# Patient Record
Sex: Male | Born: 1965 | Race: Black or African American | Hispanic: No | Marital: Single | Smoking: Current every day smoker
Health system: Southern US, Community
[De-identification: ages and names within clinical notes are randomized; demographics above are authoritative.]

## PROBLEM LIST (undated history)

## (undated) DIAGNOSIS — K5792 Diverticulitis of intestine, part unspecified, without perforation or abscess without bleeding: Secondary | ICD-10-CM

## (undated) DIAGNOSIS — F329 Major depressive disorder, single episode, unspecified: Secondary | ICD-10-CM

## (undated) DIAGNOSIS — E785 Hyperlipidemia, unspecified: Secondary | ICD-10-CM

## (undated) DIAGNOSIS — M719 Bursopathy, unspecified: Secondary | ICD-10-CM

## (undated) DIAGNOSIS — I10 Essential (primary) hypertension: Secondary | ICD-10-CM

## (undated) DIAGNOSIS — F319 Bipolar disorder, unspecified: Secondary | ICD-10-CM

## (undated) DIAGNOSIS — E119 Type 2 diabetes mellitus without complications: Secondary | ICD-10-CM

## (undated) DIAGNOSIS — M1990 Unspecified osteoarthritis, unspecified site: Secondary | ICD-10-CM

## (undated) DIAGNOSIS — F29 Unspecified psychosis not due to a substance or known physiological condition: Secondary | ICD-10-CM

## (undated) DIAGNOSIS — F32A Depression, unspecified: Secondary | ICD-10-CM

## (undated) HISTORY — DX: Unspecified osteoarthritis, unspecified site: M19.90

## (undated) HISTORY — DX: Bursopathy, unspecified: M71.9

## (undated) HISTORY — DX: Diverticulitis of intestine, part unspecified, without perforation or abscess without bleeding: K57.92

## (undated) HISTORY — DX: Bipolar disorder, unspecified: F31.9

## (undated) HISTORY — DX: Unspecified psychosis not due to a substance or known physiological condition: F29

## (undated) HISTORY — DX: Major depressive disorder, single episode, unspecified: F32.9

## (undated) HISTORY — DX: Depression, unspecified: F32.A

---

## 2015-07-26 DIAGNOSIS — K5730 Diverticulosis of large intestine without perforation or abscess without bleeding: Secondary | ICD-10-CM | POA: Insufficient documentation

## 2015-12-18 DIAGNOSIS — Z2821 Immunization not carried out because of patient refusal: Secondary | ICD-10-CM | POA: Insufficient documentation

## 2017-04-23 DIAGNOSIS — E1110 Type 2 diabetes mellitus with ketoacidosis without coma: Secondary | ICD-10-CM

## 2017-04-23 DIAGNOSIS — N179 Acute kidney failure, unspecified: Secondary | ICD-10-CM

## 2017-04-23 DIAGNOSIS — I10 Essential (primary) hypertension: Secondary | ICD-10-CM

## 2017-05-02 ENCOUNTER — Other Ambulatory Visit: Payer: Self-pay

## 2017-05-02 ENCOUNTER — Encounter: Payer: Self-pay | Admitting: Emergency Medicine

## 2017-05-02 ENCOUNTER — Emergency Department
Admission: EM | Admit: 2017-05-02 | Discharge: 2017-05-02 | Disposition: A | Discharge status: Home / Self Care | Payer: No Typology Code available for payment source | Attending: Emergency Medicine | Admitting: Emergency Medicine

## 2017-05-02 DIAGNOSIS — E119 Type 2 diabetes mellitus without complications: Secondary | ICD-10-CM | POA: Diagnosis not present

## 2017-05-02 DIAGNOSIS — Y9389 Activity, other specified: Secondary | ICD-10-CM | POA: Insufficient documentation

## 2017-05-02 DIAGNOSIS — F17200 Nicotine dependence, unspecified, uncomplicated: Secondary | ICD-10-CM | POA: Diagnosis not present

## 2017-05-02 DIAGNOSIS — M7918 Myalgia, other site: Secondary | ICD-10-CM | POA: Insufficient documentation

## 2017-05-02 DIAGNOSIS — Y92410 Unspecified street and highway as the place of occurrence of the external cause: Secondary | ICD-10-CM | POA: Insufficient documentation

## 2017-05-02 DIAGNOSIS — I10 Essential (primary) hypertension: Secondary | ICD-10-CM | POA: Insufficient documentation

## 2017-05-02 DIAGNOSIS — Y999 Unspecified external cause status: Secondary | ICD-10-CM | POA: Diagnosis not present

## 2017-05-02 DIAGNOSIS — S161XXA Strain of muscle, fascia and tendon at neck level, initial encounter: Secondary | ICD-10-CM | POA: Diagnosis not present

## 2017-05-02 DIAGNOSIS — S39012A Strain of muscle, fascia and tendon of lower back, initial encounter: Secondary | ICD-10-CM | POA: Diagnosis not present

## 2017-05-02 DIAGNOSIS — S1980XA Other specified injuries of unspecified part of neck, initial encounter: Secondary | ICD-10-CM | POA: Diagnosis present

## 2017-05-02 HISTORY — DX: Hyperlipidemia, unspecified: E78.5

## 2017-05-02 HISTORY — DX: Type 2 diabetes mellitus without complications: E11.9

## 2017-05-02 HISTORY — DX: Essential (primary) hypertension: I10

## 2017-05-02 MED ORDER — TRAMADOL HCL 50 MG PO TABS: 50.0000 mg | ORAL_TABLET | Freq: Four times a day (QID) | ORAL | 0 refills | Status: DC | PRN

## 2017-05-02 MED ORDER — CYCLOBENZAPRINE HCL 10 MG PO TABS: 10.0000 mg | ORAL_TABLET | Freq: Three times a day (TID) | ORAL | 0 refills | Status: DC | PRN

## 2017-05-02 MED ORDER — IBUPROFEN 600 MG PO TABS: 600.0000 mg | ORAL_TABLET | Freq: Three times a day (TID) | ORAL | 0 refills | Status: DC | PRN
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
## 2017-07-16 ENCOUNTER — Ambulatory Visit: Payer: Self-pay | Admitting: Family Medicine

## 2017-07-16 VITALS — BP 138/86 | HR 97 | Temp 97.4°F | Wt 273.8 lb

## 2017-07-16 DIAGNOSIS — E782 Mixed hyperlipidemia: Secondary | ICD-10-CM

## 2017-07-16 DIAGNOSIS — E1142 Type 2 diabetes mellitus with diabetic polyneuropathy: Secondary | ICD-10-CM

## 2017-07-16 DIAGNOSIS — M1991 Primary osteoarthritis, unspecified site: Secondary | ICD-10-CM

## 2017-07-16 DIAGNOSIS — I10 Essential (primary) hypertension: Secondary | ICD-10-CM

## 2017-07-16 LAB — GLUCOSE, POCT (MANUAL RESULT ENTRY): POC Glucose: 232 mg/dl — AB (ref 70–99)

## 2017-07-16 MED ORDER — METFORMIN HCL 1000 MG PO TABS: 500.0000 mg | ORAL_TABLET | Freq: Two times a day (BID) | ORAL | 5 refills | Status: DC

## 2017-07-16 MED ORDER — LOSARTAN POTASSIUM 100 MG PO TABS: 100.0000 mg | ORAL_TABLET | Freq: Every day | ORAL | 5 refills | Status: DC

## 2017-07-16 MED ORDER — GLUCOSE BLOOD VI STRP: ORAL_STRIP | 12 refills | Status: DC

## 2017-07-16 MED ORDER — INSULIN ASPART PROT & ASPART (70-30 MIX) 100 UNIT/ML ~~LOC~~ SUSP: 8.0000 [IU] | Freq: Every day | SUBCUTANEOUS | Status: DC

## 2017-07-16 MED ORDER — HYDROCHLOROTHIAZIDE 25 MG PO TABS
25.0000 mg | ORAL_TABLET | Freq: Two times a day (BID) | ORAL | 5 refills | Status: DC
Start: 2017-07-16 — End: 2017-11-20

## 2017-07-16 MED ORDER — ATORVASTATIN CALCIUM 10 MG PO TABS: 10.0000 mg | ORAL_TABLET | Freq: Every day | ORAL | 5 refills | Status: DC

## 2017-07-16 MED ORDER — METOPROLOL TARTRATE 50 MG PO TABS
50.0000 mg | ORAL_TABLET | Freq: Two times a day (BID) | ORAL | 5 refills | Status: DC
Start: 2017-07-16 — End: 2017-11-20

## 2017-07-16 MED ORDER — INSULIN GLARGINE 100 UNIT/ML ~~LOC~~ SOLN: 65.0000 [IU] | Freq: Every day | SUBCUTANEOUS | Status: DC

## 2017-07-16 MED ORDER — AMLODIPINE BESYLATE 10 MG PO TABS: 10.0000 mg | ORAL_TABLET | Freq: Every day | ORAL | 5 refills | Status: DC

## 2017-07-16 NOTE — Addendum Note (Signed)
Addended by: Orvis BrillGREEN, Kayven Aldaco M on: 07/16/2017 07:47 PM   Modules accepted: Orders

## 2017-07-16 NOTE — Progress Notes (Signed)
   Subjective:    Patient ID: Lance Vaughan, male    DOB: 01/16/1966, 52 y.o.   MRN: [id number]  HPI 52 yo AA male just got out of prison--needs refill on chronic meds for TIIDM,HTN HLD and OA of back,hips,knees,shoulder. Wlks with cane.Also needs test strips.   Review of Systems Pain from OA.   On Insulin for 3 months. Recent neuropathy of feet this month.  Objective:   Physical Exam  T--97  Wt 232lbs  Bp -138/86  HR--72  HEENT--WNL COR--RRR Lungs--clear Ext--no edema  .    Assessment & Plan:  TIIDM HTN HLD OA Depression  Get labs,meds refilled for 6 months,RTC 1 months to review labs,BS log. Hope to cut down or get off of insulin if possible.  I have done the exam and reviewed the chart and it is accurate to the best of my knowledge. DentistDragon  technology has been used and  any errors in dictation or transcription are unintentional. Julieanne Mansonichard  M.D. [facility]

## 2017-07-21 ENCOUNTER — Ambulatory Visit: Payer: Self-pay | Admitting: Pharmacy Technician

## 2017-07-21 DIAGNOSIS — Z79899 Other long term (current) drug therapy: Secondary | ICD-10-CM

## 2017-07-21 NOTE — Progress Notes (Signed)
Completed [facility] application and contract.  Patient agreed to all terms of the [facility] contract.   Patient approved to receive medication assistance at [facility] through 2019, as long as eligibility criteria continues to be met.   Provided patient with Civil engineer, contracting based on his particular needs.    Lantus Prescription Application completed with patient.  Sent to [facility] for signature.  Upon receipt of signed Lantus PAP Application from provider, the Lantus PAP Application will be submitted to [employer].  [place] [facility]

## 2017-07-22 ENCOUNTER — Other Ambulatory Visit: Payer: Medicaid Other

## 2017-07-22 ENCOUNTER — Telehealth: Payer: Self-pay

## 2017-07-22 DIAGNOSIS — E119 Type 2 diabetes mellitus without complications: Secondary | ICD-10-CM

## 2017-07-22 NOTE — Telephone Encounter (Signed)
Entered in error

## 2017-07-23 LAB — CBC WITH DIFFERENTIAL
BASOS: 1 %
Basophils Absolute: 0 10*3/uL (ref 0.0–0.2)
EOS (ABSOLUTE): 0.3 10*3/uL (ref 0.0–0.4)
EOS: 6 %
HEMATOCRIT: 44.7 % (ref 37.5–51.0)
Hemoglobin: 15.6 g/dL (ref 13.0–17.7)
IMMATURE GRANS (ABS): 0 10*3/uL (ref 0.0–0.1)
Immature Granulocytes: 0 %
LYMPHS: 40 %
Lymphocytes Absolute: 1.8 10*3/uL (ref 0.7–3.1)
MCH: 32.6 pg (ref 26.6–33.0)
MCHC: 34.9 g/dL (ref 31.5–35.7)
MCV: 93 fL (ref 79–97)
MONOS ABS: 0.3 10*3/uL (ref 0.1–0.9)
Monocytes: 6 %
NEUTROS ABS: 2.1 10*3/uL (ref 1.4–7.0)
Neutrophils: 47 %
RBC: 4.79 x10E6/uL (ref 4.14–5.80)
RDW: 13.6 % (ref 12.3–15.4)
WBC: 4.4 10*3/uL (ref 3.4–10.8)

## 2017-07-23 LAB — COMPREHENSIVE METABOLIC PANEL
ALK PHOS: 70 IU/L (ref 39–117)
ALT: 56 IU/L — ABNORMAL HIGH (ref 0–44)
AST: 30 IU/L (ref 0–40)
Albumin/Globulin Ratio: 1.8 (ref 1.2–2.2)
Albumin: 4.2 g/dL (ref 3.5–5.5)
BILIRUBIN TOTAL: 0.3 mg/dL (ref 0.0–1.2)
BUN / CREAT RATIO: 9 (ref 9–20)
BUN: 10 mg/dL (ref 6–24)
CHLORIDE: 102 mmol/L (ref 96–106)
CO2: 21 mmol/L (ref 20–29)
CREATININE: 1.07 mg/dL (ref 0.76–1.27)
Calcium: 9.1 mg/dL (ref 8.7–10.2)
GFR calc Af Amer: 92 mL/min/{1.73_m2} (ref >59)
GFR calc non Af Amer: 79 mL/min/{1.73_m2} (ref >59)
GLUCOSE: 215 mg/dL — AB (ref 65–99)
Globulin, Total: 2.4 g/dL (ref 1.5–4.5)
Potassium: 4.5 mmol/L (ref 3.5–5.2)
Sodium: 141 mmol/L (ref 134–144)
Total Protein: 6.6 g/dL (ref 6.0–8.5)

## 2017-07-23 LAB — LIPID PANEL
CHOLESTEROL TOTAL: 133 mg/dL (ref 100–199)
Chol/HDL Ratio: 3.7 ratio (ref 0.0–5.0)
HDL: 36 mg/dL — ABNORMAL LOW (ref >39)
LDL CALC: 48 mg/dL (ref 0–99)
TRIGLYCERIDES: 243 mg/dL — AB (ref 0–149)
VLDL CHOLESTEROL CAL: 49 mg/dL — AB (ref 5–40)

## 2017-07-23 LAB — HEMOGLOBIN A1C
Est. average glucose Bld gHb Est-mCnc: 220 mg/dL
Hgb A1c MFr Bld: 9.3 % — ABNORMAL HIGH (ref 4.8–5.6)

## 2017-07-23 LAB — TSH: TSH: 1.19 u[IU]/mL (ref 0.450–4.500)

## 2017-07-24 ENCOUNTER — Telehealth: Payer: Self-pay

## 2017-07-24 NOTE — Telephone Encounter (Signed)
error 

## 2017-08-03 ENCOUNTER — Other Ambulatory Visit: Payer: Self-pay

## 2017-08-03 ENCOUNTER — Telehealth: Payer: Self-pay | Admitting: Pharmacist

## 2017-08-03 ENCOUNTER — Ambulatory Visit: Payer: Medicaid Other | Admitting: Pharmacist

## 2017-08-03 ENCOUNTER — Other Ambulatory Visit: Payer: Self-pay | Admitting: Urology

## 2017-08-03 VITALS — BP 134/84

## 2017-08-03 DIAGNOSIS — Z79899 Other long term (current) drug therapy: Secondary | ICD-10-CM

## 2017-08-03 NOTE — Telephone Encounter (Signed)
Called and talked to patient. Counseled on stopping Celexa and starting Zoloft. Recommended not tapering due to patient inconsistently taking Celexa the past 2 weeks.   Lance CedarStephanie Meghan Vaughan, PharmD Pharmacy Resident

## 2017-08-03 NOTE — Progress Notes (Signed)
[facility] Visit Note  Patient: Lance Vaughan MRN: [id number] Date of Birth: [birth year] PCP: Patient, No Pcp Per   Lance Vaughan 52 y.o. male presents for an initial [facility] visit today.  [facility] chief complaint of signs/symptoms of high blood sugar and shoulder/back pain. Patient brought in medication bottles and insulin pens for review. Admits to missing medication doses ~2x/week.  Patient Information   Past Medical History:  Diagnosis Date  . Depression   . Diabetes mellitus without complication (HCC)   . Diverticulitis   . Hyperlipidemia   . Hypertension   . Psychosis (HCC)      History reviewed. No pertinent surgical history.   Family History  Problem Relation Age of Onset  . Diabetes Mother   . Hypertension Mother   . Cancer Mother   . Depression Mother   . Hypertension Sister   . Hyperlipidemia Sister   . Hyperlipidemia Sister   . Hypertension Sister     Family Support: Good- Patient states has family member at home that helps keep up with his medications  Diet: Breakfast: Left overs from dinner the night before=fried chicken, spaghetti, bologna sandwhich Lunch:Same as breakfast Dinner:Same as breakfast; sometimes cheeseburgers  Drinks:water, occasional soda 1-2x/week     Current Exercise Habits: The patient does not participate in regular exercise at present due to body pain and lower extremity neuropathy.     Social History   Substance and Sexual Activity  Alcohol Use No  . Frequency: Never      Social History   Tobacco Use  Smoking Status Current Every Day Smoker  . Packs/day: 1.00  . Years: 30.00  . Pack years: 30.00  Smokeless Tobacco Never Used      Health Maintenance  Topic Date Due  . PNEUMOCOCCAL POLYSACCHARIDE VACCINE (1) 07/14/1967  . FOOT EXAM  07/14/1975  . OPHTHALMOLOGY EXAM  07/14/1975  . HIV Screening  07/13/1980  . TETANUS/TDAP  07/13/1984  . COLONOSCOPY   07/14/2015  . INFLUENZA VACCINE  01/21/2017  . HEMOGLOBIN A1C  01/19/2018    Assessment and Plan:  1. Medication Compliance: States misses ~2 doses/week of medications.   2. Diabetes Type II: Uncontrolled with most recent A1c 9.3% on 07/22/17. Current regimen=metformin, Lantus 65 units and Novolog 8 units with dinner. Patient checks BG 1-2 x/day with FBG 160-170 mg/dL and PPBG 409-811260-270 mg/dL. Counseled patient on goals of FBG 90-130 mg/dL and PPBG <914<180 mg/dL. Patient recently experiencing signs and symptoms of high BG such as blurry vision, foggy brain, neuropathy in feet, polydipsia and polyuria. Educated on importance of meeting BG goals to prevent permanent damage. Discussed importance of diet healthy lifestyle changes such as cutting back on bread and smoking cessation.--Currently working on receiving Lantus from P[facility]. Contacted [facility] about bridging with Levemir 65 units. Also recommended switching from Novolog to Apidra 8 units nightly.   3. Depression/Psychosis: Patient states was on Zoloft before prison and was transitioned to Celexa while in prison. Since leaving prison, has obtained a Zoloft prescription [facility]. Patient says Celexa did not work for him and has been inconsistently taking the last 2 weeks. Called patient on phone to stop Celexa and start taking Zoloft--did not see need to taper since patient taking inconsistently.   4. Hypertension: Current regimen=HCTZ, amlodipine, losartan, metoprolol. Patient reported BP 134/84 mmHg this AM. Patient reports checking BP at home once in a while. Says BP sometimes runs low and questioned how low  can BP go before it becomes problemtatic. Discussed with patient goal BP <140/90 mmHg and if lower and experiencing signs/symptoms of low BP to let provider know. Patient reports having 2 lows last year that resulted in passing out (~100/70 mmHg).  5. Smoking Cessation: Current 1ppd smoker for past 30 years. Would like to quit and has  previously tried nicotine gum, which did not work. Upon further investigation, patient did not use nicotine gum correctly and discussed the appropriate way to use the gum with the "park and chew" method. Provided patient with handout on smoking cessation program.    5. Hyperlipidemia: Current regimen atorvastatin 10mg  every day at night. Most recent lipid panel 07/22/17: TC 133, TG 243, HDL 36, LDL 49. Will continue on atorvastatin and life style modifications.    Will see patient for follow up [facility] visit in ~1-2 months or sooner if any questions or concerns arise.   Cleopatra Cedar, PharmD Pharmacy Resident

## 2017-08-13 ENCOUNTER — Other Ambulatory Visit: Payer: Self-pay | Admitting: Adult Health Nurse Practitioner

## 2017-08-13 MED ORDER — INSULIN DETEMIR 100 UNIT/ML FLEXPEN: 65.0000 [IU] | Freq: Every day | SUBCUTANEOUS | 3 refills | Status: DC

## 2017-08-13 MED ORDER — INSULIN GLULISINE 100 UNIT/ML IJ SOLN: 8.0000 [IU] | Freq: Every evening | INTRAMUSCULAR | 11 refills | Status: DC

## 2017-08-18 ENCOUNTER — Telehealth: Payer: Self-pay | Admitting: Pharmacist

## 2017-08-18 NOTE — Telephone Encounter (Signed)
08/18/2017 1:28:34 PM - Levemir not ordering already on Lantus  08/18/17 I received a pharmacy printout for Levemir Vials Inject 65 units under the skin at bedtime. I saw that patient is already on Lantus Solostar, I discussed with Christan-keep the order as Lantus Solostar-not ordering Levemir.AJ  08/18/2017 1:26:56 PM - Apidra Solostar  08/18/17 Received a pharmacy printout for Apidra Inject 8 units under the skin every evening before dinner, I verified with Christan to order Apidra Solostar since we are already ordering Lantus Solostar for patient, she gave the OK. Printed [employer] [facility].Forde RadonAJ

## 2017-08-20 ENCOUNTER — Encounter: Payer: Self-pay | Admitting: Adult Health Nurse Practitioner

## 2017-08-20 ENCOUNTER — Ambulatory Visit: Payer: Medicaid Other | Admitting: Adult Health Nurse Practitioner

## 2017-08-20 ENCOUNTER — Other Ambulatory Visit: Payer: Self-pay | Admitting: Adult Health Nurse Practitioner

## 2017-08-20 DIAGNOSIS — E785 Hyperlipidemia, unspecified: Secondary | ICD-10-CM | POA: Insufficient documentation

## 2017-08-20 DIAGNOSIS — E1140 Type 2 diabetes mellitus with diabetic neuropathy, unspecified: Secondary | ICD-10-CM | POA: Insufficient documentation

## 2017-08-20 DIAGNOSIS — E119 Type 2 diabetes mellitus without complications: Secondary | ICD-10-CM

## 2017-08-20 DIAGNOSIS — I10 Essential (primary) hypertension: Secondary | ICD-10-CM | POA: Insufficient documentation

## 2017-08-20 DIAGNOSIS — E782 Mixed hyperlipidemia: Secondary | ICD-10-CM

## 2017-08-20 MED ORDER — INSULIN GLULISINE 100 UNIT/ML IJ SOLN: 8.0000 [IU] | Freq: Two times a day (BID) | INTRAMUSCULAR | 11 refills | Status: DC

## 2017-08-20 MED ORDER — METFORMIN HCL 1000 MG PO TABS: 1000.0000 mg | ORAL_TABLET | Freq: Two times a day (BID) | ORAL | 5 refills | Status: DC

## 2017-08-20 NOTE — Progress Notes (Signed)
Subjective:    Patient ID: Lance Vaughan, male    DOB: 24-Jul-1965, 52 y.o.   MRN: [id number]  HPI  Lance Vaughan is a 52 yo male here for lab review and presents with severe generalized pain.  Diabetes: last A1c was 9.7. Pt is taking 1000mg  Metformin, 65 units of Lantus, 8 units of Apidra daily.Pt self reports sugars at 400-500 once a week. Pt is checking sugars 3 times/day but is running out of test strips before he can get more. Pt reports 2 times in 1 month hyperglycemia.  Pt was started on Lithium yesterday from [facility] for Bipolar disorder but pt hasn't filled Rx yet.  Hypertension: pt reports severe edema in feet which is unusual but had just ate salty foods.   There are no active problems to display for this patient.  Allergies as of 08/20/2017      Reactions   Lisinopril Other (See Comments)   Chest pain      Medication List        Accurate as of 08/20/17  7:04 PM. Always use your most recent med list.          amLODipine 10 MG tablet Commonly known as:  NORVASC Take 1 tablet (10 mg total) by mouth daily.   aspirin EC 81 MG tablet Take 81 mg by mouth daily.   atorvastatin 10 MG tablet Commonly known as:  LIPITOR Take 1 tablet (10 mg total) by mouth daily.   citalopram 10 MG tablet Commonly known as:  CELEXA Take 10 mg by mouth daily.   cyclobenzaprine 10 MG tablet Commonly known as:  FLEXERIL Take 1 tablet (10 mg total) 3 (three) times daily as needed by mouth.   glucose blood test strip Use as instructed   haloperidol 10 MG tablet Commonly known as:  HALDOL Take 10 mg by mouth 1 day or 1 dose.   hydrochlorothiazide 25 MG tablet Commonly known as:  HYDRODIURIL Take 1 tablet (25 mg total) by mouth 2 (two) times daily.   ibuprofen 600 MG tablet Commonly known as:  ADVIL,MOTRIN Take 1 tablet (600 mg total) every 8 (eight) hours as needed by mouth.   insulin detemir 100 unit/ml Soln Commonly known as:  LEVEMIR Inject 0.65 mLs (65 Units total) into the skin  at bedtime.   insulin glulisine 100 UNIT/ML injection Commonly known as:  APIDRA Inject 0.08 mLs (8 Units total) into the skin every evening. Before dinner   lithium carbonate 150 MG capsule Take 10 mg by mouth 1 day or 1 dose.   losartan 100 MG tablet Commonly known as:  COZAAR Take 1 tablet (100 mg total) by mouth daily.   metFORMIN 1000 MG tablet Commonly known as:  GLUCOPHAGE Take 0.5 tablets (500 mg total) by mouth 2 (two) times daily with a meal.   metoprolol tartrate 50 MG tablet Commonly known as:  LOPRESSOR Take 1 tablet (50 mg total) by mouth 2 (two) times daily.   sertraline 25 MG tablet Commonly known as:  ZOLOFT Take 10 mg by mouth daily.        Review of Systems Reviewed labs. LDL well controlled. A1c was 9.7.     Objective:   Physical Exam  Constitutional: He is oriented to person, place, and time. He appears well-developed and well-nourished.  Cardiovascular: Normal rate, regular rhythm and normal heart sounds.  Pulmonary/Chest: Effort normal and breath sounds normal.  Abdominal: Soft. Bowel sounds are normal.  Neurological: He is alert and oriented to person,  place, and time.  Vitals reviewed.   BP 138/90   Pulse 98   Temp 97.8 F (36.6 C)   Wt 275 lb 12.8 oz (125.1 kg)   BMI 37.41 kg/m        Assessment & Plan:   Diabetes: Increase Apidra to 8 units BID. Recommended pt check sugars twice daily. Encourage heart healthy diet and to decrease salt intake. Discouraged walking barefoot, check feet regularly.   Bipolar disorder: contacting [facility] to request change from Lithium to something else.    F/u in 3 mo w/ labs

## 2017-08-21 ENCOUNTER — Telehealth: Payer: Self-pay

## 2017-08-21 NOTE — Telephone Encounter (Signed)
Faxed signed medical release form to [facility]  Lance Vaughan wants to discuss medication therapy with dotor as it is effecting patients HTN.

## 2017-08-31 ENCOUNTER — Telehealth: Payer: Self-pay | Admitting: Pharmacist

## 2017-08-31 NOTE — Telephone Encounter (Signed)
08/31/2017 11:04:33 AM - Apidra Solostar & Lantus Solostar  08/31/17 I have faxed [employer] application for DIRECTVpidra Solostar Inject 8 units twice daily with meals #1, and Lantus Solostar Inject 65 units under the skin each morning #4, for enrollment.Forde RadonAJ

## 2017-10-29 ENCOUNTER — Emergency Department
Admission: EM | Admit: 2017-10-29 | Discharge: 2017-10-29 | Disposition: A | Discharge status: Home / Self Care | Payer: Medicaid Other | Attending: Emergency Medicine | Admitting: Emergency Medicine

## 2017-10-29 ENCOUNTER — Other Ambulatory Visit: Payer: Self-pay

## 2017-10-29 ENCOUNTER — Encounter: Payer: Self-pay | Admitting: Emergency Medicine

## 2017-10-29 DIAGNOSIS — Z7982 Long term (current) use of aspirin: Secondary | ICD-10-CM | POA: Insufficient documentation

## 2017-10-29 DIAGNOSIS — K0889 Other specified disorders of teeth and supporting structures: Secondary | ICD-10-CM | POA: Diagnosis present

## 2017-10-29 DIAGNOSIS — I10 Essential (primary) hypertension: Secondary | ICD-10-CM | POA: Insufficient documentation

## 2017-10-29 DIAGNOSIS — K047 Periapical abscess without sinus: Secondary | ICD-10-CM

## 2017-10-29 DIAGNOSIS — Z79899 Other long term (current) drug therapy: Secondary | ICD-10-CM | POA: Insufficient documentation

## 2017-10-29 DIAGNOSIS — E119 Type 2 diabetes mellitus without complications: Secondary | ICD-10-CM | POA: Diagnosis not present

## 2017-10-29 DIAGNOSIS — Z794 Long term (current) use of insulin: Secondary | ICD-10-CM | POA: Diagnosis not present

## 2017-10-29 DIAGNOSIS — F17210 Nicotine dependence, cigarettes, uncomplicated: Secondary | ICD-10-CM | POA: Diagnosis not present

## 2017-10-29 MED ORDER — AMOXICILLIN 500 MG PO CAPS: 500.0000 mg | ORAL_CAPSULE | Freq: Three times a day (TID) | ORAL | 0 refills | Status: DC

## 2017-10-29 MED ORDER — NAPROXEN 500 MG PO TABS: 500.0000 mg | ORAL_TABLET | Freq: Two times a day (BID) | ORAL | 0 refills | Status: DC

## 2017-10-29 NOTE — ED Triage Notes (Signed)
Patient here with swelling of right side of face, complaining of dental pain right lower jaw since day before yesterday.  Alert and oriented.  Afebrile.

## 2017-10-29 NOTE — ED Provider Notes (Signed)
[facility] Emergency Department Provider Note  ____________________________________________   First MD Initiated Contact with Patient 10/29/17 0815     (approximate)  I have reviewed the triage vital signs and the nursing notes.   HISTORY  Chief Complaint Abscess and Oral Swelling   HPI Lance Vaughan is a 52 y.o. male is here with complaint of right posterior molar pain.  Patient states that it began hurting yesterday.  He denies any fever or chills.  He states that [facility]  He also states that he takes his blood pressure medication every day despite his elevated blood pressure at this time.  He rates his pain as a 10/10.  Past Medical History:  Diagnosis Date  . Depression   . Diabetes mellitus without complication (HCC)   . Diverticulitis   . Hyperlipidemia   . Hypertension   . Psychosis [facility])     Patient Active Problem List   Diagnosis Date Noted  . Hyperlipidemia 08/20/2017  . Diabetes mellitus without complication (HCC) 08/20/2017  . Hypertension 08/20/2017    History reviewed. No pertinent surgical history.  Prior to Admission medications   Medication Sig Start Date End Date Taking? Authorizing Provider  amLODipine (NORVASC) 10 MG tablet Take 1 tablet (10 mg total) by mouth daily. 07/16/17   Maple Hudson., MD  amoxicillin (AMOXIL) 500 MG capsule Take 1 capsule (500 mg total) by mouth 3 (three) times daily. 10/29/17   Tommi Rumps, PA-C  aspirin EC 81 MG tablet Take 81 mg by mouth daily.    [provider]  atorvastatin (LIPITOR) 10 MG tablet Take 1 tablet (10 mg total) by mouth daily. 07/16/17   Maple Hudson., MD  citalopram (CELEXA) 10 MG tablet Take 10 mg by mouth daily.    [provider]  glucose blood test strip Use as instructed 07/16/17   Maple Hudson., MD  haloperidol (HALDOL) 10 MG tablet Take 10 mg by mouth 1 day or 1 dose.    [provider]  hydrochlorothiazide (HYDRODIURIL) 25 MG tablet Take 1 tablet (25 mg total) by mouth 2 (two) times daily. 07/16/17   Maple Hudson., MD  insulin detemir (LEVEMIR) 100 unit/ml SOLN Inject 0.65 mLs (65 Units total) into the skin at bedtime. 08/13/17   Doles-Johnson, Teah, NP  insulin glulisine (APIDRA) 100 UNIT/ML injection Inject 0.08 mLs (8 Units total) into the skin 2 (two) times daily after a meal. Before dinner 08/20/17   Doles-Johnson, Teah, NP  lithium carbonate 150 MG capsule Take 10 mg by mouth 1 day or 1 dose.    [provider]  losartan (COZAAR) 100 MG tablet Take 1 tablet (100 mg total) by mouth daily. 07/16/17   Maple Hudson., MD  metFORMIN (GLUCOPHAGE) 1000 MG tablet Take 1 tablet (1,000 mg total) by mouth 2 (two) times daily with a meal. 08/20/17   Doles-Johnson, Teah, NP  metoprolol tartrate (LOPRESSOR) 50 MG tablet Take 1 tablet (50 mg total) by mouth 2 (two) times daily. 07/16/17   Maple Hudson., MD  naproxen (NAPROSYN) 500 MG tablet Take 1 tablet (500 mg total) by mouth 2 (two) times daily with a meal. 10/29/17   Bridget Hartshorn L, PA-C  sertraline (ZOLOFT) 25 MG tablet Take 10 mg by mouth daily.    [provider]    Allergies Lisinopril  Family History  Problem Relation Age of Onset  .  Diabetes Mother   . Hypertension Mother   . Cancer Mother   . Depression Mother   . Hypertension Sister   . Hyperlipidemia Sister   . Hyperlipidemia Sister   . Hypertension Sister     Social History Social History   Tobacco Use  . Smoking status: Current Every Day Smoker    Packs/day: 1.00    Years: 30.00    Pack years: 30.00  . Smokeless tobacco: Never Used  Substance Use Topics  . Alcohol use: No    Frequency: Never  . Drug use: No    Review of Systems Constitutional: No fever/chills Eyes: No visual changes. ENT: No sore throat.  Positive dental pain. Cardiovascular: Denies chest pain. Respiratory: Denies shortness of  breath. Musculoskeletal: Negative for back pain. Neurological: Negative for headaches ____________________________________________   PHYSICAL EXAM:  VITAL SIGNS: ED Triage Vitals  Enc Vitals Group     BP 10/29/17 0804 (!) 156/106     Pulse Rate 10/29/17 0804 77     Resp 10/29/17 0804 18     Temp 10/29/17 0804 98.3 F (36.8 C)     Temp Source 10/29/17 0804 Oral     SpO2 10/29/17 0804 98 %     Weight 10/29/17 0805 272 lb (123.4 kg)     Height 10/29/17 0805 6' (1.829 m)     Head Circumference --      Peak Flow --      Pain Score 10/29/17 0805 10     Pain Loc --      Pain Edu? --      Excl. in GC? --    Constitutional: Alert and oriented. Well appearing and in no acute distress. Eyes: Conjunctivae are normal. PERRL. EOMI. Head: Atraumatic. Nose: No congestion/rhinnorhea. Mouth/Throat: Mucous membranes are moist.  Oropharynx non-erythematous.  Gums to the posterior molars is edematous and extremely tender to touch.  There is no obvious abscess noted.  Teeth are in poor hygiene. Neck: No stridor.   Cardiovascular: Normal rate, regular rhythm. Grossly normal heart sounds.  Good peripheral circulation. Respiratory: Normal respiratory effort.  No retractions. Lungs CTAB. Musculoskeletal: Moves upper and lower extremities without any difficulty.  Normal gait was noted. Neurologic:  Normal speech and language. No gross focal neurologic deficits are appreciated.  Skin:  Skin is warm, dry and intact.  Psychiatric: Mood and affect are normal. Speech and behavior are normal.  ____________________________________________   LABS (all labs ordered are listed, but only abnormal results are displayed)  Labs Reviewed - No data to display   PROCEDURES  Procedure(s) performed: None  Procedures  Critical Care performed: No  ____________________________________________   INITIAL IMPRESSION / ASSESSMENT AND PLAN / ED COURSE  As part of my medical decision making, I reviewed the  following data within the electronic MEDICAL RECORD NUMBER Notes from prior ED visits and  Controlled Substance Database  Patient is encouraged to call make an appointment with his dentist as soon as possible.  Patient was given a prescription for amoxicillin 500 mg 3 times daily for 10 days and naproxen 500 mg twice daily with food.  Patient was discharged in stable condition and encouraged to continue taking his blood pressure medication daily.  ____________________________________________   FINAL CLINICAL IMPRESSION(S) / ED DIAGNOSES  Final diagnoses:  Dental abscess     ED Discharge Orders        Ordered    amoxicillin (AMOXIL) 500 MG capsule  3 times daily     10/29/17 0857  naproxen (NAPROSYN) 500 MG tablet  2 times daily with meals     10/29/17 0857       Note:  This document was prepared using Dragon voice recognition software and may include unintentional dictation errors.    Tommi Rumps, PA-C 10/29/17 1056    Don Perking, [place], MD 10/29/17 1057

## 2017-10-29 NOTE — Discharge Instructions (Addendum)
Call make an appointment with your dentist.  Begin taking amoxicillin 500 mg 3 times daily for 10 days.  Naproxen 500 mg twice daily as needed for pain.  You may continue taking Tylenol with this medication if needed.  OPTIONS FOR DENTAL FOLLOW UP CARE  [place] Department of Health and Human Services - Local Safety Net Dental Clinics [email or web address]   [facility] [phone])  [facility] [phone])  [place] [phone])  [facility] [phone])  [facility] [phone]) This clinic caters to the indigent population and is on a lottery system. Location: [employer] [facility], [employer], [address] [place] Clinic Hours: Wednesdays from 6pm - 9pm, patients seen by a lottery system. For dates, call or go to [email or web address] Services: Cleanings, fillings and simple extractions. Payment Options: DENTAL WORK IS FREE OF CHARGE. Bring proof of income or support. Best way to get seen: Arrive at 5:15 pm - this is a lottery, NOT first come/first serve, so arriving earlier will not increase your chances of being seen.     [facility] [phone] Select option 1 for emergencies   Location: [facility], [place], [address] [place] Clinic Hours: No walk-ins accepted - call the day before to schedule an appointment. Check in times are 9:30 am and 1:30 pm. Services: Simple extractions, temporary fillings, pulpectomy/pulp debridement, uncomplicated abscess drainage. Payment Options: PAYMENT IS DUE AT THE TIME OF SERVICE.  Fee is usually $100-200, additional surgical procedures (e.g. abscess drainage) may be extra. Cash, checks, Visa/MasterCard accepted.  Can file Medicaid if patient is covered for dental - patient should call case worker to check. No discount for [facility] patients. Best way to get seen: MUST call the day before and get onto the schedule. Can usually be seen the next 1-2 days. No walk-ins accepted.     [facility] [phone]   Location: [facility], [address] [place] Clinic Hours: M, W, Th, F 8am or 1:30pm, Tues 9a or 1:30 - first come/first served. Services: Simple extractions, temporary fillings, uncomplicated abscess drainage.  You do not need to be an [facility] resident. Payment Options: PAYMENT IS DUE AT THE TIME OF SERVICE. Dental insurance, otherwise sliding scale - bring proof of income or support. Depending on income and treatment needed, cost is usually $50-200. Best way to get seen: Arrive early as it is first come/first served.     [facility] [phone]   Location: [address] Clinic Hours: Mon-Thu 8a-5p Services: Most basic dental services including extractions and fillings. Payment Options: PAYMENT IS DUE AT THE TIME OF SERVICE. Sliding scale, up to 50% off - bring proof if income or support. Medicaid with dental option accepted. Best way to get seen: Call to schedule an appointment, can usually be seen within 2 weeks OR they will try to see walk-ins - show up at 8a or 2p (you may have to wait).     [facility] [phone] [place] RESIDENTS ONLY   Location: [facility], [address], [place], [place] [address] Clinic Hours: By appointment only. Monday - Thursday 8am-5pm, Friday 8am-12pm Services: Cleanings, fillings, extractions. Payment Options: PAYMENT IS DUE AT THE TIME OF SERVICE. Cash, Visa or MasterCard. Sliding scale - $30 minimum per service. Best way to get seen: Come in to office, complete packet and make an appointment -  need proof of income or support monies for each household member and proof of [facility] residence. Usually takes  about a month to get in.     [facility] [phone]   Location: [address], [facility] Clinic Hours: Walk-in Urgent Care Dental Services are offered Monday-Friday mornings only. The numbers of emergencies accepted daily is limited to the number of providers available. Maximum 15 - Mondays, Wednesdays & Thursdays Maximum 10 - Tuesdays & Fridays Services: You do not need to be a [facility] resident to be seen for a dental emergency. Emergencies are defined as pain, swelling, abnormal bleeding, or dental trauma. Walkins will receive x-rays if needed. NOTE: Dental cleaning is not an emergency. Payment Options: PAYMENT IS DUE AT THE TIME OF SERVICE. Minimum co-pay is $40.00 for uninsured patients. Minimum co-pay is $3.00 for Medicaid with dental coverage. Dental Insurance is accepted and must be presented at time of visit. Medicare does not cover dental. Forms of payment: Cash, credit card, checks. Best way to get seen: If not previously registered with the clinic, walk-in dental registration begins at 7:15 am and is on a first come/first serve basis. If previously registered with the clinic, call to make an appointment.     [facility] [phone] [place] RESIDENTS ONLY   Location: [address], [place], [place] Clinic Hours: Mon-Thu 10a-2p Services: Extractions only! Payment Options: FREE (donations accepted) - bring proof of income or support Best way to get seen: Call and schedule an appointment OR come at 8am on the 1st Monday of every month (except for holidays) when it is first come/first served.     [facility] [phone]   Location: [address] [place], [place] Clinic Hours: Friday mornings Services, Payment Options, Best way to get seen: Call for info

## 2017-11-12 ENCOUNTER — Other Ambulatory Visit: Payer: Medicaid Other

## 2017-11-12 DIAGNOSIS — E119 Type 2 diabetes mellitus without complications: Secondary | ICD-10-CM

## 2017-11-13 LAB — HEMOGLOBIN A1C
ESTIMATED AVERAGE GLUCOSE: 209 mg/dL
Hgb A1c MFr Bld: 8.9 % — ABNORMAL HIGH (ref 4.8–5.6)

## 2017-11-13 LAB — COMPREHENSIVE METABOLIC PANEL
A/G RATIO: 1.9 (ref 1.2–2.2)
ALK PHOS: 65 IU/L (ref 39–117)
ALT: 35 IU/L (ref 0–44)
AST: 19 IU/L (ref 0–40)
Albumin: 3.8 g/dL (ref 3.5–5.5)
BUN / CREAT RATIO: 12 (ref 9–20)
BUN: 15 mg/dL (ref 6–24)
CO2: 21 mmol/L (ref 20–29)
Calcium: 8.8 mg/dL (ref 8.7–10.2)
Chloride: 105 mmol/L (ref 96–106)
Creatinine, Ser: 1.22 mg/dL (ref 0.76–1.27)
GFR, EST AFRICAN AMERICAN: 78 mL/min/{1.73_m2} (ref >59)
GFR, EST NON AFRICAN AMERICAN: 68 mL/min/{1.73_m2} (ref >59)
GLUCOSE: 284 mg/dL — AB (ref 65–99)
Globulin, Total: 2 g/dL (ref 1.5–4.5)
POTASSIUM: 4.4 mmol/L (ref 3.5–5.2)
SODIUM: 141 mmol/L (ref 134–144)
Total Protein: 5.8 g/dL — ABNORMAL LOW (ref 6.0–8.5)

## 2017-11-19 ENCOUNTER — Ambulatory Visit: Payer: Medicaid Other | Admitting: Family Medicine

## 2017-11-19 VITALS — BP 144/86 | HR 88 | Ht 71.75 in | Wt 270.9 lb

## 2017-11-19 DIAGNOSIS — E782 Mixed hyperlipidemia: Secondary | ICD-10-CM

## 2017-11-19 DIAGNOSIS — Z09 Encounter for follow-up examination after completed treatment for conditions other than malignant neoplasm: Secondary | ICD-10-CM

## 2017-11-19 DIAGNOSIS — M545 Low back pain: Principal | ICD-10-CM

## 2017-11-19 DIAGNOSIS — G8929 Other chronic pain: Secondary | ICD-10-CM

## 2017-11-19 DIAGNOSIS — E1142 Type 2 diabetes mellitus with diabetic polyneuropathy: Secondary | ICD-10-CM

## 2017-11-19 DIAGNOSIS — H539 Unspecified visual disturbance: Secondary | ICD-10-CM

## 2017-11-19 DIAGNOSIS — I10 Essential (primary) hypertension: Secondary | ICD-10-CM

## 2017-11-19 NOTE — Progress Notes (Signed)
Subjective:     Patient ID: Lance Vaughan, male   DOB: Apr 03, 1966, 52 y.o.   MRN: [id number]   PCP: Raliegh Ip, NP  Chief Complaint  Patient presents with  . Medication Refill    Interested in refill for HTN and DM medications, patient is also interested in scheduling eye exam     HPI  Lance Vaughan has a history of Psychosis, Hypertension, Hyperlipidemia, Diabetes, Depression, and Bipolar Disorder.  He is here today for follow up and refills.   Current Status: Since his last office visit, he states that he is doing well, and has good energy. He occasionally has headache and dizziness in the morning. He uses Naproxen to relieve headaches. He has had some recent vision changes. He denies fevers, chills, fatigue, weight loss, and night sweats. He denies cough, chest pain, heart palpitations, and shortness of breath.   He has a good appetite. He denies abdominal pain, nausea, vomiting, diarrhea, and constipation.  His anxiety is stable.    He has chronic back pain. He states that back pain is worsening.   Past Medical History:  Diagnosis Date  . Bipolar disorder (HCC)   . Depression   . Diabetes mellitus without complication (HCC)   . Diverticulitis   . Hyperlipidemia   . Hypertension   . Psychosis (HCC)   . Psychosis (HCC)     Family History  Problem Relation Age of Onset  . Diabetes Mother   . Hypertension Mother   . Cancer Mother   . Depression Mother   . Hypertension Sister   . Hyperlipidemia Sister   . Hyperlipidemia Sister   . Hypertension Sister     Social History   Socioeconomic History  . Marital status: Single    Spouse name: Not on file  . Number of children: 4  . Years of education: Not on file  . Highest education level: GED or equivalent  Occupational History  . Occupation: disability  Social Needs  . Financial resource strain: Very hard  . Food insecurity:    Worry: Often true    Inability: Sometimes true  . Transportation needs:    Medical:  Yes    Non-medical: No  Tobacco Use  . Smoking status: Current Every Day Smoker    Packs/day: 0.50    Years: 30.00    Pack years: 15.00  . Smokeless tobacco: Never Used  Substance and Sexual Activity  . Alcohol use: No    Frequency: Never  . Drug use: No  . Sexual activity: Never  Lifestyle  . Physical activity:    Days per week: 3 days    Minutes per session: 10 min  . Stress: Rather much  Relationships  . Social connections:    Talks on phone: Once a week    Gets together: Never    Attends religious service: Never    Active member of club or organization: No    Attends meetings of clubs or organizations: Never    Relationship status: Separated  . Intimate partner violence:    Fear of current or ex partner: No    Emotionally abused: No    Physically abused: No    Forced sexual activity: No  Other Topics Concern  . Not on file  Social History Narrative   Patient is currently living with his ex-wife, but is looking for other housing options.  He gets food when he has a ride to the foodbank.     History reviewed. No pertinent surgical history.  There is no immunization history on file for this patient.   Current Meds  Medication Sig  . amLODipine (NORVASC) 10 MG tablet Take 1 tablet (10 mg total) by mouth daily.  Marland Kitchen aspirin EC 81 MG tablet Take 81 mg by mouth daily.  Marland Kitchen atorvastatin (LIPITOR) 10 MG tablet Take 1 tablet (10 mg total) by mouth daily.  Marland Kitchen glucose blood test strip Use as instructed  . haloperidol (HALDOL) 10 MG tablet Take 10 mg by mouth 1 day or 1 dose.  . hydrochlorothiazide (HYDRODIURIL) 25 MG tablet Take 1 tablet (25 mg total) by mouth 2 (two) times daily.  . insulin detemir (LEVEMIR) 100 unit/ml SOLN Inject 0.65 mLs (65 Units total) into the skin at bedtime.  . insulin glulisine (APIDRA) 100 UNIT/ML injection Inject 0.08 mLs (8 Units total) into the skin 2 (two) times daily after a meal. Before dinner  . losartan (COZAAR) 100 MG tablet Take 1 tablet  (100 mg total) by mouth daily.  . metFORMIN (GLUCOPHAGE) 1000 MG tablet Take 1 tablet (1,000 mg total) by mouth 2 (two) times daily with a meal.  . metoprolol tartrate (LOPRESSOR) 50 MG tablet Take 1 tablet (50 mg total) by mouth 2 (two) times daily.  . naproxen (NAPROSYN) 500 MG tablet Take 1 tablet (500 mg total) by mouth 2 (two) times daily with a meal.  . sertraline (ZOLOFT) 25 MG tablet Take 10 mg by mouth daily.  . [DISCONTINUED] amLODipine (NORVASC) 10 MG tablet Take 1 tablet (10 mg total) by mouth daily.  . [DISCONTINUED] atorvastatin (LIPITOR) 10 MG tablet Take 1 tablet (10 mg total) by mouth daily.  . [DISCONTINUED] hydrochlorothiazide (HYDRODIURIL) 25 MG tablet Take 1 tablet (25 mg total) by mouth 2 (two) times daily.  . [DISCONTINUED] insulin detemir (LEVEMIR) 100 unit/ml SOLN Inject 0.65 mLs (65 Units total) into the skin at bedtime.  . [DISCONTINUED] insulin glulisine (APIDRA) 100 UNIT/ML injection Inject 0.08 mLs (8 Units total) into the skin 2 (two) times daily after a meal. Before dinner  . [DISCONTINUED] losartan (COZAAR) 100 MG tablet Take 1 tablet (100 mg total) by mouth daily.  . [DISCONTINUED] metFORMIN (GLUCOPHAGE) 1000 MG tablet Take 1 tablet (1,000 mg total) by mouth 2 (two) times daily with a meal.  . [DISCONTINUED] metoprolol tartrate (LOPRESSOR) 50 MG tablet Take 1 tablet (50 mg total) by mouth 2 (two) times daily.  . [DISCONTINUED] naproxen (NAPROSYN) 500 MG tablet Take 1 tablet (500 mg total) by mouth 2 (two) times daily with a meal.   Current Facility-Administered Medications for the 11/19/17 encounter (Office Visit) with Kallie Locks, FNP  Medication  . insulin aspart protamine- aspart (NOVOLOG MIX 70/30) injection 8 Units  . insulin glargine (LANTUS) injection 65 Units    Allergies  Allergen Reactions  . Lisinopril Other (See Comments)    Chest pain    BP (!) 144/86   Pulse 88   Ht 5' 11.75" (1.822 m)   Wt 270 lb 15.1 oz (122.9 kg)   BMI 37.00 kg/m      Review of Systems  Eyes: Negative.   Respiratory: Negative.   Cardiovascular: Negative.   Gastrointestinal: Negative.   Endocrine: Negative.   Genitourinary: Negative.   Musculoskeletal: Negative.   Skin: Negative.   Allergic/Immunologic: Negative.   Neurological: Positive for headaches.  Hematological: Negative.   Psychiatric/Behavioral: Negative.    Objective:   Physical Exam  Constitutional: He is oriented to person, place, and time. He appears well-developed and well-nourished.  HENT:  Head: Normocephalic and atraumatic.  Right Ear: External ear normal.  Left Ear: External ear normal.  Nose: Nose normal.  Mouth/Throat: Oropharynx is clear and moist.  Eyes: Pupils are equal, round, and reactive to light. Conjunctivae and EOM are normal.  Neck: Normal range of motion. Neck supple.  Cardiovascular: Normal rate, regular rhythm, normal heart sounds and intact distal pulses.  Pulmonary/Chest: Effort normal and breath sounds normal.  Abdominal: Soft. Bowel sounds are normal.  Musculoskeletal:  Chronic lower back pain.  Neurological: He is alert and oriented to person, place, and time.  Skin: Skin is warm. Capillary refill takes less than 2 seconds.  Psychiatric: He has a normal mood and affect. His behavior is normal. Judgment and thought content normal.  Nursing note and vitals reviewed.  Assessment:   1. Chronic bilateral low back pain, with sciatica presence unspecified 2. Essential hypertension 3. Type 2 diabetes mellitus with diabetic polyneuropathy, unspecified whether long term insulin use (HCC) 4. Mixed hyperlipidemia 5. Follow up  Plan:   1. Chronic bilateral low back pain, with sciatica presence unspecified Worsening. We will add Neurontin to help relieve lower back pain. He will continue Naproxen as directed. We will refill medications today. We will re-assess in 1 month.  - gabapentin (NEURONTIN) 300 MG capsule; Take 1 capsule (300 mg total) by mouth 3  (three) times daily.  Dispense: 90 capsule; Refill: 1  2. Essential hypertension Blood pressure is 144/66 today. Continue Amlodipine, HCTZ, and Cozaar, and Metoprolol. Monitor.   3. Type 2 diabetes mellitus with diabetic polyneuropathy, unspecified whether long term insulin use (HCC) Hgb A1c is improving. Hgb A1c was 8.9 on 11/12/2017, decreased from 9.3 on 07/22/2017. He will continue Insulin and Metformin.   4. Mixed hyperlipidemia Triglycerides are elevated at 243. We will continue to monitor.   5. Visual changes We will refer him to Optometry today.   6. Follow up He will follow up in 1 month to continue monitoring chronic diseases and assess pain.  We will re-assess CBC, TSH, Lipid, and CMET.  Meds ordered this encounter  Medications  . amLODipine (NORVASC) 10 MG tablet    Sig: Take 1 tablet (10 mg total) by mouth daily.    Dispense:  30 tablet    Refill:  3  . atorvastatin (LIPITOR) 10 MG tablet    Sig: Take 1 tablet (10 mg total) by mouth daily.    Dispense:  30 tablet    Refill:  3  . hydrochlorothiazide (HYDRODIURIL) 25 MG tablet    Sig: Take 1 tablet (25 mg total) by mouth 2 (two) times daily.    Dispense:  60 tablet    Refill:  3  . insulin detemir (LEVEMIR) 100 unit/ml SOLN    Sig: Inject 0.65 mLs (65 Units total) into the skin at bedtime.    Dispense:  10 mL    Refill:  11  . insulin glulisine (APIDRA) 100 UNIT/ML injection    Sig: Inject 0.08 mLs (8 Units total) into the skin 2 (two) times daily after a meal. Before dinner    Dispense:  10 mL    Refill:  11  . losartan (COZAAR) 100 MG tablet    Sig: Take 1 tablet (100 mg total) by mouth daily.    Dispense:  30 tablet    Refill:  3  . metFORMIN (GLUCOPHAGE) 1000 MG tablet    Sig: Take 1 tablet (1,000 mg total) by mouth 2 (two) times daily with a meal.  Dispense:  60 tablet    Refill:  3  . metoprolol tartrate (LOPRESSOR) 50 MG tablet    Sig: Take 1 tablet (50 mg total) by mouth 2 (two) times daily.     Dispense:  60 tablet    Refill:  3  . naproxen (NAPROSYN) 500 MG tablet    Sig: Take 1 tablet (500 mg total) by mouth 2 (two) times daily with a meal.    Dispense:  60 tablet    Refill:  3  . gabapentin (NEURONTIN) 300 MG capsule    Sig: Take 1 capsule (300 mg total) by mouth 3 (three) times daily.    Dispense:  90 capsule    Refill:  1   Raliegh Ip,  MSN, FNP-BC Patient [facility] [address] [place], [place] [address] [phone]

## 2017-11-20 MED ORDER — INSULIN DETEMIR 100 UNIT/ML FLEXPEN: 65.0000 [IU] | Freq: Every day | SUBCUTANEOUS | 11 refills | Status: DC

## 2017-11-20 MED ORDER — GABAPENTIN 300 MG PO CAPS: 300.0000 mg | ORAL_CAPSULE | Freq: Three times a day (TID) | ORAL | 1 refills | Status: DC

## 2017-11-20 MED ORDER — ATORVASTATIN CALCIUM 10 MG PO TABS: 10.0000 mg | ORAL_TABLET | Freq: Every day | ORAL | 3 refills | Status: DC

## 2017-11-20 MED ORDER — INSULIN GLULISINE 100 UNIT/ML IJ SOLN: 8.0000 [IU] | Freq: Two times a day (BID) | INTRAMUSCULAR | 11 refills | Status: DC

## 2017-11-20 MED ORDER — NAPROXEN 500 MG PO TABS: 500.0000 mg | ORAL_TABLET | Freq: Two times a day (BID) | ORAL | 3 refills | Status: DC

## 2017-11-20 MED ORDER — HYDROCHLOROTHIAZIDE 25 MG PO TABS: 25.0000 mg | ORAL_TABLET | Freq: Two times a day (BID) | ORAL | 3 refills | Status: DC

## 2017-11-20 MED ORDER — METFORMIN HCL 1000 MG PO TABS: 1000.0000 mg | ORAL_TABLET | Freq: Two times a day (BID) | ORAL | 3 refills | Status: DC

## 2017-11-20 MED ORDER — METOPROLOL TARTRATE 50 MG PO TABS: 50.0000 mg | ORAL_TABLET | Freq: Two times a day (BID) | ORAL | 3 refills | Status: DC

## 2017-11-20 MED ORDER — LOSARTAN POTASSIUM 100 MG PO TABS: 100.0000 mg | ORAL_TABLET | Freq: Every day | ORAL | 3 refills | Status: DC

## 2017-11-20 MED ORDER — AMLODIPINE BESYLATE 10 MG PO TABS: 10.0000 mg | ORAL_TABLET | Freq: Every day | ORAL | 3 refills | Status: DC

## 2017-11-20 NOTE — Patient Instructions (Signed)
Gabapentin capsules or tablets What is this medicine? GABAPENTIN (GA ba pen tin) is used to control partial seizures in adults with epilepsy. It is also used to treat certain types of nerve pain. This medicine may be used for other purposes; ask your health care provider or pharmacist if you have questions. COMMON BRAND NAME(S): Active-PAC with Gabapentin, Gabarone, Neurontin What should I tell my health care provider before I take this medicine? They need to know if you have any of these conditions: -kidney disease -suicidal thoughts, plans, or attempt; a previous suicide attempt by you or a family member -an unusual or allergic reaction to gabapentin, other medicines, foods, dyes, or preservatives -pregnant or trying to get pregnant -breast-feeding How should I use this medicine? Take this medicine by mouth with a glass of water. Follow the directions on the prescription label. You can take it with or without food. If it upsets your stomach, take it with food.Take your medicine at regular intervals. Do not take it more often than directed. Do not stop taking except on your doctor's advice. If you are directed to break the 600 or 800 mg tablets in half as part of your dose, the extra half tablet should be used for the next dose. If you have not used the extra half tablet within 28 days, it should be thrown away. A special MedGuide will be given to you by the pharmacist with each prescription and refill. Be sure to read this information carefully each time. Talk to your pediatrician regarding the use of this medicine in children. Special care may be needed. Overdosage: If you think you have taken too much of this medicine contact a poison control center or emergency room at once. NOTE: This medicine is only for you. Do not share this medicine with others. What if I miss a dose? If you miss a dose, take it as soon as you can. If it is almost time for your next dose, take only that dose. Do not  take double or extra doses. What may interact with this medicine? Do not take this medicine with any of the following medications: -other gabapentin products This medicine may also interact with the following medications: -alcohol -antacids -antihistamines for allergy, cough and cold -certain medicines for anxiety or sleep -certain medicines for depression or psychotic disturbances -homatropine; hydrocodone -naproxen -narcotic medicines (opiates) for pain -phenothiazines like chlorpromazine, mesoridazine, prochlorperazine, thioridazine This list may not describe all possible interactions. Give your health care provider a list of all the medicines, herbs, non-prescription drugs, or dietary supplements you use. Also tell them if you smoke, drink alcohol, or use illegal drugs. Some items may interact with your medicine. What should I watch for while using this medicine? Visit your doctor or health care professional for regular checks on your progress. You may want to keep a record at home of how you feel your condition is responding to treatment. You may want to share this information with your doctor or health care professional at each visit. You should contact your doctor or health care professional if your seizures get worse or if you have any new types of seizures. Do not stop taking this medicine or any of your seizure medicines unless instructed by your doctor or health care professional. Stopping your medicine suddenly can increase your seizures or their severity. Wear a medical identification bracelet or chain if you are taking this medicine for seizures, and carry a card that lists all your medications. You may get drowsy, dizzy,   or have blurred vision. Do not drive, use machinery, or do anything that needs mental alertness until you know how this medicine affects you. To reduce dizzy or fainting spells, do not sit or stand up quickly, especially if you are an older patient. Alcohol can  increase drowsiness and dizziness. Avoid alcoholic drinks. Your mouth may get dry. Chewing sugarless gum or sucking hard candy, and drinking plenty of water will help. The use of this medicine may increase the chance of suicidal thoughts or actions. Pay special attention to how you are responding while on this medicine. Any worsening of mood, or thoughts of suicide or dying should be reported to your health care professional right away. Women who become pregnant while using this medicine may enroll in the [place] Antiepileptic Drug Pregnancy Registry by calling 1-888-233-2334. This registry collects information about the safety of antiepileptic drug use during pregnancy. What side effects may I notice from receiving this medicine? Side effects that you should report to your doctor or health care professional as soon as possible: -allergic reactions like skin rash, itching or hives, swelling of the face, lips, or tongue -worsening of mood, thoughts or actions of suicide or dying Side effects that usually do not require medical attention (report to your doctor or health care professional if they continue or are bothersome): -constipation -difficulty walking or controlling muscle movements -dizziness -nausea -slurred speech -tiredness -tremors -weight gain This list may not describe all possible side effects. Call your doctor for medical advice about side effects. You may report side effects to FDA at 1-800-FDA-1088. Where should I keep my medicine? Keep out of reach of children. This medicine may cause accidental overdose and death if it taken by other adults, children, or pets. Mix any unused medicine with a substance like cat litter or coffee grounds. Then throw the medicine away in a sealed container like a sealed bag or a coffee can with a lid. Do not use the medicine after the expiration date. Store at room temperature between 15 and 30 degrees C (59 and 86 degrees F). NOTE: This  sheet is a summary. It may not cover all possible information. If you have questions about this medicine, talk to your doctor, pharmacist, or health care provider.  2018 Elsevier/Gold Standard (2013-08-05 15:26:50)  

## 2017-11-24 ENCOUNTER — Telehealth: Payer: Self-pay | Admitting: Licensed Clinical Social Worker

## 2017-11-24 NOTE — Telephone Encounter (Signed)
Clinician reached out to the patient regarding answers to his social determinants screening at this last doctor's appointment at [facility]. She asked the client what resources he is in need of and explained that there is an online case management program called [place] [employer] that she can place his information into to help him with resources that he needs.   Mr. Lance Vaughan was agreeable to the plans and asked that a consent be sent to his email.

## 2017-11-25 ENCOUNTER — Telehealth: Payer: Self-pay | Admitting: Pharmacist

## 2017-11-25 NOTE — Telephone Encounter (Signed)
11/25/2017 11:04:14 AM - Refills-Lantus & Apidra Pens  11/25/17 [facility] [employer] [facility] to sign on Lantus Solostar Inject 65 units under the skin every morning #4 & Apidra Solostar Inject 8 units twice daily with meals #1.Forde RadonAJ

## 2017-12-03 ENCOUNTER — Ambulatory Visit: Payer: Medicaid Other | Admitting: Ophthalmology

## 2017-12-03 LAB — HM DIABETES EYE EXAM

## 2017-12-04 ENCOUNTER — Telehealth: Payer: Self-pay | Admitting: Pharmacist

## 2017-12-04 NOTE — Telephone Encounter (Signed)
12/04/2017 8:39:10 AM - Lantus Solostar & Apidra Solostar refills  12/04/17 Faxed [employer] refill request for Lantus Solostar Inject 65 units under the skin every morning #4 & Apidra Solostar Inject 8 units twice daily with meals #1.Forde RadonAJ

## 2017-12-09 ENCOUNTER — Other Ambulatory Visit: Payer: Self-pay | Admitting: Ophthalmology

## 2017-12-31 ENCOUNTER — Ambulatory Visit: Payer: Medicaid Other | Admitting: Family Medicine

## 2017-12-31 VITALS — BP 128/88 | HR 113 | Temp 98.1°F | Ht 71.0 in | Wt 271.4 lb

## 2017-12-31 DIAGNOSIS — G8929 Other chronic pain: Secondary | ICD-10-CM

## 2017-12-31 DIAGNOSIS — Z0000 Encounter for general adult medical examination without abnormal findings: Secondary | ICD-10-CM

## 2017-12-31 DIAGNOSIS — Z716 Tobacco abuse counseling: Secondary | ICD-10-CM

## 2017-12-31 DIAGNOSIS — M545 Low back pain: Principal | ICD-10-CM

## 2017-12-31 MED ORDER — NAPROXEN 500 MG PO TABS: 500.0000 mg | ORAL_TABLET | Freq: Two times a day (BID) | ORAL | 3 refills | Status: DC

## 2017-12-31 MED ORDER — GABAPENTIN 100 MG PO CAPS: 100.0000 mg | ORAL_CAPSULE | Freq: Three times a day (TID) | ORAL | 2 refills | Status: DC

## 2017-12-31 NOTE — Progress Notes (Signed)
Subjective:     Patient ID: Lance Vaughan, male   DOB: Feb 19, 1966, 52 y.o.   MRN: [id number]   PCP: Raliegh Ip, NP  Chief Complaint  Patient presents with  . Sciatica  . Diabetes     Diabetes  He presents for his follow-up diabetic visit. Hypoglycemia symptoms include headaches.    Lance Vaughan has a history of Psychosis, Hypertension, Hyperlipidemia, Diabetes, Depression, and Bipolar Disorder.  He is here today for follow up to evaluate effects of meds.   Current Status: Since his last office visit, he states that his back pain is stable and has headaches daily. He states Naproxen gives him an upset stomach. He states he is not taking pain reliever for headaches because he thought he had run out of Naproxen. For his vision changes he saw the optometrist and received glasses. He denies fevers, chills, fatigue, weight loss, and night sweats. He denies cough, chest pain, heart palpitations, and shortness of breath.   He has a good appetite. He denies abdominal pain, nausea, vomiting, diarrhea, and constipation.  His anxiety is stable and has been started on Zoloft 25mg   He has chronic back pain. Pain is stable from last visit.   He expressed that he is interested in smoking cessation.   Past Medical History:  Diagnosis Date  . Bipolar disorder (HCC)   . Depression   . Diabetes mellitus without complication (HCC)   . Diverticulitis   . Hyperlipidemia   . Hypertension   . Psychosis (HCC)   . Psychosis (HCC)     Family History  Problem Relation Age of Onset  . Diabetes Mother   . Hypertension Mother   . Cancer Mother   . Depression Mother   . Hypertension Sister   . Hyperlipidemia Sister   . Hyperlipidemia Sister   . Hypertension Sister     Social History   Socioeconomic History  . Marital status: Single    Spouse name: Not on file  . Number of children: 4  . Years of education: Not on file  . Highest education level: GED or equivalent  Occupational History  .  Occupation: disability  Social Needs  . Financial resource strain: Very hard  . Food insecurity:    Worry: Often true    Inability: Sometimes true  . Transportation needs:    Medical: Yes    Non-medical: No  Tobacco Use  . Smoking status: Current Every Day Smoker    Packs/day: 0.50    Years: 30.00    Pack years: 15.00  . Smokeless tobacco: Never Used  Substance and Sexual Activity  . Alcohol use: No    Frequency: Never  . Drug use: No  . Sexual activity: Never  Lifestyle  . Physical activity:    Days per week: 3 days    Minutes per session: 10 min  . Stress: Rather much  Relationships  . Social connections:    Talks on phone: Once a week    Gets together: Never    Attends religious service: Never    Active member of club or organization: No    Attends meetings of clubs or organizations: Never    Relationship status: Separated  . Intimate partner violence:    Fear of current or ex partner: No    Emotionally abused: No    Physically abused: No    Forced sexual activity: No  Other Topics Concern  . Not on file  Social History Narrative   Patient is currently living  with his ex-wife, but is looking for other housing options.  He gets food when he has a ride to the foodbank.     No past surgical history on file.    There is no immunization history on file for this patient.   Current Meds  Medication Sig  . amLODipine (NORVASC) 10 MG tablet Take 1 tablet (10 mg total) by mouth daily.  Marland Kitchen aspirin EC 81 MG tablet Take 81 mg by mouth daily.  Marland Kitchen atorvastatin (LIPITOR) 10 MG tablet Take 1 tablet (10 mg total) by mouth daily.  Marland Kitchen glucose blood test strip Use as instructed  . haloperidol (HALDOL) 10 MG tablet Take 10 mg by mouth 1 day or 1 dose.  . hydrochlorothiazide (HYDRODIURIL) 25 MG tablet Take 1 tablet (25 mg total) by mouth 2 (two) times daily.  . insulin detemir (LEVEMIR) 100 unit/ml SOLN Inject 0.65 mLs (65 Units total) into the skin at bedtime.  . insulin  glulisine (APIDRA) 100 UNIT/ML injection Inject 0.08 mLs (8 Units total) into the skin 2 (two) times daily after a meal. Before dinner  . lithium carbonate 150 MG capsule Take 10 mg by mouth 1 day or 1 dose.  . losartan (COZAAR) 100 MG tablet Take 1 tablet (100 mg total) by mouth daily.  . metFORMIN (GLUCOPHAGE) 1000 MG tablet Take 1 tablet (1,000 mg total) by mouth 2 (two) times daily with a meal.  . metoprolol tartrate (LOPRESSOR) 50 MG tablet Take 1 tablet (50 mg total) by mouth 2 (two) times daily.  . naproxen (NAPROSYN) 500 MG tablet Take 1 tablet (500 mg total) by mouth 2 (two) times daily with a meal.  . sertraline (ZOLOFT) 25 MG tablet Take 10 mg by mouth daily.  . [DISCONTINUED] gabapentin (NEURONTIN) 300 MG capsule Take 1 capsule (300 mg total) by mouth 3 (three) times daily.  . [DISCONTINUED] naproxen (NAPROSYN) 500 MG tablet Take 1 tablet (500 mg total) by mouth 2 (two) times daily with a meal.   Current Facility-Administered Medications for the 12/31/17 encounter (Office Visit) with Kallie Locks, FNP  Medication  . insulin aspart protamine- aspart (NOVOLOG MIX 70/30) injection 8 Units  . insulin glargine (LANTUS) injection 65 Units    Allergies  Allergen Reactions  . Lisinopril Other (See Comments)    Chest pain    BP 128/88 (BP Location: Left Arm, Patient Position: Sitting, Cuff Size: Large)   Pulse (!) 113   Temp 98.1 F (36.7 C)   Ht 5\' 11"  (1.803 m)   Wt 271 lb 6.4 oz (123.1 kg)   BMI 37.85 kg/m     Review of Systems  Eyes: Negative.   Respiratory: Negative.   Cardiovascular: Negative.   Gastrointestinal: Negative.   Endocrine: Negative.   Genitourinary: Negative.   Musculoskeletal: Negative.   Skin: Negative.   Allergic/Immunologic: Negative.   Neurological: Positive for headaches.  Hematological: Negative.   Psychiatric/Behavioral: Negative.    Objective:   Physical Exam  Constitutional: He is oriented to person, place, and time. He appears  well-developed and well-nourished.  HENT:  Head: Normocephalic and atraumatic.  Right Ear: External ear normal.  Left Ear: External ear normal.  Nose: Nose normal.  Mouth/Throat: Oropharynx is clear and moist.  Eyes: Pupils are equal, round, and reactive to light. Conjunctivae and EOM are normal.  Neck: Normal range of motion. Neck supple.  Cardiovascular: Normal rate, regular rhythm, normal heart sounds and intact distal pulses.  Pulmonary/Chest: Effort normal and breath sounds normal.  Abdominal:  Soft. Bowel sounds are normal.  Musculoskeletal:  Chronic lower back pain.  Neurological: He is alert and oriented to person, place, and time.  Skin: Skin is warm. Capillary refill takes less than 2 seconds.  Psychiatric: He has a normal mood and affect. His behavior is normal. Judgment and thought content normal.  Nursing note and vitals reviewed.  Assessment:   1. Chronic bilateral low back pain, with sciatica presence unspecified 2. Essential hypertension 3. Type 2 diabetes mellitus with diabetic polyneuropathy, unspecified whether long term insulin use (HCC) 4. Mixed hyperlipidemia 5. Follow up  Plan:   1. Chronic bilateral low back pain, with sciatica presence unspecified Stable. Decrease Neurontin to 100mg  due to upset stomach. He will continue Naproxen as directed. We will refill Naproxen today and change the dosage of Neurontin. We will re-assess in 1 month.  - gabapentin (NEURONTIN) 100 MG capsule; Take 1 capsule (100 mg total) by mouth 3 (three) times daily.  Dispense: 90 capsule; Refill: 1  2. Essential hypertension Blood pressure is 128/88 today. Continue Amlodipine, HCTZ, and Cozaar, and Metoprolol. Monitor.   3. Type 2 diabetes mellitus with diabetic polyneuropathy, unspecified whether long term insulin use (HCC) Last Hgb A1c was 8.9 on 11/12/2017, decreased from 9.3 on 07/22/2017. He will continue Insulin and Metformin.   4. Mixed hyperlipidemia Triglycerides were  elevated at 243 on 07/22/17. We will continue to monitor.   5. Visual changes He received glasses which has improved vision but has a daily headache since wearing them.   6. Smoking Cessation We will initiate Chanitx.   7. Follow up Labs tonight for A1c, CBC, CMET, TSH, and Lipid. He will follow up in 1 month for lab results and to continue monitoring chronic diseases and assess pain with changes in Neurontin.  Meds ordered this encounter  Medications  . naproxen (NAPROSYN) 500 MG tablet    Sig: Take 1 tablet (500 mg total) by mouth 2 (two) times daily with a meal.    Dispense:  60 tablet    Refill:  3  . gabapentin (NEURONTIN) 100 MG capsule    Sig: Take 1 capsule (100 mg total) by mouth 3 (three) times daily.    Dispense:  100 capsule    Refill:  2   Raliegh IpNatalie Stroud,  MSN, FNP-C [facility] [place] [address] [place] , [place] [address] [phone]

## 2018-01-01 LAB — LIPID PANEL
Chol/HDL Ratio: 5.8 ratio — ABNORMAL HIGH (ref 0.0–5.0)
Cholesterol, Total: 193 mg/dL (ref 100–199)
HDL: 33 mg/dL — ABNORMAL LOW (ref >39)
Triglycerides: 566 mg/dL (ref 0–149)

## 2018-01-01 LAB — COMPREHENSIVE METABOLIC PANEL
ALT: 37 IU/L (ref 0–44)
AST: 22 IU/L (ref 0–40)
Albumin/Globulin Ratio: 2 (ref 1.2–2.2)
Albumin: 4.1 g/dL (ref 3.5–5.5)
Alkaline Phosphatase: 60 IU/L (ref 39–117)
BUN/Creatinine Ratio: 17 (ref 9–20)
BUN: 19 mg/dL (ref 6–24)
Bilirubin Total: <0.2 mg/dL (ref 0.0–1.2)
CO2: 22 mmol/L (ref 20–29)
Calcium: 9 mg/dL (ref 8.7–10.2)
Chloride: 103 mmol/L (ref 96–106)
Creatinine, Ser: 1.09 mg/dL (ref 0.76–1.27)
GFR calc Af Amer: 90 mL/min/{1.73_m2} (ref >59)
GFR calc non Af Amer: 78 mL/min/{1.73_m2} (ref >59)
Globulin, Total: 2.1 g/dL (ref 1.5–4.5)
Glucose: 110 mg/dL — ABNORMAL HIGH (ref 65–99)
Potassium: 4.1 mmol/L (ref 3.5–5.2)
Sodium: 140 mmol/L (ref 134–144)
Total Protein: 6.2 g/dL (ref 6.0–8.5)

## 2018-01-01 LAB — CBC WITH DIFFERENTIAL/PLATELET
Basophils Absolute: 0 10*3/uL (ref 0.0–0.2)
Basos: 1 %
EOS (ABSOLUTE): 0.4 10*3/uL (ref 0.0–0.4)
Eos: 6 %
Hematocrit: 43.8 % (ref 37.5–51.0)
Hemoglobin: 15.4 g/dL (ref 13.0–17.7)
Immature Grans (Abs): 0 10*3/uL (ref 0.0–0.1)
Immature Granulocytes: 0 %
Lymphocytes Absolute: 2.5 10*3/uL (ref 0.7–3.1)
Lymphs: 41 %
MCH: 31.8 pg (ref 26.6–33.0)
MCHC: 35.2 g/dL (ref 31.5–35.7)
MCV: 90 fL (ref 79–97)
Monocytes Absolute: 0.4 10*3/uL (ref 0.1–0.9)
Monocytes: 7 %
Neutrophils Absolute: 2.7 10*3/uL (ref 1.4–7.0)
Neutrophils: 45 %
Platelets: 287 10*3/uL (ref 150–450)
RBC: 4.85 x10E6/uL (ref 4.14–5.80)
RDW: 13.8 % (ref 12.3–15.4)
WBC: 6 10*3/uL (ref 3.4–10.8)

## 2018-01-01 LAB — HEMOGLOBIN A1C
Est. average glucose Bld gHb Est-mCnc: 217 mg/dL
Hgb A1c MFr Bld: 9.2 % — ABNORMAL HIGH (ref 4.8–5.6)

## 2018-01-05 ENCOUNTER — Telehealth: Payer: Self-pay

## 2018-01-05 NOTE — Telephone Encounter (Signed)
Gave pt lab results and scheduled f/u for 2 weeks.

## 2018-01-05 NOTE — Telephone Encounter (Signed)
-----   Message from Kallie LocksNatalie M Stroud, FNP sent at 01/04/2018  7:37 PM EDT ----- Lance CorriganLorrie,  Elevated Triglycerides. Note sent to Lance Vaughan to schedule patient for follow up in 2 weeks for assessment of possible cause of elevated Triglycerides.

## 2018-01-09 MED ORDER — VARENICLINE TARTRATE 1 MG PO TABS: 1.0000 mg | ORAL_TABLET | Freq: Two times a day (BID) | ORAL | 1 refills | Status: DC

## 2018-01-13 ENCOUNTER — Telehealth: Payer: Self-pay | Admitting: Pharmacist

## 2018-01-13 NOTE — Telephone Encounter (Signed)
01/13/2018 2:04:41 PM - Call to [employer] on status on Apidra Solostar  01/13/18 received an invoice where we received Lantus Solostar and patient picked up today. I called [employer] on status of Apidra Solostar that was also faxed to them 12/04/17 both at the same time. [facility], this was received but not processed, she processed while on the phone, and said to allow 3-5 business days to receive.Forde RadonAJ

## 2018-01-13 NOTE — Telephone Encounter (Signed)
01/13/2018 4:11:59 PM - Chantix New Med  01/13/18 I have received a pharmacy printout for new med-Chantix 1mg  Take 1 tablet by mouth two times a day-also note to order starter pack-discussed with Rita/Christan (we have a starter pack in pharmacy if provider will sign the [facility]) Also [facility] Chantix 1mg  Take 1 tablet two times a day, and mailing patient his portion of Pfizer application to sign & return.Forde RadonAJ

## 2018-01-21 ENCOUNTER — Ambulatory Visit: Payer: Medicaid Other

## 2018-02-04 ENCOUNTER — Ambulatory Visit: Payer: Medicaid Other

## 2018-02-23 ENCOUNTER — Telehealth: Payer: Self-pay | Admitting: Pharmacist

## 2018-02-23 NOTE — Telephone Encounter (Signed)
02/23/2018 8:53:26 AM - Lantus Solostar pen refill  02/23/18 Taking [employer] refill request to [facility] for Lantus Solostar pen Inject 65 units under the skin every morning #4.Forde Radon

## 2018-02-25 ENCOUNTER — Encounter: Payer: Self-pay | Admitting: Adult Health

## 2018-02-25 ENCOUNTER — Ambulatory Visit: Payer: Medicaid Other | Admitting: Adult Health

## 2018-02-25 VITALS — BP 147/99 | HR 72 | Ht 71.0 in | Wt 274.3 lb

## 2018-02-25 DIAGNOSIS — E782 Mixed hyperlipidemia: Secondary | ICD-10-CM

## 2018-02-25 DIAGNOSIS — F331 Major depressive disorder, recurrent, moderate: Secondary | ICD-10-CM

## 2018-02-25 DIAGNOSIS — Z794 Long term (current) use of insulin: Secondary | ICD-10-CM

## 2018-02-25 DIAGNOSIS — F339 Major depressive disorder, recurrent, unspecified: Secondary | ICD-10-CM | POA: Insufficient documentation

## 2018-02-25 DIAGNOSIS — N529 Male erectile dysfunction, unspecified: Secondary | ICD-10-CM | POA: Insufficient documentation

## 2018-02-25 DIAGNOSIS — F319 Bipolar disorder, unspecified: Secondary | ICD-10-CM | POA: Insufficient documentation

## 2018-02-25 DIAGNOSIS — K921 Melena: Secondary | ICD-10-CM | POA: Insufficient documentation

## 2018-02-25 DIAGNOSIS — F17200 Nicotine dependence, unspecified, uncomplicated: Secondary | ICD-10-CM | POA: Insufficient documentation

## 2018-02-25 DIAGNOSIS — E1140 Type 2 diabetes mellitus with diabetic neuropathy, unspecified: Secondary | ICD-10-CM

## 2018-02-25 DIAGNOSIS — I10 Essential (primary) hypertension: Secondary | ICD-10-CM

## 2018-02-25 DIAGNOSIS — F17210 Nicotine dependence, cigarettes, uncomplicated: Secondary | ICD-10-CM | POA: Insufficient documentation

## 2018-02-25 DIAGNOSIS — K5900 Constipation, unspecified: Secondary | ICD-10-CM

## 2018-02-25 DIAGNOSIS — F3132 Bipolar disorder, current episode depressed, moderate: Secondary | ICD-10-CM

## 2018-02-25 MED ORDER — SERTRALINE HCL 100 MG PO TABS: 100.0000 mg | ORAL_TABLET | Freq: Every day | ORAL | 0 refills | Status: DC

## 2018-02-25 MED ORDER — ASPIRIN EC 81 MG PO TBEC: 81.0000 mg | DELAYED_RELEASE_TABLET | Freq: Every day | ORAL | 0 refills | Status: DC

## 2018-02-25 MED ORDER — ACETAMINOPHEN 500 MG PO TABS: 1000.0000 mg | ORAL_TABLET | Freq: Three times a day (TID) | ORAL | 0 refills | Status: DC | PRN

## 2018-02-25 MED ORDER — GLUCOSE BLOOD VI STRP: ORAL_STRIP | 12 refills | Status: DC

## 2018-02-25 MED ORDER — METFORMIN HCL 1000 MG PO TABS: 1000.0000 mg | ORAL_TABLET | Freq: Two times a day (BID) | ORAL | 3 refills | Status: DC

## 2018-02-25 MED ORDER — INSULIN DETEMIR 100 UNIT/ML FLEXPEN: 65.0000 [IU] | Freq: Every day | SUBCUTANEOUS | 11 refills | Status: DC

## 2018-02-25 MED ORDER — POLYETHYLENE GLYCOL 3350 17 G PO PACK: 17.0000 g | PACK | Freq: Every day | ORAL | 0 refills | Status: DC

## 2018-02-25 MED ORDER — ATORVASTATIN CALCIUM 10 MG PO TABS: 10.0000 mg | ORAL_TABLET | Freq: Every day | ORAL | 3 refills | Status: DC

## 2018-02-25 MED ORDER — TADALAFIL 20 MG PO TABS: 20.0000 mg | ORAL_TABLET | Freq: Every day | ORAL | 0 refills | Status: DC | PRN

## 2018-02-25 MED ORDER — AMLODIPINE BESYLATE 10 MG PO TABS: 10.0000 mg | ORAL_TABLET | Freq: Every day | ORAL | 3 refills | Status: DC

## 2018-02-25 MED ORDER — GABAPENTIN 100 MG PO CAPS: 100.0000 mg | ORAL_CAPSULE | Freq: Three times a day (TID) | ORAL | 2 refills | Status: DC

## 2018-02-25 MED ORDER — METOPROLOL TARTRATE 50 MG PO TABS: 50.0000 mg | ORAL_TABLET | Freq: Two times a day (BID) | ORAL | 3 refills | Status: DC

## 2018-02-25 MED ORDER — INSULIN GLULISINE 100 UNIT/ML IJ SOLN: 8.0000 [IU] | Freq: Two times a day (BID) | INTRAMUSCULAR | 11 refills | Status: DC

## 2018-02-25 MED ORDER — HYDROCHLOROTHIAZIDE 25 MG PO TABS: 25.0000 mg | ORAL_TABLET | Freq: Two times a day (BID) | ORAL | 3 refills | Status: DC

## 2018-02-25 MED ORDER — LOSARTAN POTASSIUM 100 MG PO TABS: 100.0000 mg | ORAL_TABLET | Freq: Every day | ORAL | 3 refills | Status: DC

## 2018-02-25 MED ORDER — SERTRALINE HCL 25 MG PO TABS: 25.0000 mg | ORAL_TABLET | Freq: Every day | ORAL | 0 refills | Status: DC

## 2018-02-25 NOTE — Progress Notes (Signed)
Patient: Lance Vaughan Male    DOB: [birth year]   52 y.o.   MRN: [id number] Visit Date: 02/25/2018  Today's Provider: Shawn Route, NP   Chief Complaint  Patient presents with  . Hyperlipidemia   Subjective:    HPI This is a 52 year old African-American male who presents for follow-up of hypertension, type 2 diabetes, diabetic neuropathy, erectile dysfunction, and depression.  He is complaining of bilateral shoulder, neck and back pain that is chronic. He describes pain as sharp, starts from his shoulders and radiates down his arms and upper back, and worse in the morning. Reports minimal relief with naproxen. No recent x-rays. He is c/o numbness, burning and tingling in his feet. He is on neurontin and reports no relief with current dose. He is on insulin and metformin. His last HgA1c is up from 8.9 to 9.2. He reports skipping insulin on days that his blood glucose is in the 130s. He did not bring his glycemic log but states that his levels are usually <200 but did have readings as high as 400mg /dl. He sees a Therapist, sports at [employer]. He was last seen 02/10/2018. He states that he was taken off lithium and celexa and now on zoloft 100mg  daily. He denies mood swings and SI.  His blood pressure is moderately controlled. He denies chest pain, palpitations, nausea and vomiting. Also c/o bloody stools and constipation. He reports taking 3 tablets of dulcolax without relief. His last colonoscoy was years ago with some polyps removal. Mild pain and straining with BMs.   Allergies  Allergen Reactions  . Lisinopril Other (See Comments)    Chest pain   Previous Medications   AMLODIPINE (NORVASC) 10 MG TABLET    Take 1 tablet (10 mg total) by mouth daily.   AMOXICILLIN (AMOXIL) 500 MG CAPSULE    Take 1 capsule (500 mg total) by mouth 3 (three) times daily.   ASPIRIN EC 81 MG TABLET    Take 81 mg by mouth daily.   ATORVASTATIN (LIPITOR) 10 MG TABLET    Take 1 tablet (10 mg total) by mouth daily.    CITALOPRAM (CELEXA) 10 MG TABLET    Take 10 mg by mouth daily.   GABAPENTIN (NEURONTIN) 100 MG CAPSULE    Take 1 capsule (100 mg total) by mouth 3 (three) times daily.   GLUCOSE BLOOD TEST STRIP    Use as instructed   HALOPERIDOL (HALDOL) 10 MG TABLET    Take 10 mg by mouth 1 day or 1 dose.   HYDROCHLOROTHIAZIDE (HYDRODIURIL) 25 MG TABLET    Take 1 tablet (25 mg total) by mouth 2 (two) times daily.   INSULIN DETEMIR (LEVEMIR) 100 UNIT/ML SOLN    Inject 0.65 mLs (65 Units total) into the skin at bedtime.   INSULIN GLULISINE (APIDRA) 100 UNIT/ML INJECTION    Inject 0.08 mLs (8 Units total) into the skin 2 (two) times daily after a meal. Before dinner   LITHIUM CARBONATE 150 MG CAPSULE    Take 10 mg by mouth 1 day or 1 dose.   LOSARTAN (COZAAR) 100 MG TABLET    Take 1 tablet (100 mg total) by mouth daily.   METFORMIN (GLUCOPHAGE) 1000 MG TABLET    Take 1 tablet (1,000 mg total) by mouth 2 (two) times daily with a meal.   METOPROLOL TARTRATE (LOPRESSOR) 50 MG TABLET    Take 1 tablet (50 mg total) by mouth 2 (two) times daily.   NAPROXEN (NAPROSYN) 500 MG TABLET  Take 1 tablet (500 mg total) by mouth 2 (two) times daily with a meal.   SERTRALINE (ZOLOFT) 25 MG TABLET    Take 10 mg by mouth daily.   VARENICLINE (CHANTIX) 1 MG TABLET    Take 1 tablet (1 mg total) by mouth 2 (two) times daily.    Review of Systems  Constitutional: Negative.   HENT: Negative.   Eyes: Negative.   Respiratory: Negative.   Cardiovascular: Negative.   Gastrointestinal: Negative.   Endocrine: Negative.   Genitourinary: Negative.   Musculoskeletal: Positive for back pain and neck pain.  Skin: Negative.   Neurological: Positive for numbness.  Hematological: Negative.   Psychiatric/Behavioral: Negative.     Social History   Tobacco Use  . Smoking status: Current Every Day Smoker    Packs/day: 0.50    Years: 30.00    Pack years: 15.00  . Smokeless tobacco: Never Used  Substance Use Topics  . Alcohol use: No     Frequency: Never   Objective:   BP (!) 147/99 (BP Location: Right Arm, Patient Position: Sitting, Cuff Size: Large)   Pulse 72   Ht 5\' 11"  (1.803 m)   Wt 274 lb 4.8 oz (124.4 kg)   BMI 38.26 kg/m   Physical Exam  Constitutional: He is oriented to person, place, and time. He appears well-developed and well-nourished.  HENT:  Head: Normocephalic and atraumatic.  Mouth/Throat: Oropharynx is clear and moist.  Eyes: Pupils are equal, round, and reactive to light. Conjunctivae and EOM are normal.  Neck: Normal range of motion. Neck supple.  Cardiovascular: Normal rate, regular rhythm, normal heart sounds and intact distal pulses.  Pulmonary/Chest: Effort normal and breath sounds normal.  Abdominal: Soft. Bowel sounds are normal.  Genitourinary:  Genitourinary Comments: deferred  Musculoskeletal: Normal range of motion. He exhibits tenderness (pian with flexion and extension of the c-spine and shoulders).  Neurological: He is alert and oriented to person, place, and time. He displays normal reflexes. No sensory deficit. Coordination normal.  Skin: Skin is warm and dry. Capillary refill takes less than 2 seconds.  Psychiatric: He has a normal mood and affect.  Nursing note and vitals reviewed.   Assessment & Plan:   1. Essential hypertension Continue losartan and HCTZ. Daily exercise and salt limitation  2. Mixed hyperlipidemia Last lipid panel abnormal with elevated Triglycerides and low HDL. Continue atorvastatin 10mg  daily, weight loss interventions and daily exercise.   3. Moderate episode of recurrent major depressive disorder (HCC) Continue f/u with [facility]. Refill provieded for zoleft  4. Bipolar affective disorder, currently depressed, moderate (HCC) Same as above  5. Tobacco use disorder Patient never completed paper work for [employer]. I recommended she follow-up with the medication management clinic. Smoking cessation advice given  6. Morbid obesity  (HCC) Weight loss advice given  7. Type 2 diabetes mellitus with diabetic neuropathy, with long-term current use of insulin (HCC) Continue apidra insulin, levemir and metformin. Daily exercise amd carb modified diet recommended  8. Constipation, unspecified constipation type Miralax 17 grams po bid; hold for loose stools. Continue dulcolax  9. Bloody stool Patient advised to have his new PCP schedule colonoscopy appointment.   Shawn Route, NP   [facility]

## 2018-02-25 NOTE — Progress Notes (Signed)
Per Pt he is here bc his triglycerides is high. Pt c/o back pain, shoulder pain and stomach pain pain is at an 8 on all three.  He is also moving to [facility] after this visit.

## 2018-03-05 ENCOUNTER — Telehealth: Payer: Self-pay | Admitting: Pharmacist

## 2018-03-05 NOTE — Telephone Encounter (Signed)
03/05/2018 9:11:37 AM - Lantus Solostar refill  03/05/18 Faxed [employer] refill request for Lantus Solostar Inject 65 units under the skin every morning #4.Forde RadonAJ

## 2018-03-18 ENCOUNTER — Encounter: Payer: Self-pay | Admitting: Family Medicine

## 2018-03-18 ENCOUNTER — Other Ambulatory Visit: Payer: Self-pay

## 2018-03-18 ENCOUNTER — Ambulatory Visit: Payer: Medicaid Other | Admitting: Family Medicine

## 2018-03-18 VITALS — BP 94/68 | HR 78 | Temp 98.6°F | Ht 71.0 in | Wt 269.4 lb

## 2018-03-18 DIAGNOSIS — E1140 Type 2 diabetes mellitus with diabetic neuropathy, unspecified: Secondary | ICD-10-CM

## 2018-03-18 DIAGNOSIS — Z7689 Persons encountering health services in other specified circumstances: Secondary | ICD-10-CM | POA: Diagnosis not present

## 2018-03-18 DIAGNOSIS — F331 Major depressive disorder, recurrent, moderate: Secondary | ICD-10-CM

## 2018-03-18 DIAGNOSIS — F3132 Bipolar disorder, current episode depressed, moderate: Secondary | ICD-10-CM

## 2018-03-18 DIAGNOSIS — N529 Male erectile dysfunction, unspecified: Secondary | ICD-10-CM

## 2018-03-18 DIAGNOSIS — I10 Essential (primary) hypertension: Secondary | ICD-10-CM

## 2018-03-18 DIAGNOSIS — Z23 Encounter for immunization: Secondary | ICD-10-CM

## 2018-03-18 DIAGNOSIS — E782 Mixed hyperlipidemia: Secondary | ICD-10-CM

## 2018-03-18 DIAGNOSIS — Z794 Long term (current) use of insulin: Secondary | ICD-10-CM

## 2018-03-18 DIAGNOSIS — K921 Melena: Secondary | ICD-10-CM

## 2018-03-18 DIAGNOSIS — M545 Low back pain: Secondary | ICD-10-CM

## 2018-03-18 DIAGNOSIS — G8929 Other chronic pain: Secondary | ICD-10-CM

## 2018-03-18 MED ORDER — SILDENAFIL CITRATE 50 MG PO TABS: 50.0000 mg | ORAL_TABLET | Freq: Every day | ORAL | 3 refills | Status: DC | PRN

## 2018-03-18 MED ORDER — NAPROXEN 500 MG PO TABS: 500.0000 mg | ORAL_TABLET | Freq: Two times a day (BID) | ORAL | 1 refills | Status: DC

## 2018-03-18 MED ORDER — AMLODIPINE BESYLATE 5 MG PO TABS: 5.0000 mg | ORAL_TABLET | Freq: Every day | ORAL | 3 refills | Status: DC

## 2018-03-18 MED ORDER — DAPAGLIFLOZIN PROPANEDIOL 5 MG PO TABS: 5.0000 mg | ORAL_TABLET | Freq: Every day | ORAL | 1 refills | Status: DC

## 2018-03-18 MED ORDER — FENOFIBRATE 54 MG PO TABS: 54.0000 mg | ORAL_TABLET | Freq: Every day | ORAL | 1 refills | Status: DC

## 2018-03-18 MED ORDER — ATORVASTATIN CALCIUM 40 MG PO TABS: 40.0000 mg | ORAL_TABLET | Freq: Every day | ORAL | 3 refills | Status: DC

## 2018-03-18 NOTE — Patient Instructions (Addendum)
Increase apidra to 10 units.   You can go to [facility] to get the x-ray of your back [address], [place], [place] [address]    Diabetes Mellitus and Nutrition When you have diabetes (diabetes mellitus), it is very important to have healthy eating habits because your blood sugar (glucose) levels are greatly affected by what you eat and drink. Eating healthy foods in the appropriate amounts, at about the same times every day, can help you:  Control your blood glucose.  Lower your risk of heart disease.  Improve your blood pressure.  Reach or maintain a healthy weight.  Every person with diabetes is different, and each person has different needs for a meal plan. Your health care provider may recommend that you work with a diet and nutrition specialist (dietitian) to make a meal plan that is best for you. Your meal plan may vary depending on factors such as:  The calories you need.  The medicines you take.  Your weight.  Your blood glucose, blood pressure, and cholesterol levels.  Your activity level.  Other health conditions you have, such as heart or kidney disease.  How do carbohydrates affect me? Carbohydrates affect your blood glucose level more than any other type of food. Eating carbohydrates naturally increases the amount of glucose in your blood. Carbohydrate counting is a method for keeping track of how many carbohydrates you eat. Counting carbohydrates is important to keep your blood glucose at a healthy level, especially if you use insulin or take certain oral diabetes medicines. It is important to know how many carbohydrates you can safely have in each meal. This is different for every person. Your dietitian can help you calculate how many carbohydrates you should have at each meal and for snack. Foods that contain carbohydrates include:  Bread, cereal, rice, pasta, and crackers.  Potatoes and corn.  Peas, beans, and lentils.  Milk and yogurt.  Fruit  and juice.  Desserts, such as cakes, cookies, ice cream, and candy.  How does alcohol affect me? Alcohol can cause a sudden decrease in blood glucose (hypoglycemia), especially if you use insulin or take certain oral diabetes medicines. Hypoglycemia can be a life-threatening condition. Symptoms of hypoglycemia (sleepiness, dizziness, and confusion) are similar to symptoms of having too much alcohol. If your health care provider says that alcohol is safe for you, follow these guidelines:  Limit alcohol intake to no more than 1 drink per day for nonpregnant women and 2 drinks per day for men. One drink equals 12 oz of beer, 5 oz of wine, or 1 oz of hard liquor.  Do not drink on an empty stomach.  Keep yourself hydrated with water, diet soda, or unsweetened iced tea.  Keep in mind that regular soda, juice, and other mixers may contain a lot of sugar and must be counted as carbohydrates.  What are tips for following this plan? Reading food labels  Start by checking the serving size on the label. The amount of calories, carbohydrates, fats, and other nutrients listed on the label are based on one serving of the food. Many foods contain more than one serving per package.  Check the total grams (g) of carbohydrates in one serving. You can calculate the number of servings of carbohydrates in one serving by dividing the total carbohydrates by 15. For example, if a food has 30 g of total carbohydrates, it would be equal to 2 servings of carbohydrates.  Check the number of grams (g) of  saturated and trans fats in one serving. Choose foods that have low or no amount of these fats.  Check the number of milligrams (mg) of sodium in one serving. Most people should limit total sodium intake to less than 2,300 mg per day.  Always check the nutrition information of foods labeled as "low-fat" or "nonfat". These foods may be higher in added sugar or refined carbohydrates and should be avoided.  Talk to  your dietitian to identify your daily goals for nutrients listed on the label. Shopping  Avoid buying canned, premade, or processed foods. These foods tend to be high in fat, sodium, and added sugar.  Shop around the outside edge of the grocery store. This includes fresh fruits and vegetables, bulk grains, fresh meats, and fresh dairy. Cooking  Use low-heat cooking methods, such as baking, instead of high-heat cooking methods like deep frying.  Cook using healthy oils, such as olive, canola, or sunflower oil.  Avoid cooking with butter, cream, or high-fat meats. Meal planning  Eat meals and snacks regularly, preferably at the same times every day. Avoid going long periods of time without eating.  Eat foods high in fiber, such as fresh fruits, vegetables, beans, and whole grains. Talk to your dietitian about how many servings of carbohydrates you can eat at each meal.  Eat 4-6 ounces of lean protein each day, such as lean meat, chicken, fish, eggs, or tofu. 1 ounce is equal to 1 ounce of meat, chicken, or fish, 1 egg, or 1/4 cup of tofu.  Eat some foods each day that contain healthy fats, such as avocado, nuts, seeds, and fish. Lifestyle   Check your blood glucose regularly.  Exercise at least 30 minutes 5 or more days each week, or as told by your health care provider.  Take medicines as told by your health care provider.  Do not use any products that contain nicotine or tobacco, such as cigarettes and e-cigarettes. If you need help quitting, ask your health care provider.  Work with a Veterinary surgeon or diabetes educator to identify strategies to manage stress and any emotional and social challenges. What are some questions to ask my health care provider?  Do I need to meet with a diabetes educator?  Do I need to meet with a dietitian?  What number can I call if I have questions?  When are the best times to check my blood glucose? Where to find more information:  American  Diabetes Association: diabetes.org/food-and-fitness/food  Academy of Nutrition and Dietetics: https://www.vargas.com/  [employer] of Diabetes and Digestive and Kidney Diseases (NIH): FindJewelers.cz Summary  A healthy meal plan will help you control your blood glucose and maintain a healthy lifestyle.  Working with a diet and nutrition specialist (dietitian) can help you make a meal plan that is best for you.  Keep in mind that carbohydrates and alcohol have immediate effects on your blood glucose levels. It is important to count carbohydrates and to use alcohol carefully. This information is not intended to replace advice given to you by your health care provider. Make sure you discuss any questions you have with your health care provider. Document Released: 03/06/2005 Document Revised: 07/14/2016 Document Reviewed: 07/14/2016 Elsevier Interactive Patient Education  [employer].

## 2018-03-18 NOTE — Progress Notes (Signed)
BP 94/68   Pulse 78   Temp 98.6 F (37 C) (Oral)   Ht 5\' 11"  (1.803 m)   Wt 269 lb 6.4 oz (122.2 kg)   SpO2 97%   BMI 37.57 kg/m    Subjective:    Patient ID: Lance Vaughan, male    DOB: 01/09/1966, 52 y.o.   MRN: [id number]  HPI: Lance Vaughan is a 52 y.o. male  Chief Complaint  Patient presents with  . New Patient (Initial Visit)    pt would like to discuss about his back pain, diabetes, and how to lose weight/ states he is not sleeping well.   Patient presents today to establish care.   New dx of DM 04/2017. States he knows very little about the disease and how to manage it. BS at home have been quite variable, 230-280s regardless of meals. Currently on metformin 1000 mg BID as well as levemir 65 units and apidra 8 units BID. Does not take his apidra if his sugar is 130 or less prior to dose. Does not follow a particular diet at this time and does not exercise due to back issues. Denies low blood sugar spells. Last A1C 12/2017 was at 9.2.   Has had some issues with low blood pressure back a year ago. Passed out twice at that time and regimen has never been decreased since. Currently on amlodipine 10 mg, HCTZ 25 mg, losartan 100 mg, and metoprolol 50 mg BID.   Bloody stools discussed with previous clinic without diagnosis of hemorrhoids, requesting colonoscopy referral. Last Colonoscopy he believes was 2-3 years ago with removal of polyps. No known hx of colon cancer.   HLD - Noted in last labs 12/2017 to have significantly elevated TGs and LDL that was not able to be calculated. Not currently on cholesterol therapy.   Chronic back pain that is very bothersome to him. Some days requires a cane to walk around. 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 No known injury otherwise, never had imaging or workup. Currently on gabapentin with minimal relief.   Currently on cialis for ED, wanting to switch to viagra which he's done better on in the past. No side  effects.  Bipolar disorder and anxiety, currently managed by [facility] on just zoloft. States they refuse to treat his anxiety and he is very unhappy with the care he has received. Denies SI/HI but does have some mood swings, agitation, and significant anxiety issues per patient.   Relevant past medical, surgical, family and social history reviewed and updated as indicated. Interim medical history since our last visit reviewed. Allergies and medications reviewed and updated.  Review of Systems  Per HPI unless specifically indicated above     Objective:    BP 94/68   Pulse 78   Temp 98.6 F (37 C) (Oral)   Ht 5\' 11"  (1.803 m)   Wt 269 lb 6.4 oz (122.2 kg)   SpO2 97%   BMI 37.57 kg/m   Wt Readings from Last 3 Encounters:  03/18/18 269 lb 6.4 oz (122.2 kg)  02/25/18 274 lb 4.8 oz (124.4 kg)  12/31/17 271 lb 6.4 oz (123.1 kg)    Physical Exam  Constitutional: He is oriented to person, place, and time. He appears well-developed and well-nourished. No distress.  HENT:  Head: Atraumatic.  Eyes: Conjunctivae and EOM are normal.  Neck: Normal range of motion. Neck supple.  Cardiovascular: Normal rate and regular rhythm.  Pulmonary/Chest: Effort normal and breath  sounds normal.  Musculoskeletal: Normal range of motion.  Neurological: He is alert and oriented to person, place, and time.  Skin: Skin is warm and dry.  Psychiatric: He has a normal mood and affect. His behavior is normal.  Nursing note and vitals reviewed.   Results for orders placed or performed in visit on 12/31/17  HgB A1c  Result Value Ref Range   Hgb A1c MFr Bld 9.2 (H) 4.8 - 5.6 %   Est. average glucose Bld gHb Est-mCnc 217 mg/dL  CBC w/Diff  Result Value Ref Range   WBC 6.0 3.4 - 10.8 x10E3/uL   RBC 4.85 4.14 - 5.80 x10E6/uL   Hemoglobin 15.4 13.0 - 17.7 g/dL   Hematocrit 16.1 09.6 - 51.0 %   MCV 90 79 - 97 fL   MCH 31.8 26.6 - 33.0 pg   MCHC 35.2 31.5 - 35.7 g/dL   RDW 04.5 40.9 - 81.1 %   Platelets 287  150 - 450 x10E3/uL   Neutrophils 45 Not Estab. %   Lymphs 41 Not Estab. %   Monocytes 7 Not Estab. %   Eos 6 Not Estab. %   Basos 1 Not Estab. %   Neutrophils Absolute 2.7 1.4 - 7.0 x10E3/uL   Lymphocytes Absolute 2.5 0.7 - 3.1 x10E3/uL   Monocytes Absolute 0.4 0.1 - 0.9 x10E3/uL   EOS (ABSOLUTE) 0.4 0.0 - 0.4 x10E3/uL   Basophils Absolute 0.0 0.0 - 0.2 x10E3/uL   Immature Granulocytes 0 Not Estab. %   Immature Grans (Abs) 0.0 0.0 - 0.1 x10E3/uL  Comprehensive metabolic panel  Result Value Ref Range   Glucose 110 (H) 65 - 99 mg/dL   BUN 19 6 - 24 mg/dL   Creatinine, Ser 9.14 0.76 - 1.27 mg/dL   GFR calc non Af Amer 78 >59 mL/min/1.73   GFR calc Af Amer 90 >59 mL/min/1.73   BUN/Creatinine Ratio 17 9 - 20   Sodium 140 134 - 144 mmol/L   Potassium 4.1 3.5 - 5.2 mmol/L   Chloride 103 96 - 106 mmol/L   CO2 22 20 - 29 mmol/L   Calcium 9.0 8.7 - 10.2 mg/dL   Total Protein 6.2 6.0 - 8.5 g/dL   Albumin 4.1 3.5 - 5.5 g/dL   Globulin, Total 2.1 1.5 - 4.5 g/dL   Albumin/Globulin Ratio 2.0 1.2 - 2.2   Bilirubin Total <0.2 0.0 - 1.2 mg/dL   Alkaline Phosphatase 60 39 - 117 IU/L   AST 22 0 - 40 IU/L   ALT 37 0 - 44 IU/L  Lipid Profile  Result Value Ref Range   Cholesterol, Total 193 100 - 199 mg/dL   Triglycerides 782 (HH) 0 - 149 mg/dL   HDL 33 (L) >95 mg/dL   VLDL Cholesterol Cal Comment 5 - 40 mg/dL   LDL Calculated Comment 0 - 99 mg/dL   Chol/HDL Ratio 5.8 (H) 0.0 - 5.0 ratio      Assessment & Plan:   Problem List Items Addressed This Visit      Cardiovascular and Mediastinum   Hypertension - Primary    With hypotensive spells on current regimen. Decrease amlodipine to 5 mg, recheck next month. Check home readings and if still dizzy call sooner. Continue remainder of regimen      Relevant Medications   sildenafil (VIAGRA) 50 MG tablet   amLODipine (NORVASC) 5 MG tablet   atorvastatin (LIPITOR) 40 MG tablet   fenofibrate 54 MG tablet     Endocrine   Type  2 diabetes  mellitus with diabetic neuropathy, unspecified (HCC)    Long discussion about diet and exercise to control his blood sugars. Increase apidra to 10 units BID and add farxiga. Continue current regimen otherwise. Will recheck at upcoming CPE next month      Relevant Medications   dapagliflozin propanediol (FARXIGA) 5 MG TABS tablet   atorvastatin (LIPITOR) 40 MG tablet     Other   Hyperlipidemia    Will start lipitor and fenofibrate, discussed dietary changes for better control. Recheck at CPE      Relevant Medications   sildenafil (VIAGRA) 50 MG tablet   amLODipine (NORVASC) 5 MG tablet   atorvastatin (LIPITOR) 40 MG tablet   fenofibrate 54 MG tablet   Major depression, recurrent (HCC)    Referral placed to Psychiatry for second opinion, continue with [facility] until established elsewhere      Relevant Orders   Ambulatory referral to Psychiatry   Bipolar disorder [facility])    Referral placed for new Psychiatry consult as he's under poor control currently. Was previously on lithium but taken off at [facility]. Follow up with [facility] until established elsewhere      Relevant Orders   Ambulatory referral to Psychiatry   Morbid obesity (HCC)    Lifestyle modifications reviewed at length      Relevant Medications   dapagliflozin propanediol (FARXIGA) 5 MG TABS tablet   Erectile dysfunction    Switch from cialis to viagra per patient request. Precautions reviewed       Other Visit Diagnoses    Encounter to establish care       Flu vaccine need       Relevant Orders   Flu Vaccine QUAD 36+ mos IM (Completed)   Bloody stools       GI referral placed per pt request. Declines exam today   Relevant Orders   Ambulatory referral to Gastroenterology   Chronic bilateral low back pain without sciatica       Continue gabapentin, will obtain low back x-ray. Lidocaine patches, heating pad recommended   Relevant Medications   naproxen (NAPROSYN) 500 MG tablet   Other Relevant Orders   DG Lumbar Spine  Complete       Follow up plan: Return in about 4 weeks (around 04/15/2018) for CPE.

## 2018-03-19 NOTE — Assessment & Plan Note (Signed)
With hypotensive spells on current regimen. Decrease amlodipine to 5 mg, recheck next month. Check home readings and if still dizzy call sooner. Continue remainder of regimen

## 2018-03-19 NOTE — Assessment & Plan Note (Signed)
Referral placed to Psychiatry for second opinion, continue with [facility] until established elsewhere

## 2018-03-19 NOTE — Assessment & Plan Note (Signed)
Switch from cialis to viagra per patient request. Precautions reviewed

## 2018-03-19 NOTE — Assessment & Plan Note (Signed)
Lifestyle modifications reviewed at length

## 2018-03-19 NOTE — Assessment & Plan Note (Signed)
Will start lipitor and fenofibrate, discussed dietary changes for better control. Recheck at CPE

## 2018-03-19 NOTE — Assessment & Plan Note (Signed)
Long discussion about diet and exercise to control his blood sugars. Increase apidra to 10 units BID and add farxiga. Continue current regimen otherwise. Will recheck at upcoming CPE next month

## 2018-03-19 NOTE — Assessment & Plan Note (Signed)
Referral placed for new Psychiatry consult as he's under poor control currently. Was previously on lithium but taken off at [facility]. Follow up with [facility] until established elsewhere

## 2018-03-25 ENCOUNTER — Telehealth: Payer: Self-pay | Admitting: Pharmacist

## 2018-03-25 ENCOUNTER — Telehealth: Payer: Self-pay

## 2018-03-25 NOTE — Telephone Encounter (Signed)
03/25/2018 11:49:13 AM - Apidra Solostar refill  03/25/18 I am sending [employer] refill request to [facility] for Teah to sign for Apidra Solostar pen Inject 8 units under the skin twice daily with meals #1.Forde Radon

## 2018-03-25 NOTE — Telephone Encounter (Signed)
Farxiga 5mg  approval from 03/25/18 to 03/20/2019  Approval Code: [id number]

## 2018-03-29 ENCOUNTER — Telehealth: Payer: Self-pay | Admitting: Pharmacist

## 2018-03-29 NOTE — Telephone Encounter (Signed)
03/29/2018 4:24:15 PM - Cialis  03/29/18 Faxed [employer] application for Cialis 20mg  Take one tablet by mouth every day as needed for erectile dysfunction. The company will send 30 pills per every 6 months, the provider was informed of this information-when medication comes in-we dispense 5 per month, the pharmacy team will explain to patient. Lance Vaughan

## 2018-04-01 ENCOUNTER — Other Ambulatory Visit: Payer: Self-pay

## 2018-04-01 ENCOUNTER — Encounter: Payer: Self-pay | Admitting: Gastroenterology

## 2018-04-01 ENCOUNTER — Ambulatory Visit (INDEPENDENT_AMBULATORY_CARE_PROVIDER_SITE_OTHER): Payer: Medicaid Other | Admitting: Gastroenterology

## 2018-04-01 VITALS — BP 111/75 | HR 95 | Resp 17 | Ht 71.0 in | Wt 274.0 lb

## 2018-04-01 DIAGNOSIS — K625 Hemorrhage of anus and rectum: Secondary | ICD-10-CM | POA: Diagnosis not present

## 2018-04-01 NOTE — Progress Notes (Signed)
Arlyss Repress, MD [address]  [address]  [place], [place] [address]  Main: [phone]  Fax: [phone]    Gastroenterology Consultation  Referring Provider:     Particia Nearing,* Primary Care Physician:  Steele Sizer, MD Primary Gastroenterologist:  Dr. Arlyss Repress Reason for Consultation:     Rectal bleeding        HPI:   Lance Vaughan is a 52 y.o. male referred by Dr. Steele Sizer, MD  for consultation & management of rectal bleeding.  He has history of bipolar, depression, metabolic syndrome, diabetes on insulin.  Approximately 2 weeks ago, patient noticed 2 days of rectal bleeding per rectum associated with blood clots.  He has intermittent constipation for which he takes stool softeners as needed.  He is not experiencing bleeding anymore.  He denies any other GI symptoms. He has left-sided diverticulosis based on the CT NSAIDs: None  Antiplts/Anticoagulants/Anti thrombotics: None He denies family history of colon cancer He smokes tobacco, half pack per day GI Procedures:  Colonoscopy 09/13/2015 at [facility], report not available  A: Rectum, biopsy - Lymphoid nodule (1 fragment) - Colonic mucosa with slight hyperplastic features (1 fragment)   Past Medical History:  Diagnosis Date  . Arthritis   . Bipolar disorder (HCC)   . Bursitis   . Depression   . Diabetes mellitus without complication (HCC)   . Diverticulitis   . Hyperlipidemia   . Hypertension   . Psychosis (HCC)   . Psychosis (HCC)     No past surgical history on file.  Current Outpatient Medications:  .  amLODipine (NORVASC) 5 MG tablet, Take 1 tablet (5 mg total) by mouth daily., Disp: 90 tablet, Rfl: 3 .  aspirin EC 81 MG tablet, Take 1 tablet (81 mg total) by mouth daily., Disp: 90 tablet, Rfl: 0 .  atorvastatin (LIPITOR) 40 MG tablet, Take 1 tablet (40 mg total) by mouth daily., Disp: 90 tablet, Rfl: 3 .  dapagliflozin propanediol (FARXIGA) 5 MG TABS tablet, Take 5 mg by  mouth daily., Disp: 30 tablet, Rfl: 1 .  fenofibrate 54 MG tablet, Take 1 tablet (54 mg total) by mouth daily., Disp: 30 tablet, Rfl: 1 .  gabapentin (NEURONTIN) 100 MG capsule, Take 1 capsule (100 mg total) by mouth 3 (three) times daily., Disp: 100 capsule, Rfl: 2 .  glucose blood test strip, Use as instructed, Disp: 100 each, Rfl: 12 .  hydrochlorothiazide (HYDRODIURIL) 25 MG tablet, Take 1 tablet (25 mg total) by mouth 2 (two) times daily., Disp: 60 tablet, Rfl: 3 .  insulin detemir (LEVEMIR) 100 unit/ml SOLN, Inject 0.65 mLs (65 Units total) into the skin at bedtime., Disp: 10 mL, Rfl: 11 .  insulin glulisine (APIDRA) 100 UNIT/ML injection, Inject 0.08 mLs (8 Units total) into the skin 2 (two) times daily after a meal. Before dinner (Patient taking differently: Inject 10 Units into the skin 2 (two) times daily after a meal. Before dinner), Disp: 10 mL, Rfl: 11 .  losartan (COZAAR) 100 MG tablet, Take 1 tablet (100 mg total) by mouth daily., Disp: 30 tablet, Rfl: 3 .  metFORMIN (GLUCOPHAGE) 1000 MG tablet, Take 1 tablet (1,000 mg total) by mouth 2 (two) times daily with a meal., Disp: 60 tablet, Rfl: 3 .  metoprolol tartrate (LOPRESSOR) 50 MG tablet, Take 1 tablet (50 mg total) by mouth 2 (two) times daily., Disp: 60 tablet, Rfl: 3 .  naproxen (NAPROSYN) 500 MG tablet, Take 1 tablet (500 mg  total) by mouth 2 (two) times daily with a meal., Disp: 60 tablet, Rfl: 1 .  sertraline (ZOLOFT) 100 MG tablet, Take 1 tablet (100 mg total) by mouth daily., Disp: 30 tablet, Rfl: 0 .  sildenafil (VIAGRA) 50 MG tablet, Take 1 tablet (50 mg total) by mouth daily as needed for erectile dysfunction., Disp: 10 tablet, Rfl: 3    Family History  Problem Relation Age of Onset  . Hypertension Mother   . Hypertension Sister   . Hyperlipidemia Sister   . Hyperlipidemia Sister   . Hypertension Sister   . Diabetes Maternal Grandmother      Social History   Tobacco Use  . Smoking status: Current Every Day  Smoker    Packs/day: 0.50    Years: 30.00    Pack years: 15.00  . Smokeless tobacco: Never Used  Substance Use Topics  . Alcohol use: No    Frequency: Never  . Drug use: No    Allergies as of 04/01/2018 - Review Complete 04/01/2018  Allergen Reaction Noted  . Lisinopril Other (See Comments) 05/02/2017    Review of Systems:    All systems reviewed and negative except where noted in HPI.   Physical Exam:  BP 111/75 (BP Location: Left Arm, Patient Position: Sitting, Cuff Size: Large)   Pulse 95   Resp 17   Ht 5\' 11"  (1.803 m)   Wt 274 lb (124.3 kg)   BMI 38.22 kg/m  No LMP for male patient.  General:   Alert,  Well-developed, well-nourished, pleasant and cooperative in NAD Head:  Normocephalic and atraumatic. Eyes:  Sclera clear, no icterus.   Conjunctiva pink. Ears:  Normal auditory acuity. Nose:  No deformity, discharge, or lesions. Mouth:  No deformity or lesions,oropharynx pink & moist. Neck:  Supple; no masses or thyromegaly. Lungs:  Respirations even and unlabored.  Clear throughout to auscultation.   No wheezes, crackles, or rhonchi. No acute distress. Heart:  Regular rate and rhythm; no murmurs, clicks, rubs, or gallops. Abdomen:  Normal bowel sounds. Soft, obese, non-tender and non-distended without masses, hepatosplenomegaly or hernias noted.  No guarding or rebound tenderness.   Rectal: Not performed Msk:  Symmetrical without gross deformities. Good, equal movement & strength bilaterally. Pulses:  Normal pulses noted. Extremities:  No clubbing or edema.  No cyanosis. Neurologic:  Alert and oriented x3;  grossly normal neurologically. Skin:  Intact without significant lesions or rashes. No jaundice. Lymph Nodes:  No significant cervical adenopathy. Psych:  Alert and cooperative. Normal mood and affect.  Imaging Studies: Reviewed  Assessment and Plan:   Lance Vaughan is a 52 y.o. African-American male with metabolic syndrome, insulin-dependent diabetes  mellitus, left-sided diverticulosis, with 2 days of rectal bleeding, self-limited episode.   Rectal bleeding: Recommend colonoscopy for further evaluation Recommend high-fiber diet for intermittent constipation, handouts given   Follow up based on the colonoscopy results   Arlyss Repress, MD

## 2018-04-01 NOTE — Patient Instructions (Signed)
High-Fiber Diet  Fiber, also called dietary fiber, is a type of carbohydrate found in fruits, vegetables, whole grains, and beans. A high-fiber diet can have many health benefits. Your health care provider may recommend a high-fiber diet to help:  · Prevent constipation. Fiber can make your bowel movements more regular.  · Lower your cholesterol.  · Relieve hemorrhoids, uncomplicated diverticulosis, or irritable bowel syndrome.  · Prevent overeating as part of a weight-loss plan.  · Prevent heart disease, type 2 diabetes, and certain cancers.    What is my plan?  The recommended daily intake of fiber includes:  · 38 grams for men under age 50.  · 30 grams for men over age 50.  · 25 grams for women under age 50.  · 21 grams for women over age 50.    You can get the recommended daily intake of dietary fiber by eating a variety of fruits, vegetables, grains, and beans. Your health care provider may also recommend a fiber supplement if it is not possible to get enough fiber through your diet.  What do I need to know about a high-fiber diet?  · Fiber supplements have not been widely studied for their effectiveness, so it is better to get fiber through food sources.  · Always check the fiber content on the nutrition facts label of any prepackaged food. Look for foods that contain at least 5 grams of fiber per serving.  · Ask your dietitian if you have questions about specific foods that are related to your condition, especially if those foods are not listed in the following section.  · Increase your daily fiber consumption gradually. Increasing your intake of dietary fiber too quickly may cause bloating, cramping, or gas.  · Drink plenty of water. Water helps you to digest fiber.  What foods can I eat?  Grains  Whole-grain breads. Multigrain cereal. Oats and oatmeal. Brown rice. Barley. Bulgur wheat. Millet. Bran muffins. Popcorn. Rye wafer crackers.  Vegetables   Sweet potatoes. Spinach. Kale. Artichokes. Cabbage. Broccoli. Green peas. Carrots. Squash.  Fruits  Berries. Pears. Apples. Oranges. Avocados. Prunes and raisins. Dried figs.  Meats and Other Protein Sources  Navy, kidney, pinto, and soy beans. Split peas. Lentils. Nuts and seeds.  Dairy  Fiber-fortified yogurt.  Beverages  Fiber-fortified soy milk. Fiber-fortified orange juice.  Other  Fiber bars.  The items listed above may not be a complete list of recommended foods or beverages. Contact your dietitian for more options.  What foods are not recommended?  Grains  White bread. Pasta made with refined flour. White rice.  Vegetables  Fried potatoes. Canned vegetables. Well-cooked vegetables.  Fruits  Fruit juice. Cooked, strained fruit.  Meats and Other Protein Sources  Fatty cuts of meat. Fried poultry or fried fish.  Dairy  Milk. Yogurt. Cream cheese. Sour cream.  Beverages  Soft drinks.  Other  Cakes and pastries. Butter and oils.  The items listed above may not be a complete list of foods and beverages to avoid. Contact your dietitian for more information.  What are some tips for including high-fiber foods in my diet?  · Eat a wide variety of high-fiber foods.  · Make sure that half of all grains consumed each day are whole grains.  · Replace breads and cereals made from refined flour or white flour with whole-grain breads and cereals.  · Replace white rice with brown rice, bulgur wheat, or millet.  · Start the day with a breakfast that is high in fiber,   such as a cereal that contains at least 5 grams of fiber per serving.  · Use beans in place of meat in soups, salads, or pasta.  · Eat high-fiber snacks, such as berries, raw vegetables, nuts, or popcorn.  This information is not intended to replace advice given to you by your health care provider. Make sure you discuss any questions you have with your health care provider.  Document Released: 06/09/2005 Document Revised: 11/15/2015 Document Reviewed: 11/22/2013   Elsevier Interactive Patient Education © 2018 Elsevier Inc.

## 2018-04-02 ENCOUNTER — Telehealth: Payer: Self-pay | Admitting: Gastroenterology

## 2018-04-02 NOTE — Telephone Encounter (Signed)
Pt needs to reschedule his procedure for anytime after Nov. 3rd please call

## 2018-04-06 ENCOUNTER — Telehealth: Payer: Self-pay

## 2018-04-06 NOTE — Telephone Encounter (Signed)
Returned patients call to reschedule his colonoscopy from 10/22 to 11/11.  Trish in Endoscopy has been informed and I will update the referral in workque.  Thanks [employer]

## 2018-04-07 ENCOUNTER — Other Ambulatory Visit: Payer: Self-pay | Admitting: Internal Medicine

## 2018-04-15 ENCOUNTER — Other Ambulatory Visit: Payer: Self-pay

## 2018-04-15 ENCOUNTER — Telehealth: Payer: Self-pay | Admitting: Family Medicine

## 2018-04-15 ENCOUNTER — Ambulatory Visit (INDEPENDENT_AMBULATORY_CARE_PROVIDER_SITE_OTHER): Payer: Medicaid Other | Admitting: Family Medicine

## 2018-04-15 ENCOUNTER — Encounter: Payer: Self-pay | Admitting: Family Medicine

## 2018-04-15 VITALS — BP 128/80 | HR 90 | Temp 98.5°F | Ht 72.0 in | Wt 270.0 lb

## 2018-04-15 DIAGNOSIS — Z114 Encounter for screening for human immunodeficiency virus [HIV]: Secondary | ICD-10-CM

## 2018-04-15 DIAGNOSIS — F3132 Bipolar disorder, current episode depressed, moderate: Secondary | ICD-10-CM

## 2018-04-15 DIAGNOSIS — Z0000 Encounter for general adult medical examination without abnormal findings: Secondary | ICD-10-CM | POA: Diagnosis not present

## 2018-04-15 DIAGNOSIS — E1140 Type 2 diabetes mellitus with diabetic neuropathy, unspecified: Secondary | ICD-10-CM | POA: Diagnosis not present

## 2018-04-15 DIAGNOSIS — I10 Essential (primary) hypertension: Secondary | ICD-10-CM

## 2018-04-15 DIAGNOSIS — E782 Mixed hyperlipidemia: Secondary | ICD-10-CM

## 2018-04-15 DIAGNOSIS — K921 Melena: Secondary | ICD-10-CM

## 2018-04-15 DIAGNOSIS — Z794 Long term (current) use of insulin: Secondary | ICD-10-CM

## 2018-04-15 DIAGNOSIS — F331 Major depressive disorder, recurrent, moderate: Secondary | ICD-10-CM | POA: Diagnosis not present

## 2018-04-15 DIAGNOSIS — N529 Male erectile dysfunction, unspecified: Secondary | ICD-10-CM

## 2018-04-15 DIAGNOSIS — B351 Tinea unguium: Secondary | ICD-10-CM

## 2018-04-15 MED ORDER — AMLODIPINE BESYLATE 10 MG PO TABS: 10.0000 mg | ORAL_TABLET | Freq: Every day | ORAL | 1 refills | Status: DC

## 2018-04-15 MED ORDER — FENOFIBRATE 54 MG PO TABS: 54.0000 mg | ORAL_TABLET | Freq: Every day | ORAL | 1 refills | Status: DC

## 2018-04-15 MED ORDER — ATORVASTATIN CALCIUM 40 MG PO TABS: 40.0000 mg | ORAL_TABLET | Freq: Every day | ORAL | 1 refills | Status: DC

## 2018-04-15 MED ORDER — DAPAGLIFLOZIN PROPANEDIOL 5 MG PO TABS: 5.0000 mg | ORAL_TABLET | Freq: Every day | ORAL | 1 refills | Status: DC

## 2018-04-15 MED ORDER — SILDENAFIL CITRATE 100 MG PO TABS: 50.0000 mg | ORAL_TABLET | Freq: Every day | ORAL | 11 refills | Status: DC | PRN

## 2018-04-15 MED ORDER — PEN NEEDLES 32G X 4 MM MISC: 1.0000 [IU] | Freq: Three times a day (TID) | 11 refills | Status: DC | PRN

## 2018-04-15 NOTE — Patient Instructions (Signed)
[facility] 

## 2018-04-15 NOTE — Progress Notes (Signed)
BP 128/80   Pulse 90   Temp 98.5 F (36.9 C) (Oral)   Ht 6' (1.829 m)   Wt 270 lb (122.5 kg)   SpO2 97%   BMI 36.62 kg/m    Subjective:    Patient ID: Lance Vaughan, male    DOB: 04/23/1966, 52 y.o.   MRN: [id number]  HPI: Lance Vaughan is a 52 y.o. male presenting on 04/15/2018 for comprehensive medical examination. Current medical complaints include:see below  Main concern today is his back pain, which per patient is worsening. X-ray ordered at initial visit last month but pt did not go get it done. Taking gabapentin and naproxen with minimal relief. Not trying any exercises or home remedies.   Both great toenails are painful and thickened and have been for years. OTC fungal treatments have never worked for him. Also now struggling to trim his toenails and scared to do so now that he's been dx'd with diabetes.    DM - Needing new glucometer kit. 90s-234 highest. Has not started the farxiga. Taking 65 units of levemir and 10 units BID apidra as well as metformin. Has not made any lifestyle changes due to back pain and having trouble getting rid of the fried foods.   Has not been able to pick up his cholesterol medication yet (fenofibrate and higher dose of lipitor) but has been taking his old lipitor at the new dose (taking 4 of the 10 mg tabs). Denies side effects, Cp, SOB, claudication.   BP - Stopped the HCTZ two days ago and no longer taking the losartan or the metoprolol. Just on amlodipine 10 mg. Was getting very dizzy when taking all the medicines.   Following with [facility] currently for moods, awaiting establishment with another Psychiatrist. Currently taking zoloft. No major mood swings, SI/HI.   He currently lives with: Interim Problems from his last visit: yes  Depression Screen done today and results listed below:  Depression screen [facility] 2/9 04/15/2018 03/18/2018  Decreased Interest 1 3  Down, Depressed, Hopeless 1 3  PHQ - 2 Score 2 6  Altered sleeping 2 3  Tired, decreased  energy 3 3  Change in appetite 3 2  Feeling bad or failure about yourself  1 3  Trouble concentrating 2 2  Moving slowly or fidgety/restless 3 1  Suicidal thoughts 0 2  PHQ-9 Score 16 22    The patient does not have a history of falls. I did not complete a risk assessment for falls. A plan of care for falls was not documented.   Past Medical History:  Past Medical History:  Diagnosis Date  . Arthritis   . Bipolar disorder ([place])   . Bursitis   . Depression   . Diabetes mellitus without complication ([place])   . Diverticulitis   . Hyperlipidemia   . Hypertension   . Psychosis ([place])   . Psychosis [facility])     Surgical History:  History reviewed. No pertinent surgical history.  Medications:  Current Outpatient Medications on File Prior to Visit  Medication Sig  . aspirin EC 81 MG tablet Take 1 tablet (81 mg total) by mouth daily.  Marland Kitchen gabapentin (NEURONTIN) 100 MG capsule Take 1 capsule (100 mg total) by mouth 3 (three) times daily.  Marland Kitchen glucose blood test strip Use as instructed  . insulin detemir (LEVEMIR) 100 unit/ml SOLN Inject 0.65 mLs (65 Units total) into the skin at bedtime.  . insulin glulisine (APIDRA) 100 UNIT/ML injection Inject 0.08 mLs (8 Units total)  into the skin 2 (two) times daily after a meal. Before dinner (Patient taking differently: Inject 10 Units into the skin 2 (two) times daily after a meal. Before dinner)  . metFORMIN (GLUCOPHAGE) 1000 MG tablet Take 1 tablet (1,000 mg total) by mouth 2 (two) times daily with a meal.  . naproxen (NAPROSYN) 500 MG tablet Take 1 tablet (500 mg total) by mouth 2 (two) times daily with a meal.  . sertraline (ZOLOFT) 100 MG tablet Take 1 tablet (100 mg total) by mouth daily.   No current facility-administered medications on file prior to visit.     Allergies:  Allergies  Allergen Reactions  . Lisinopril Other (See Comments)    Chest pain    Social History:  Social History   Socioeconomic History  . Marital status:  Single    Spouse name: Not on file  . Number of children: 4  . Years of education: Not on file  . Highest education level: GED or equivalent  Occupational History  . Occupation: disability  Social Needs  . Financial resource strain: Very hard  . Food insecurity:    Worry: Often true    Inability: Sometimes true  . Transportation needs:    Medical: Yes    Non-medical: No  Tobacco Use  . Smoking status: Current Every Day Smoker    Packs/day: 0.50    Years: 30.00    Pack years: 15.00  . Smokeless tobacco: Never Used  Substance and Sexual Activity  . Alcohol use: No    Frequency: Never  . Drug use: No  . Sexual activity: Yes  Lifestyle  . Physical activity:    Days per week: 3 days    Minutes per session: 10 min  . Stress: Rather much  Relationships  . Social connections:    Talks on phone: Once a week    Gets together: Never    Attends religious service: Never    Active member of club or organization: No    Attends meetings of clubs or organizations: Never    Relationship status: Separated  . Intimate partner violence:    Fear of current or ex partner: No    Emotionally abused: No    Physically abused: No    Forced sexual activity: No  Other Topics Concern  . Not on file  Social History Narrative   Patient is currently living with his ex-wife, but is looking for other housing options.  He gets food when he has a ride to the foodbank.   Social History   Tobacco Use  Smoking Status Current Every Day Smoker  . Packs/day: 0.50  . Years: 30.00  . Pack years: 15.00  Smokeless Tobacco Never Used   Social History   Substance and Sexual Activity  Alcohol Use No  . Frequency: Never    Family History:  Family History  Problem Relation Age of Onset  . Hypertension Mother   . Hypertension Sister   . Hyperlipidemia Sister   . Hyperlipidemia Sister   . Hypertension Sister   . Diabetes Maternal Grandmother     Past medical history, surgical history,  medications, allergies, family history and social history reviewed with patient today and changes made to appropriate areas of the chart.   Review of Systems - General ROS: negative Psychological ROS: negative Ophthalmic ROS: negative ENT ROS: negative Allergy and Immunology ROS: negative Hematological and Lymphatic ROS: negative Endocrine ROS: negative Respiratory ROS: no cough, shortness of breath, or wheezing Cardiovascular ROS: no chest  pain or dyspnea on exertion Gastrointestinal ROS: positive for - rectal bleeding - colonoscopy scheduled for next month Genito-Urinary ROS: positive for - erectile dysfunction Musculoskeletal ROS: positive for - chronic back pain Neurological ROS: no TIA or stroke symptoms Dermatological ROS: positive for nail changes All other ROS negative except what is listed above and in the HPI.      Objective:    BP 128/80   Pulse 90   Temp 98.5 F (36.9 C) (Oral)   Ht 6' (1.829 m)   Wt 270 lb (122.5 kg)   SpO2 97%   BMI 36.62 kg/m   Wt Readings from Last 3 Encounters:  04/15/18 270 lb (122.5 kg)  04/01/18 274 lb (124.3 kg)  03/18/18 269 lb 6.4 oz (122.2 kg)    Physical Exam  Constitutional: He is oriented to person, place, and time. He appears well-developed and well-nourished. No distress.  HENT:  Head: Atraumatic.  Right Ear: External ear normal.  Left Ear: External ear normal.  Nose: Nose normal.  Mouth/Throat: Oropharynx is clear and moist.  Eyes: Pupils are equal, round, and reactive to light. Conjunctivae are normal. No scleral icterus.  Neck: Normal range of motion. Neck supple.  Cardiovascular: Normal rate, regular rhythm, normal heart sounds and intact distal pulses.  No murmur heard. Pulmonary/Chest: Effort normal and breath sounds normal. No respiratory distress.  Abdominal: Soft. Bowel sounds are normal. He exhibits no distension and no mass. There is no tenderness. There is no guarding.  Genitourinary:  Genitourinary Comments:  gu exam declined  Musculoskeletal: Normal range of motion. He exhibits no edema or tenderness.  Neurological: He is alert and oriented to person, place, and time. He has normal reflexes.  Skin: Skin is warm and dry. No rash noted.  Psychiatric: He has a normal mood and affect. His behavior is normal.  Nursing note and vitals reviewed.   Diabetic Foot Exam - Simple   Simple Foot Form Diabetic Foot exam was performed with the following findings:  Yes 04/15/2018 12:02 PM  Visual Inspection No deformities, no ulcerations, no other skin breakdown bilaterally:  Yes Sensation Testing Intact to touch and monofilament testing bilaterally:  Yes Pulse Check Posterior Tibialis and Dorsalis pulse intact bilaterally:  Yes Comments     Results for orders placed or performed in visit on 04/15/18  CBC with Differential/Platelet  Result Value Ref Range   WBC 5.9 3.4 - 10.8 x10E3/uL   RBC 4.46 4.14 - 5.80 x10E6/uL   Hemoglobin 14.4 13.0 - 17.7 g/dL   Hematocrit 41.4 37.5 - 51.0 %   MCV 93 79 - 97 fL   MCH 32.3 26.6 - 33.0 pg   MCHC 34.8 31.5 - 35.7 g/dL   RDW 13.4 12.3 - 15.4 %   Platelets 283 150 - 450 x10E3/uL   Neutrophils 43 Not Estab. %   Lymphs 40 Not Estab. %   Monocytes 9 Not Estab. %   Eos 7 Not Estab. %   Basos 1 Not Estab. %   Neutrophils Absolute 2.6 1.4 - 7.0 x10E3/uL   Lymphocytes Absolute 2.3 0.7 - 3.1 x10E3/uL   Monocytes Absolute 0.5 0.1 - 0.9 x10E3/uL   EOS (ABSOLUTE) 0.4 0.0 - 0.4 x10E3/uL   Basophils Absolute 0.0 0.0 - 0.2 x10E3/uL   Immature Granulocytes 0 Not Estab. %   Immature Grans (Abs) 0.0 0.0 - 0.1 x10E3/uL  Comprehensive metabolic panel  Result Value Ref Range   Glucose 85 65 - 99 mg/dL   BUN 12 6 -  24 mg/dL   Creatinine, Ser 1.05 0.76 - 1.27 mg/dL   GFR calc non Af Amer 81 >59 mL/min/1.73   GFR calc Af Amer 94 >59 mL/min/1.73   BUN/Creatinine Ratio 11 9 - 20   Sodium 142 134 - 144 mmol/L   Potassium 4.0 3.5 - 5.2 mmol/L   Chloride 107 (H) 96 - 106  mmol/L   CO2 19 (L) 20 - 29 mmol/L   Calcium 8.5 (L) 8.7 - 10.2 mg/dL   Total Protein 5.8 (L) 6.0 - 8.5 g/dL   Albumin 3.9 3.5 - 5.5 g/dL   Globulin, Total 1.9 1.5 - 4.5 g/dL   Albumin/Globulin Ratio 2.1 1.2 - 2.2   Bilirubin Total 0.3 0.0 - 1.2 mg/dL   Alkaline Phosphatase 51 39 - 117 IU/L   AST 27 0 - 40 IU/L   ALT 35 0 - 44 IU/L  Lipid Panel w/o Chol/HDL Ratio  Result Value Ref Range   Cholesterol, Total 136 100 - 199 mg/dL   Triglycerides 283 (H) 0 - 149 mg/dL   HDL 36 (L) >39 mg/dL   VLDL Cholesterol Cal 57 (H) 5 - 40 mg/dL   LDL Calculated 43 0 - 99 mg/dL  TSH  Result Value Ref Range   TSH 1.330 0.450 - 4.500 uIU/mL  UA/M w/rflx Culture, Routine  Result Value Ref Range   Specific Gravity, UA 1.020 1.005 - 1.030   pH, UA 5.5 5.0 - 7.5   Color, UA Orange Yellow   Appearance Ur Hazy (A) Clear   Leukocytes, UA Negative Negative   Protein, UA Negative Negative/Trace   Glucose, UA Negative Negative   Ketones, UA Negative Negative   RBC, UA Negative Negative   Bilirubin, UA Negative Negative   Urobilinogen, Ur 0.2 0.2 - 1.0 mg/dL   Nitrite, UA Negative Negative  HgB A1c  Result Value Ref Range   Hgb A1c MFr Bld 7.5 (H) 4.8 - 5.6 %   Est. average glucose Bld gHb Est-mCnc 169 mg/dL  HIV Antibody (routine testing w rflx)  Result Value Ref Range   HIV Screen 4th Generation wRfx Non Reactive Non Reactive      Assessment & Plan:   Problem List Items Addressed This Visit      Cardiovascular and Mediastinum   Hypertension - Primary    BPs stable on 10 mg amlodipine, d/c rest of BP regimen and continue on just the amlodipine. Monitor regularly at home. Will recheck at next f/u. Work on [employer], weight loss, exercise      Relevant Medications   amLODipine (NORVASC) 10 MG tablet   fenofibrate 54 MG tablet   atorvastatin (LIPITOR) 40 MG tablet   sildenafil (VIAGRA) 100 MG tablet   Other Relevant Orders   CBC with Differential/Platelet (Completed)   Comprehensive  metabolic panel (Completed)   TSH (Completed)     Endocrine   Type 2 diabetes mellitus with diabetic neuropathy, unspecified ([place])    Not yet started farxiga, and diet and exercise worse than before. Await A1C. Start farxiga and work hard on lifestyle modification. Recheck in 3 months. Foot exam completed, eye exam UTD.       Relevant Medications   atorvastatin (LIPITOR) 40 MG tablet   dapagliflozin propanediol (FARXIGA) 5 MG TABS tablet   Other Relevant Orders   UA/M w/rflx Culture, Routine (Completed)   HgB A1c (Completed)     Other   Hyperlipidemia    Recheck lipids, start fenofibrate and continue 40 mg lipitor. Work on  lifestyle modifications      Relevant Medications   amLODipine (NORVASC) 10 MG tablet   fenofibrate 54 MG tablet   atorvastatin (LIPITOR) 40 MG tablet   sildenafil (VIAGRA) 100 MG tablet   Other Relevant Orders   Lipid Panel w/o Chol/HDL Ratio (Completed)   Major depression, recurrent ([place])    Following with [facility], will establish soon with Psychiatry. Continue current regimen      Bipolar disorder [facility])    Awaiting establishment with Psychiatry, following with [facility] in meantime. Continue current regimen      Erectile dysfunction    Could not afford viagra through insurance. Coupon given for good rx.       Bloody stool    Following now with GI, scheduled for colonoscopy next month. Await results       Other Visit Diagnoses    Annual physical exam       Onychomycosis       Referral to podiatry placed for nail trims and antifungal tx   Relevant Orders   Ambulatory referral to Podiatry   Screening for HIV (human immunodeficiency virus)       Relevant Orders   HIV Antibody (routine testing w rflx) (Completed)       Discussed aspirin prophylaxis for myocardial infarction prevention and decision was made to continue ASA  LABORATORY TESTING:  Health maintenance labs ordered today as discussed above.   The natural history of prostate cancer and  ongoing controversy regarding screening and potential treatment outcomes of prostate cancer has been discussed with the patient. The meaning of a false positive PSA and a false negative PSA has been discussed. He indicates understanding of the limitations of this screening test and wishes not to proceed with screening PSA testing.   IMMUNIZATIONS:   - Tdap: Tetanus vaccination status reviewed: states he receives these from the donation center he goes to for plasma donation - will bring in record of last vaccination. - Influenza: Up to date - Pneumovax: Up to date  SCREENING: - Colonoscopy: scheduled for next month  Discussed with patient purpose of the colonoscopy is to detect colon cancer at curable precancerous or early stages   PATIENT COUNSELING:    Sexuality: Discussed sexually transmitted diseases, partner selection, use of condoms, avoidance of unintended pregnancy  and contraceptive alternatives.   Advised to avoid cigarette smoking.  I discussed with the patient that most people either abstain from alcohol or drink within safe limits (<=14/week and <=4 drinks/occasion for males, <=7/weeks and <= 3 drinks/occasion for females) and that the risk for alcohol disorders and other health effects rises proportionally with the number of drinks per week and how often a drinker exceeds daily limits.  Discussed cessation/primary prevention of drug use and availability of treatment for abuse.   Diet: Encouraged to adjust caloric intake to maintain  or achieve ideal body weight, to reduce intake of dietary saturated fat and total fat, to limit sodium intake by avoiding high sodium foods and not adding table salt, and to maintain adequate dietary potassium and calcium preferably from fresh fruits, vegetables, and low-fat dairy products.    stressed the importance of regular exercise  Injury prevention: Discussed safety belts, safety helmets, smoke detector, smoking near bedding or upholstery.    Dental health: Discussed importance of regular tooth brushing, flossing, and dental visits.   Follow up plan: NEXT PREVENTATIVE PHYSICAL DUE IN 1 YEAR. Return in about 3 months (around 07/16/2018) for A1C, BP recheck.

## 2018-04-15 NOTE — Telephone Encounter (Signed)
Copied from CRM [id number]. Topic: Quick Communication - Rx Refill/Question >> Apr 15, 2018  2:41 PM Gaynelle Adu wrote: Medication: Accu-Chek Aviva  with Strips   Has the patient contacted their pharmacy? Yes   Preferred Pharmacy (with phone number or street name): [facility] - [place], [place] - [address] [phone] (Phone) [phone] (Fax) Patient was seen today and stated he was advise that a meter and strip test would called in, the patient is at the pharmacy. Please advise

## 2018-04-15 NOTE — Telephone Encounter (Signed)
Form was just faxed over now. Should be ready later on today

## 2018-04-15 NOTE — Telephone Encounter (Signed)
Patient notified

## 2018-04-16 LAB — COMPREHENSIVE METABOLIC PANEL
A/G RATIO: 2.1 (ref 1.2–2.2)
ALT: 35 IU/L (ref 0–44)
AST: 27 IU/L (ref 0–40)
Albumin: 3.9 g/dL (ref 3.5–5.5)
Alkaline Phosphatase: 51 IU/L (ref 39–117)
BILIRUBIN TOTAL: 0.3 mg/dL (ref 0.0–1.2)
BUN / CREAT RATIO: 11 (ref 9–20)
BUN: 12 mg/dL (ref 6–24)
CHLORIDE: 107 mmol/L — AB (ref 96–106)
CO2: 19 mmol/L — ABNORMAL LOW (ref 20–29)
Calcium: 8.5 mg/dL — ABNORMAL LOW (ref 8.7–10.2)
Creatinine, Ser: 1.05 mg/dL (ref 0.76–1.27)
GFR calc non Af Amer: 81 mL/min/{1.73_m2} (ref >59)
GFR, EST AFRICAN AMERICAN: 94 mL/min/{1.73_m2} (ref >59)
GLOBULIN, TOTAL: 1.9 g/dL (ref 1.5–4.5)
Glucose: 85 mg/dL (ref 65–99)
Potassium: 4 mmol/L (ref 3.5–5.2)
SODIUM: 142 mmol/L (ref 134–144)
TOTAL PROTEIN: 5.8 g/dL — AB (ref 6.0–8.5)

## 2018-04-16 LAB — LIPID PANEL W/O CHOL/HDL RATIO
Cholesterol, Total: 136 mg/dL (ref 100–199)
HDL: 36 mg/dL — ABNORMAL LOW (ref >39)
LDL Calculated: 43 mg/dL (ref 0–99)
Triglycerides: 283 mg/dL — ABNORMAL HIGH (ref 0–149)
VLDL Cholesterol Cal: 57 mg/dL — ABNORMAL HIGH (ref 5–40)

## 2018-04-16 LAB — CBC WITH DIFFERENTIAL/PLATELET
BASOS: 1 %
Basophils Absolute: 0 10*3/uL (ref 0.0–0.2)
EOS (ABSOLUTE): 0.4 10*3/uL (ref 0.0–0.4)
Eos: 7 %
HEMATOCRIT: 41.4 % (ref 37.5–51.0)
Hemoglobin: 14.4 g/dL (ref 13.0–17.7)
IMMATURE GRANS (ABS): 0 10*3/uL (ref 0.0–0.1)
Immature Granulocytes: 0 %
LYMPHS: 40 %
Lymphocytes Absolute: 2.3 10*3/uL (ref 0.7–3.1)
MCH: 32.3 pg (ref 26.6–33.0)
MCHC: 34.8 g/dL (ref 31.5–35.7)
MCV: 93 fL (ref 79–97)
Monocytes Absolute: 0.5 10*3/uL (ref 0.1–0.9)
Monocytes: 9 %
NEUTROS ABS: 2.6 10*3/uL (ref 1.4–7.0)
Neutrophils: 43 %
PLATELETS: 283 10*3/uL (ref 150–450)
RBC: 4.46 x10E6/uL (ref 4.14–5.80)
RDW: 13.4 % (ref 12.3–15.4)
WBC: 5.9 10*3/uL (ref 3.4–10.8)

## 2018-04-16 LAB — UA/M W/RFLX CULTURE, ROUTINE
BILIRUBIN UA: NEGATIVE
Glucose, UA: NEGATIVE
Ketones, UA: NEGATIVE
Leukocytes, UA: NEGATIVE
Nitrite, UA: NEGATIVE
PH UA: 5.5 (ref 5.0–7.5)
Protein, UA: NEGATIVE
RBC UA: NEGATIVE
SPEC GRAV UA: 1.02 (ref 1.005–1.030)
Urobilinogen, Ur: 0.2 mg/dL (ref 0.2–1.0)

## 2018-04-16 LAB — HEMOGLOBIN A1C
Est. average glucose Bld gHb Est-mCnc: 169 mg/dL
HEMOGLOBIN A1C: 7.5 % — AB (ref 4.8–5.6)

## 2018-04-16 LAB — HIV ANTIBODY (ROUTINE TESTING W REFLEX): HIV SCREEN 4TH GENERATION: NONREACTIVE

## 2018-04-16 LAB — TSH: TSH: 1.33 u[IU]/mL (ref 0.450–4.500)

## 2018-04-18 NOTE — Assessment & Plan Note (Signed)
Could not afford viagra through insurance. Coupon given for good rx.

## 2018-04-18 NOTE — Assessment & Plan Note (Signed)
Following with [facility], will establish soon with Psychiatry. Continue current regimen

## 2018-04-18 NOTE — Assessment & Plan Note (Signed)
Recheck lipids, start fenofibrate and continue 40 mg lipitor. Work on lifestyle modifications

## 2018-04-18 NOTE — Assessment & Plan Note (Signed)
BPs stable on 10 mg amlodipine, d/c rest of BP regimen and continue on just the amlodipine. Monitor regularly at home. Will recheck at next f/u. Work on [employer], weight loss, exercise

## 2018-04-18 NOTE — Assessment & Plan Note (Signed)
Following now with GI, scheduled for colonoscopy next month. Await results

## 2018-04-18 NOTE — Assessment & Plan Note (Signed)
Awaiting establishment with Psychiatry, following with [facility] in meantime. Continue current regimen

## 2018-04-18 NOTE — Assessment & Plan Note (Signed)
Not yet started farxiga, and diet and exercise worse than before. Await A1C. Start farxiga and work hard on lifestyle modification. Recheck in 3 months. Foot exam completed, eye exam UTD.

## 2018-04-19 ENCOUNTER — Encounter: Payer: Self-pay | Admitting: Family Medicine

## 2018-04-19 ENCOUNTER — Telehealth: Payer: Self-pay | Admitting: Pharmacist

## 2018-04-19 NOTE — Telephone Encounter (Signed)
04/19/2018 10:16:28 AM - Apidra refill request to [facility]  04/19/18 Faxed [employer] refill request for [employer] Inject 8 units under the skin twice daily with meals #1.Lance Vaughan

## 2018-04-30 ENCOUNTER — Encounter: Payer: Self-pay | Admitting: *Deleted

## 2018-05-03 ENCOUNTER — Ambulatory Visit
Admission: RE | Admit: 2018-05-03 | Discharge: 2018-05-03 | Disposition: A | Discharge status: Home / Self Care | Payer: Medicaid Other | Source: Ambulatory Visit | Attending: Gastroenterology | Admitting: Gastroenterology

## 2018-05-03 ENCOUNTER — Encounter: Payer: Self-pay | Admitting: *Deleted

## 2018-05-03 ENCOUNTER — Ambulatory Visit: Payer: Medicaid Other | Admitting: Certified Registered Nurse Anesthetist

## 2018-05-03 ENCOUNTER — Encounter
Admission: RE | Disposition: A | Discharge status: Home / Self Care | Payer: Self-pay | Source: Ambulatory Visit | Attending: Gastroenterology

## 2018-05-03 DIAGNOSIS — F29 Unspecified psychosis not due to a substance or known physiological condition: Secondary | ICD-10-CM | POA: Insufficient documentation

## 2018-05-03 DIAGNOSIS — Z833 Family history of diabetes mellitus: Secondary | ICD-10-CM | POA: Diagnosis not present

## 2018-05-03 DIAGNOSIS — E785 Hyperlipidemia, unspecified: Secondary | ICD-10-CM | POA: Insufficient documentation

## 2018-05-03 DIAGNOSIS — Z8249 Family history of ischemic heart disease and other diseases of the circulatory system: Secondary | ICD-10-CM | POA: Diagnosis not present

## 2018-05-03 DIAGNOSIS — Z5309 Procedure and treatment not carried out because of other contraindication: Secondary | ICD-10-CM | POA: Diagnosis not present

## 2018-05-03 DIAGNOSIS — Z888 Allergy status to other drugs, medicaments and biological substances status: Secondary | ICD-10-CM | POA: Insufficient documentation

## 2018-05-03 DIAGNOSIS — Z79899 Other long term (current) drug therapy: Secondary | ICD-10-CM | POA: Diagnosis not present

## 2018-05-03 DIAGNOSIS — M1990 Unspecified osteoarthritis, unspecified site: Secondary | ICD-10-CM | POA: Diagnosis not present

## 2018-05-03 DIAGNOSIS — Z7982 Long term (current) use of aspirin: Secondary | ICD-10-CM | POA: Insufficient documentation

## 2018-05-03 DIAGNOSIS — M719 Bursopathy, unspecified: Secondary | ICD-10-CM | POA: Diagnosis not present

## 2018-05-03 DIAGNOSIS — E119 Type 2 diabetes mellitus without complications: Secondary | ICD-10-CM | POA: Insufficient documentation

## 2018-05-03 DIAGNOSIS — F17210 Nicotine dependence, cigarettes, uncomplicated: Secondary | ICD-10-CM | POA: Diagnosis not present

## 2018-05-03 DIAGNOSIS — Z794 Long term (current) use of insulin: Secondary | ICD-10-CM | POA: Insufficient documentation

## 2018-05-03 DIAGNOSIS — K921 Melena: Secondary | ICD-10-CM | POA: Diagnosis present

## 2018-05-03 DIAGNOSIS — I10 Essential (primary) hypertension: Secondary | ICD-10-CM | POA: Diagnosis not present

## 2018-05-03 DIAGNOSIS — K625 Hemorrhage of anus and rectum: Secondary | ICD-10-CM | POA: Insufficient documentation

## 2018-05-03 DIAGNOSIS — F319 Bipolar disorder, unspecified: Secondary | ICD-10-CM | POA: Insufficient documentation

## 2018-05-03 HISTORY — PX: COLONOSCOPY WITH PROPOFOL: SHX5780

## 2018-05-03 LAB — GLUCOSE, CAPILLARY: Glucose-Capillary: 122 mg/dL — ABNORMAL HIGH (ref 70–99)

## 2018-05-03 SURGERY — COLONOSCOPY WITH PROPOFOL
Anesthesia: General

## 2018-05-03 MED ORDER — MIDAZOLAM HCL 2 MG/2ML IJ SOLN
INTRAMUSCULAR | Status: AC
  Filled 2018-05-03: qty 2

## 2018-05-03 MED ORDER — MIDAZOLAM HCL 2 MG/2ML IJ SOLN
INTRAMUSCULAR | Status: DC | PRN
  Administered 2018-05-03: 2 mg via INTRAVENOUS

## 2018-05-03 MED ORDER — PROPOFOL 500 MG/50ML IV EMUL
INTRAVENOUS | Status: DC | PRN
  Administered 2018-05-03: 150 ug/kg/min via INTRAVENOUS

## 2018-05-03 MED ORDER — PROPOFOL 10 MG/ML IV BOLUS
INTRAVENOUS | Status: DC | PRN
  Administered 2018-05-03: 80 mg via INTRAVENOUS

## 2018-05-03 MED ORDER — SODIUM CHLORIDE 0.9 % IV SOLN
INTRAVENOUS | Status: DC
  Administered 2018-05-03: 1000 mL via INTRAVENOUS

## 2018-05-03 NOTE — Anesthesia Procedure Notes (Signed)
Performed by: Noni Stonesifer, CRNA Pre-anesthesia Checklist: Patient identified, Emergency Drugs available, Suction available, Patient being monitored and Timeout performed Patient Re-evaluated:Patient Re-evaluated prior to induction Oxygen Delivery Method: Nasal cannula Induction Type: IV induction       

## 2018-05-03 NOTE — Anesthesia Preprocedure Evaluation (Signed)
Anesthesia Evaluation  Patient identified by MRN, date of birth, ID band Patient awake    Reviewed: Allergy & Precautions, NPO status , Patient's Chart, lab work & pertinent test results  History of Anesthesia Complications Negative for: history of anesthetic complications  Airway Mallampati: II       Dental   Pulmonary neg sleep apnea, neg COPD, Current Smoker,           Cardiovascular hypertension, Pt. on medications (-) Past MI and (-) CHF (-) dysrhythmias (-) Valvular Problems/Murmurs     Neuro/Psych neg Seizures Depression Bipolar Disorder Schizophrenia    GI/Hepatic Neg liver ROS, neg GERD  ,  Endo/Other  diabetes, Type 2, Oral Hypoglycemic Agents, Insulin Dependent  Renal/GU negative Renal ROS     Musculoskeletal   Abdominal   Peds  Hematology   Anesthesia Other Findings   Reproductive/Obstetrics                             Anesthesia Physical Anesthesia Plan  ASA: III  Anesthesia Plan: General   Post-op Pain Management:    Induction: Intravenous  PONV Risk Score and Plan: 1 and Propofol infusion and TIVA  Airway Management Planned: Nasal Cannula  Additional Equipment:   Intra-op Plan:   Post-operative Plan:   Informed Consent: I have reviewed the patients History and Physical, chart, labs and discussed the procedure including the risks, benefits and alternatives for the proposed anesthesia with the patient or authorized representative who has indicated his/her understanding and acceptance.     Plan Discussed with:   Anesthesia Plan Comments:         Anesthesia Quick Evaluation

## 2018-05-03 NOTE — Op Note (Signed)
[facility] Gastroenterology Patient Name: Lance Vaughan Procedure Date: 05/03/2018 9:54 AM MRN: [id number] Account #: [id number] Date of Birth: Jan 02, 1966 Admit Type: Outpatient Age: 52 Room: [facility] ENDO ROOM 4 Gender: Male Note Status: Finalized Procedure:            Colonoscopy Indications:          Rectal bleeding Providers:            Lin Landsman MD, MD Referring MD:         Vickki Muff. Chrismon, MD (Referring MD), Guadalupe Maple, MD (Referring MD) Medicines:            Monitored Anesthesia Care Complications:        No immediate complications. Estimated blood loss: None. Procedure:            Pre-Anesthesia Assessment:                       - Prior to the procedure, a History and Physical was                        performed, and patient medications and allergies were                        reviewed. The patient is competent. The risks and                        benefits of the procedure and the sedation options and                        risks were discussed with the patient. All questions                        were answered and informed consent was obtained.                        Patient identification and proposed procedure were                        verified by the physician, the nurse, the                        anesthesiologist, the anesthetist and the technician in                        the pre-procedure area in the procedure room in the                        endoscopy suite. Mental Status Examination: alert and                        oriented. Airway Examination: normal oropharyngeal                        airway and neck mobility. Respiratory Examination:                        clear to auscultation. CV Examination: normal.  Prophylactic Antibiotics: The patient does not require                        prophylactic antibiotics. Prior Anticoagulants: The                        patient has taken no  previous anticoagulant or                        antiplatelet agents. ASA Grade Assessment: III - A                        patient with severe systemic disease. After reviewing                        the risks and benefits, the patient was deemed in                        satisfactory condition to undergo the procedure. The                        anesthesia plan was to use monitored anesthesia care                        (MAC). Immediately prior to administration of                        medications, the patient was re-assessed for adequacy                        to receive sedatives. The heart rate, respiratory rate,                        oxygen saturations, blood pressure, adequacy of                        pulmonary ventilation, and response to care were                        monitored throughout the procedure. The physical status                        of the patient was re-assessed after the procedure.                       After obtaining informed consent, the colonoscope was                        passed under direct vision. Throughout the procedure,                        the patient's blood pressure, pulse, and oxygen                        saturations were monitored continuously. The                        Colonoscope was introduced through the anus with the  intention of advancing to the cecum. The scope was                        advanced to the rectum before the procedure was aborted                        due to poor prep. Medications were given. The                        colonoscopy was extremely difficult due to poor bowel                        prep with stool present. Successful completion of the                        procedure was aided by procedure aborted. The patient                        tolerated the procedure well. The quality of the bowel                        preparation was poor. Findings:      The perianal and digital rectal  examinations were normal. Pertinent       negatives include normal sphincter tone and no palpable rectal lesions.      Semi-solid solid stool was found in the rectum and in the recto-sigmoid       colon, interfering with visualization. Impression:           - Preparation of the colon was poor.                       - Stool in the rectum and in the recto-sigmoid colon.                       - No specimens collected. Recommendation:       - Discharge patient to home (with escort).                       - Clear liquid diet today.                       - Continue present medications.                       - Repeat colonoscopy tomorrow if patient agreeable                        otherwise in next 3 months because the bowel                        preparation was poor. Procedure Code(s):    --- Professional ---                       [phone], 53, Colonoscopy, flexible; diagnostic, including                        collection of specimen(s) by brushing or washing, when  performed (separate procedure) Diagnosis Code(s):    --- Professional ---                       K62.5, Hemorrhage of anus and rectum CPT copyright 2018 American Medical Association. All rights reserved. The codes documented in this report are preliminary and upon coder review may  be revised to meet current compliance requirements. Dr. Ulyess Mort Lin Landsman MD, MD 05/03/2018 10:03:09 AM This report has been signed electronically. Number of Addenda: 0 Note Initiated On: 05/03/2018 9:54 AM Total Procedure Duration: 0 hours 1 minute 5 seconds       [facility]

## 2018-05-03 NOTE — Transfer of Care (Signed)
Immediate Anesthesia Transfer of Care Note  Patient: Lance Vaughan  Procedure(s) Performed: COLONOSCOPY WITH PROPOFOL (N/A )  Patient Location: PACU  Anesthesia Type:General  Level of Consciousness: drowsy  Airway & Oxygen Therapy: Patient Spontanous Breathing and Patient connected to nasal cannula oxygen  Post-op Assessment: Report given to RN and Post -op Vital signs reviewed and stable  Post vital signs: stable  Last Vitals:  Vitals Value Taken Time  BP    Temp    Pulse 100 05/03/2018 10:06 AM  Resp 21 05/03/2018 10:06 AM  SpO2 100 % 05/03/2018 10:06 AM  Vitals shown include unvalidated device data.  Last Pain:  Vitals:   05/03/18 0902  TempSrc: Tympanic  PainSc: 0-No pain         Complications: No apparent anesthesia complications

## 2018-05-03 NOTE — Anesthesia Post-op Follow-up Note (Signed)
Anesthesia QCDR form completed.        

## 2018-05-03 NOTE — Anesthesia Postprocedure Evaluation (Signed)
Anesthesia Post Note  Patient: Lance Vaughan  Procedure(s) Performed: COLONOSCOPY WITH PROPOFOL (N/A )  Patient location during evaluation: Endoscopy Anesthesia Type: General Level of consciousness: awake and alert Pain management: pain level controlled Vital Signs Assessment: post-procedure vital signs reviewed and stable Respiratory status: spontaneous breathing and respiratory function stable Cardiovascular status: stable Anesthetic complications: no     Last Vitals:  Vitals:   05/03/18 0902 05/03/18 1005  BP: (!) 140/103 132/83  Pulse: 86   Resp: 20   Temp: (!) 35.9 C (!) 36.1 C  SpO2: 98%     Last Pain:  Vitals:   05/03/18 1005  TempSrc: Tympanic  PainSc:                  KEPHART,WILLIAM K

## 2018-05-03 NOTE — H&P (Signed)
Arlyss Repress, MD [address]  [address]  [place], [place] [address]  Main: [phone]  Fax: [phone] Pager: [phone]  Primary Care Physician:  Steele Sizer, MD Primary Gastroenterologist:  Dr. Arlyss Repress  Pre-Procedure History & Physical: HPI:  Lance Vaughan is a 52 y.o. male is here for an colonoscopy.   Past Medical History:  Diagnosis Date  . Arthritis   . Bipolar disorder (HCC)   . Bursitis   . Depression   . Diabetes mellitus without complication (HCC)   . Diverticulitis   . Hyperlipidemia   . Hypertension   . Psychosis (HCC)   . Psychosis (HCC)     History reviewed. No pertinent surgical history.  Prior to Admission medications   Medication Sig Start Date End Date Taking? Authorizing Provider  amLODipine (NORVASC) 10 MG tablet Take 1 tablet (10 mg total) by mouth daily. 04/15/18  Yes Particia Nearing, PA-C  aspirin EC 81 MG tablet Take 1 tablet (81 mg total) by mouth daily. 02/25/18  Yes Tukov-Yual, Alroy Bailiff, NP  atorvastatin (LIPITOR) 40 MG tablet Take 1 tablet (40 mg total) by mouth daily. 04/15/18  Yes Particia Nearing, PA-C  dapagliflozin propanediol (FARXIGA) 5 MG TABS tablet Take 5 mg by mouth daily. 04/15/18  Yes Particia Nearing, PA-C  fenofibrate 54 MG tablet Take 1 tablet (54 mg total) by mouth daily. 04/15/18  Yes Particia Nearing, PA-C  gabapentin (NEURONTIN) 100 MG capsule Take 1 capsule (100 mg total) by mouth 3 (three) times daily. 02/25/18  Yes Tukov-Yual, Magdalene S, NP  glucose blood test strip Use as instructed 02/25/18  Yes Tukov-Yual, Magdalene S, NP  insulin detemir (LEVEMIR) 100 unit/ml SOLN Inject 0.65 mLs (65 Units total) into the skin at bedtime. 02/25/18  Yes Tukov-Yual, Alroy Bailiff, NP  insulin glulisine (APIDRA) 100 UNIT/ML injection Inject 0.08 mLs (8 Units total) into the skin 2 (two) times daily after a meal. Before dinner Patient taking differently: Inject 10 Units into the skin 2 (two) times  daily after a meal. Before dinner 02/25/18  Yes Tukov-Yual, Magdalene S, NP  Insulin Pen Needle (PEN NEEDLES) 32G X 4 MM MISC 1 Units by Does not apply route 3 (three) times daily as needed. 04/15/18  Yes Particia Nearing, PA-C  metFORMIN (GLUCOPHAGE) 1000 MG tablet Take 1 tablet (1,000 mg total) by mouth 2 (two) times daily with a meal. 02/25/18  Yes Tukov-Yual, Magdalene S, NP  naproxen (NAPROSYN) 500 MG tablet Take 1 tablet (500 mg total) by mouth 2 (two) times daily with a meal. 03/18/18  Yes Particia Nearing, PA-C  sertraline (ZOLOFT) 100 MG tablet Take 1 tablet (100 mg total) by mouth daily. 02/25/18  Yes Tukov-Yual, Alroy Bailiff, NP  sildenafil (VIAGRA) 100 MG tablet Take 0.5-1 tablets (50-100 mg total) by mouth daily as needed for erectile dysfunction. 04/15/18  Yes Particia Nearing, PA-C    Allergies as of 04/01/2018 - Review Complete 04/01/2018  Allergen Reaction Noted  . Lisinopril Other (See Comments) 05/02/2017    Family History  Problem Relation Age of Onset  . Hypertension Mother   . Hypertension Sister   . Hyperlipidemia Sister   . Hyperlipidemia Sister   . Hypertension Sister   . Diabetes Maternal Grandmother     Social History   Socioeconomic History  . Marital status: Single    Spouse name: Not on file  . Number of children: 4  . Years of education: Not on file  .  Highest education level: GED or equivalent  Occupational History  . Occupation: disability  Social Needs  . Financial resource strain: Very hard  . Food insecurity:    Worry: Often true    Inability: Sometimes true  . Transportation needs:    Medical: Yes    Non-medical: No  Tobacco Use  . Smoking status: Current Every Day Smoker    Packs/day: 0.50    Years: 30.00    Pack years: 15.00    Types: Cigarettes  . Smokeless tobacco: Never Used  Substance and Sexual Activity  . Alcohol use: No    Frequency: Never  . Drug use: No  . Sexual activity: Yes  Lifestyle  . Physical  activity:    Days per week: 3 days    Minutes per session: 10 min  . Stress: Rather much  Relationships  . Social connections:    Talks on phone: Once a week    Gets together: Never    Attends religious service: Never    Active member of club or organization: No    Attends meetings of clubs or organizations: Never    Relationship status: Separated  . Intimate partner violence:    Fear of current or ex partner: No    Emotionally abused: No    Physically abused: No    Forced sexual activity: No  Other Topics Concern  . Not on file  Social History Narrative   Patient is currently living with his ex-wife, but is looking for other housing options.  He gets food when he has a ride to the foodbank.    Review of Systems: See HPI, otherwise negative ROS  Physical Exam: BP (!) 140/103   Pulse 86   Temp (!) 96.6 F (35.9 C) (Tympanic)   Resp 20   Ht 6' (1.829 m)   Wt 119.3 kg   SpO2 98%   BMI 35.67 kg/m  General:   Alert,  pleasant and cooperative in NAD Head:  Normocephalic and atraumatic. Neck:  Supple; no masses or thyromegaly. Lungs:  Clear throughout to auscultation.    Heart:  Regular rate and rhythm. Abdomen:  Soft, nontender and nondistended. Normal bowel sounds, without guarding, and without rebound.   Neurologic:  Alert and  oriented x4;  grossly normal neurologically.  Impression/Plan: Lance Vaughan is here for an colonoscopy to be performed for rectal bleeding  Risks, benefits, limitations, and alternatives regarding  colonoscopy have been reviewed with the patient.  Questions have been answered.  All parties agreeable.   Lannette Donath, MD  05/03/2018, 9:08 AM

## 2018-05-04 ENCOUNTER — Encounter: Payer: Self-pay | Admitting: Gastroenterology

## 2018-05-06 ENCOUNTER — Telehealth: Payer: Self-pay | Admitting: Pharmacist

## 2018-05-06 NOTE — Telephone Encounter (Signed)
05/06/2018 8:24:54 AM - Lantus Solostar refill  05/06/18 Sending refill request to provider for Lantus Solostar Inject 65 units under the skin every morning # 4.Forde RadonAJ

## 2018-05-07 ENCOUNTER — Ambulatory Visit: Payer: Medicaid Other | Admitting: Podiatry

## 2018-05-13 ENCOUNTER — Ambulatory Visit: Payer: Medicaid Other | Admitting: Podiatry

## 2018-05-13 ENCOUNTER — Telehealth: Payer: Self-pay | Admitting: Pharmacist

## 2018-05-13 NOTE — Telephone Encounter (Signed)
05/13/2018 10:38:32 AM - Lantus Solostar refill  05/13/18 Faxed [employer] refill request for Lantus Solostar Inject 65 units under the skin every morning #4.Forde RadonAJ

## 2018-05-17 ENCOUNTER — Ambulatory Visit: Payer: Medicaid Other | Admitting: Podiatry

## 2018-07-01 ENCOUNTER — Telehealth: Payer: Self-pay | Admitting: Pharmacy Technician

## 2018-07-01 NOTE — Telephone Encounter (Signed)
Patient has Medicaid.  No longer meets [facility]'s eligibility criteria.  Marlen Koman J. Lyon Dumont Care Manager [facility] 

## 2018-07-21 ENCOUNTER — Ambulatory Visit: Payer: Self-pay | Admitting: Family Medicine

## 2018-07-26 ENCOUNTER — Encounter: Payer: Self-pay | Admitting: Family Medicine

## 2018-07-26 ENCOUNTER — Other Ambulatory Visit: Payer: Self-pay

## 2018-07-26 ENCOUNTER — Ambulatory Visit: Payer: Medicaid Other | Admitting: Family Medicine

## 2018-07-26 VITALS — BP 134/88 | HR 104 | Temp 98.8°F | Ht 72.0 in | Wt 267.2 lb

## 2018-07-26 DIAGNOSIS — E782 Mixed hyperlipidemia: Secondary | ICD-10-CM

## 2018-07-26 DIAGNOSIS — Z794 Long term (current) use of insulin: Secondary | ICD-10-CM

## 2018-07-26 DIAGNOSIS — E1140 Type 2 diabetes mellitus with diabetic neuropathy, unspecified: Secondary | ICD-10-CM

## 2018-07-26 DIAGNOSIS — I10 Essential (primary) hypertension: Secondary | ICD-10-CM | POA: Diagnosis not present

## 2018-07-26 MED ORDER — METOPROLOL SUCCINATE ER 25 MG PO TB24: 25.0000 mg | ORAL_TABLET | Freq: Every day | ORAL | 0 refills | Status: DC

## 2018-07-26 NOTE — Progress Notes (Signed)
BP 134/88 (BP Location: Right Arm, Patient Position: Sitting, Cuff Size: Large)   Pulse (!) 104   Temp 98.8 F (37.1 C)   Ht 6' (1.829 m)   Wt 267 lb 3 oz (121.2 kg)   SpO2 99%   BMI 36.24 kg/m    Subjective:    Patient ID: Lance Vaughan, male    DOB: May 14, 1966, 53 y.o.   MRN: [id number]  HPI: Lance Vaughan is a 53 y.o. male  Chief Complaint  Patient presents with  . Diabetes   Here today for 3 month f/u.   HTN - home BPs have been running 150s-160s/80s-90s on average. Taking medicines faithfully without side effects. Concerned about his HR, which seems to run between 88- 105 typically. Denies CP, SOB, dizziness, syncope. Has been trying to reduce sodium in diet.   DM - BSs running 120s-150s, occasionally 250s if he eats something he's not supposed to. Taking 65 - 67 units nightly of levemir, and usually 8-11 units apidra 2 times daily. Skips the apidra when BSs less than 120. No low blood sugar spells. Taking metformin and farxiga as well.   Cholesterol medicines doing well. Doing better on diet but still not exercising. Denies myalgias, claudication.   Past Medical History:  Diagnosis Date  . Arthritis   . Bipolar disorder (HCC)   . Bursitis   . Depression   . Diabetes mellitus without complication (HCC)   . Diverticulitis   . Hyperlipidemia   . Hypertension   . Psychosis (HCC)   . Psychosis (HCC)    Social History   Socioeconomic History  . Marital status: Single    Spouse name: Not on file  . Number of children: 4  . Years of education: Not on file  . Highest education level: GED or equivalent  Occupational History  . Occupation: disability  Social Needs  . Financial resource strain: Very hard  . Food insecurity:    Worry: Often true    Inability: Sometimes true  . Transportation needs:    Medical: Yes    Non-medical: No  Tobacco Use  . Smoking status: Current Every Day Smoker    Packs/day: 0.50    Years: 30.00    Pack years: 15.00    Types:  Cigarettes  . Smokeless tobacco: Never Used  Substance and Sexual Activity  . Alcohol use: No    Frequency: Never  . Drug use: No  . Sexual activity: Yes  Lifestyle  . Physical activity:    Days per week: 3 days    Minutes per session: 10 min  . Stress: Rather much  Relationships  . Social connections:    Talks on phone: Once a week    Gets together: Never    Attends religious service: Never    Active member of club or organization: No    Attends meetings of clubs or organizations: Never    Relationship status: Separated  . Intimate partner violence:    Fear of current or ex partner: No    Emotionally abused: No    Physically abused: No    Forced sexual activity: No  Other Topics Concern  . Not on file  Social History Narrative   Patient is currently living with his ex-wife, but is looking for other housing options.  He gets food when he has a ride to the foodbank.   Relevant past medical, surgical, family and social history reviewed and updated as indicated. Interim medical history since our last visit reviewed.  Allergies and medications reviewed and updated.  Review of Systems  Per HPI unless specifically indicated above     Objective:    BP 134/88 (BP Location: Right Arm, Patient Position: Sitting, Cuff Size: Large)   Pulse (!) 104   Temp 98.8 F (37.1 C)   Ht 6' (1.829 m)   Wt 267 lb 3 oz (121.2 kg)   SpO2 99%   BMI 36.24 kg/m   Wt Readings from Last 3 Encounters:  07/26/18 267 lb 3 oz (121.2 kg)  05/03/18 263 lb (119.3 kg)  04/15/18 270 lb (122.5 kg)    Physical Exam Vitals signs and nursing note reviewed.  Constitutional:      Appearance: Normal appearance.  HENT:     Head: Atraumatic.  Eyes:     Extraocular Movements: Extraocular movements intact.     Conjunctiva/sclera: Conjunctivae normal.  Neck:     Musculoskeletal: Normal range of motion and neck supple.  Cardiovascular:     Rate and Rhythm: Normal rate and regular rhythm.     Heart  sounds: Normal heart sounds.  Pulmonary:     Effort: Pulmonary effort is normal.     Breath sounds: Normal breath sounds. No wheezing or rales.  Musculoskeletal: Normal range of motion.  Skin:    General: Skin is warm and dry.  Neurological:     General: No focal deficit present.     Mental Status: He is oriented to person, place, and time.  Psychiatric:        Mood and Affect: Mood normal.        Thought Content: Thought content normal.        Judgment: Judgment normal.     Results for orders placed or performed during the hospital encounter of 05/03/18  Glucose, capillary  Result Value Ref Range   Glucose-Capillary 122 (H) 70 - 99 mg/dL      Assessment & Plan:   Problem List Items Addressed This Visit      Cardiovascular and Mediastinum   Hypertension - Primary    BP normal today in clinic but per patient has been above normal consistently at home. Will add metoprolol XL 25 mg and monitor closely. Call with persistent abnormal readings. Hoping this will lower typical HR as well as this is a concern he has      Relevant Medications   metoprolol succinate (TOPROL-XL) 25 MG 24 hr tablet   Other Relevant Orders   Comprehensive metabolic panel     Endocrine   Type 2 diabetes mellitus with diabetic neuropathy, unspecified (HCC)    Recheck A1C, adjust as needed. Continue working on diet and exercise      Relevant Orders   HgB A1c     Other   Hyperlipidemia    Recheck lipids and adjust as needed. COntinue working on lifestyle modifications      Relevant Medications   metoprolol succinate (TOPROL-XL) 25 MG 24 hr tablet   Other Relevant Orders   Lipid Panel w/o Chol/HDL Ratio       Follow up plan: Return in about 3 months (around 10/24/2018) for DM, BP.

## 2018-07-26 NOTE — Assessment & Plan Note (Signed)
BP normal today in clinic but per patient has been above normal consistently at home. Will add metoprolol XL 25 mg and monitor closely. Call with persistent abnormal readings. Hoping this will lower typical HR as well as this is a concern he has

## 2018-07-26 NOTE — Assessment & Plan Note (Signed)
Recheck lipids and adjust as needed. COntinue working on lifestyle modifications

## 2018-07-26 NOTE — Assessment & Plan Note (Signed)
Recheck A1C, adjust as needed. Continue working on diet and exercise 

## 2018-07-27 LAB — COMPREHENSIVE METABOLIC PANEL
ALBUMIN: 4 g/dL (ref 3.8–4.9)
ALT: 44 IU/L (ref 0–44)
AST: 28 IU/L (ref 0–40)
Albumin/Globulin Ratio: 2 (ref 1.2–2.2)
Alkaline Phosphatase: 69 IU/L (ref 39–117)
BUN / CREAT RATIO: 7 — AB (ref 9–20)
BUN: 9 mg/dL (ref 6–24)
CO2: 21 mmol/L (ref 20–29)
CREATININE: 1.37 mg/dL — AB (ref 0.76–1.27)
Calcium: 8.8 mg/dL (ref 8.7–10.2)
Chloride: 107 mmol/L — ABNORMAL HIGH (ref 96–106)
GFR calc non Af Amer: 58 mL/min/{1.73_m2} — ABNORMAL LOW (ref >59)
GFR, EST AFRICAN AMERICAN: 68 mL/min/{1.73_m2} (ref >59)
GLUCOSE: 233 mg/dL — AB (ref 65–99)
Globulin, Total: 2 g/dL (ref 1.5–4.5)
Potassium: 4.3 mmol/L (ref 3.5–5.2)
Sodium: 142 mmol/L (ref 134–144)
TOTAL PROTEIN: 6 g/dL (ref 6.0–8.5)

## 2018-07-27 LAB — LIPID PANEL W/O CHOL/HDL RATIO
Cholesterol, Total: 106 mg/dL (ref 100–199)
HDL: 47 mg/dL (ref >39)
LDL CALC: 24 mg/dL (ref 0–99)
Triglycerides: 176 mg/dL — ABNORMAL HIGH (ref 0–149)
VLDL CHOLESTEROL CAL: 35 mg/dL (ref 5–40)

## 2018-07-27 LAB — HEMOGLOBIN A1C
ESTIMATED AVERAGE GLUCOSE: 166 mg/dL
Hgb A1c MFr Bld: 7.4 % — ABNORMAL HIGH (ref 4.8–5.6)

## 2018-11-19 ENCOUNTER — Ambulatory Visit: Payer: Self-pay

## 2018-11-19 NOTE — Telephone Encounter (Signed)
Agree, with going to ER

## 2018-11-19 NOTE — Telephone Encounter (Signed)
Pt called stating that he has been passing blood with and without stool.  He states he is passing large blood clots.  He takes ASA He states that he had this problem 9 months ago and was suppose to have a colonoscopy but was unable to do the prep and did not have it done. He states that he has abdominal pain and feels nauseated at times. He had diarrhea and constipation intermittently. Per protocol pt will go to ER for evaluation of symptoms. Care advice read to patient. Patient verbalized understanding of all instructions.  Reason for Disposition . [1] MODERATE rectal bleeding (small blood clots, passing blood without stool, or toilet water turns red) AND [2] more than once a day  Answer Assessment - Initial Assessment Questions 1. APPEARANCE of BLOOD: "What color is it?" "Is it passed separately, on the surface of the stool, or mixed in with the stool?"      Bright red 2. AMOUNT: "How much blood was passed?"      A lot with clots 3. FREQUENCY: "How many times has blood been passed with the stools?"     Ones per day for 7 days 4. ONSET: "When was the blood first seen in the stools?" (Days or weeks)      7 days ago 5. DIARRHEA: "Is there also some diarrhea?" If so, ask: "How many diarrhea stools were passed in past 24 hours?"     Some days 6. CONSTIPATION: "Do you have constipation?" If so, "How bad is it?"     sometimes 7. RECURRENT SYMPTOMS: "Have you had blood in your stools before?" If so, ask: "When was the last time?" and "What happened that time?"     9 months ago appointment for colonoscopy 8. BLOOD THINNERS: "Do you take any blood thinners?" (e.g., Coumadin/warfarin, Pradaxa/dabigatran, aspirin)     Asa 9. OTHER SYMPTOMS: "Do you have any other symptoms?"  (e.g., abdominal pain, vomiting, dizziness, fever)     Abdominal pain, nausea 10. PREGNANCY: "Is there any chance you are pregnant?" "When was your last menstrual period?"       N/A  Protocols used: RECTAL BLEEDING-A-AH

## 2018-11-24 ENCOUNTER — Other Ambulatory Visit: Payer: Self-pay

## 2018-11-24 ENCOUNTER — Other Ambulatory Visit: Payer: Self-pay | Admitting: Family Medicine

## 2018-11-24 ENCOUNTER — Encounter: Payer: Self-pay | Admitting: Family Medicine

## 2018-11-24 ENCOUNTER — Ambulatory Visit: Payer: Medicaid Other | Admitting: Family Medicine

## 2018-11-24 VITALS — BP 132/91 | HR 84 | Temp 98.2°F | Ht 72.0 in | Wt 263.0 lb

## 2018-11-24 DIAGNOSIS — R1030 Lower abdominal pain, unspecified: Secondary | ICD-10-CM | POA: Diagnosis not present

## 2018-11-24 DIAGNOSIS — I10 Essential (primary) hypertension: Secondary | ICD-10-CM

## 2018-11-24 DIAGNOSIS — Z794 Long term (current) use of insulin: Secondary | ICD-10-CM

## 2018-11-24 DIAGNOSIS — E1140 Type 2 diabetes mellitus with diabetic neuropathy, unspecified: Secondary | ICD-10-CM | POA: Diagnosis not present

## 2018-11-24 DIAGNOSIS — K921 Melena: Secondary | ICD-10-CM

## 2018-11-24 LAB — CBC WITH DIFFERENTIAL/PLATELET
Hematocrit: 48.6 % (ref 37.5–51.0)
Hemoglobin: 16.2 g/dL (ref 13.0–17.7)
Lymphocytes Absolute: 2.4 10*3/uL (ref 0.7–3.1)
Lymphs: 43 %
MCH: 31.5 pg (ref 26.6–33.0)
MCHC: 33.3 g/dL (ref 31.5–35.7)
MCV: 95 fL (ref 79–97)
MID (Absolute): 0.9 10*3/uL (ref 0.1–1.6)
MID: 15 %
Neutrophils Absolute: 2.4 10*3/uL (ref 1.4–7.0)
Neutrophils: 42 %
Platelets: 285 10*3/uL (ref 150–450)
RBC: 5.14 x10E6/uL (ref 4.14–5.80)
RDW: 13.9 % (ref 11.6–15.4)
WBC: 5.7 10*3/uL (ref 3.4–10.8)

## 2018-11-24 LAB — UA/M W/RFLX CULTURE, ROUTINE
Bilirubin, UA: NEGATIVE
Glucose, UA: NEGATIVE
Ketones, UA: NEGATIVE
Leukocytes,UA: NEGATIVE
Nitrite, UA: NEGATIVE
Protein,UA: NEGATIVE
RBC, UA: NEGATIVE
Specific Gravity, UA: 1.025 (ref 1.005–1.030)
Urobilinogen, Ur: 0.2 mg/dL (ref 0.2–1.0)
pH, UA: 6 (ref 5.0–7.5)

## 2018-11-24 MED ORDER — CIPROFLOXACIN HCL 500 MG PO TABS: 500.0000 mg | ORAL_TABLET | Freq: Two times a day (BID) | ORAL | 0 refills | Status: DC

## 2018-11-24 MED ORDER — METRONIDAZOLE 500 MG PO TABS: 500.0000 mg | ORAL_TABLET | Freq: Three times a day (TID) | ORAL | 0 refills | Status: DC

## 2018-11-24 MED ORDER — HYDROCORTISONE (PERIANAL) 2.5 % EX CREA: 1.0000 "application " | TOPICAL_CREAM | Freq: Two times a day (BID) | CUTANEOUS | 1 refills | Status: DC

## 2018-11-24 NOTE — Progress Notes (Addendum)
BP (!) 132/91   Pulse 84   Temp 98.2 F (36.8 C) (Oral)   Ht 6' (1.829 m)   Wt 263 lb (119.3 kg)   SpO2 98%   BMI 35.67 kg/m    Subjective:    Patient ID: Lance Vaughan, male    DOB: 1966-04-27, 53 y.o.   MRN: [id number]  HPI: Lance Vaughan is a 53 y.o. male  Chief Complaint  Patient presents with  . Abdominal Pain    x about 2 weeks. pt states that he has stopped taken all of his medications 5 days to see if that helps with the pain. did not help    2 weeks of abdominal pain, bright red blood with clots with bowel movement. Occasionally has some constipation for which stool softeners seem to help. Pain is b/l mid abdomen. Sometimes seems triggered by eating, other times not. Was scheduled for a colonoscopy to evaluate this intermittent ongoing issue back in November but that got cancelled due to poor prep and then has not yet been rescheduled due to COVID 19. No fevers, sick contacts, new foods, vomiting.   Also overdue for chronic condition f/u. Stopped taking his medications 5 days ago thinking one of them may have been contributing to his abdominal issues. . Checks home BSs occasionally, last BS check was 134. Sometimes going down into 80s-90s. Taking apidra 10 units at bedtime alongside levemir 65 units. No low blood sugar spells or side effects.   BPs have been excellent per patient since starting the bblocker, typically 120s/80s and feeling very well. Stopped medicines 5 days ago but plans to restart as it did not affect his abdominal pain. Denies CP, SOB, HAs, dizziness.   Relevant past medical, surgical, family and social history reviewed and updated as indicated. Interim medical history since our last visit reviewed. Allergies and medications reviewed and updated.  Review of Systems  Per HPI unless specifically indicated above     Objective:    BP (!) 132/91   Pulse 84   Temp 98.2 F (36.8 C) (Oral)   Ht 6' (1.829 m)   Wt 263 lb (119.3 kg)   SpO2 98%   BMI 35.67  kg/m   Wt Readings from Last 3 Encounters:  11/24/18 263 lb (119.3 kg)  07/26/18 267 lb 3 oz (121.2 kg)  05/03/18 263 lb (119.3 kg)    Physical Exam Vitals signs and nursing note reviewed.  Constitutional:      Appearance: Normal appearance.  HENT:     Head: Atraumatic.  Eyes:     Extraocular Movements: Extraocular movements intact.     Conjunctiva/sclera: Conjunctivae normal.  Neck:     Musculoskeletal: Normal range of motion and neck supple.  Cardiovascular:     Rate and Rhythm: Normal rate and regular rhythm.  Pulmonary:     Effort: Pulmonary effort is normal.     Breath sounds: Normal breath sounds.  Abdominal:     Palpations: There is no mass.     Tenderness: There is abdominal tenderness (moderate LLQ ttp). There is guarding. There is no right CVA tenderness or left CVA tenderness.  Musculoskeletal: Normal range of motion.  Skin:    General: Skin is warm and dry.  Neurological:     General: No focal deficit present.     Mental Status: He is oriented to person, place, and time.  Psychiatric:        Mood and Affect: Mood normal.  Thought Content: Thought content normal.        Judgment: Judgment normal.    Diabetic Foot Exam - Simple   Simple Foot Form Diabetic Foot exam was performed with the following findings:  Yes 11/24/2018  8:30 AM  Visual Inspection Sensation Testing Intact to touch and monofilament testing bilaterally:  Yes Pulse Check Posterior Tibialis and Dorsalis pulse intact bilaterally:  Yes Comments     Results for orders placed or performed in visit on 11/24/18  UA/M w/rflx Culture, Routine  Result Value Ref Range   Specific Gravity, UA 1.025 1.005 - 1.030   pH, UA 6.0 5.0 - 7.5   Color, UA Yellow Yellow   Appearance Ur Clear Clear   Leukocytes,UA Negative Negative   Protein,UA Negative Negative/Trace   Glucose, UA Negative Negative   Ketones, UA Negative Negative   RBC, UA Negative Negative   Bilirubin, UA Negative Negative    Urobilinogen, Ur 0.2 0.2 - 1.0 mg/dL   Nitrite, UA Negative Negative  CBC With Differential/Platelet  Result Value Ref Range   WBC 5.7 3.4 - 10.8 x10E3/uL   RBC 5.14 4.14 - 5.80 x10E6/uL   Hemoglobin 16.2 13.0 - 17.7 g/dL   Hematocrit 16.148.6 09.637.5 - 51.0 %   MCV 95 79 - 97 fL   MCH 31.5 26.6 - 33.0 pg   MCHC 33.3 31.5 - 35.7 g/dL   RDW 04.513.9 40.911.6 - 81.115.4 %   Platelets 285 150 - 450 x10E3/uL   Neutrophils 42 Not Estab. %   Lymphs 43 Not Estab. %   MID 15 Not Estab. %   Neutrophils Absolute 2.4 1.4 - 7.0 x10E3/uL   Lymphocytes Absolute 2.4 0.7 - 3.1 x10E3/uL   MID (Absolute) 0.9 0.1 - 1.6 X10E3/uL      Assessment & Plan:   Problem List Items Addressed This Visit      Cardiovascular and Mediastinum   Hypertension    Mildly elevated diastolic today, but off medications x 5 days. Restart and call with abnormal readings. Per patient, under good control when on medications. Continue current regimen        Nervous and Auditory   Type 2 diabetes mellitus with diabetic neuropathy, unspecified (HCC) - Primary    Recheck A1C today. Continue working on diet and exercise. Discussed with patient to take his apidra during mealtimes, not alongside levemir at bedtime, and to be consistently checking his sugars prior to using insulin doses.       Relevant Orders   HgB A1c    Other Visit Diagnoses    Bloody stools       Pt to call GI to get evaluation rescheduled and for further consultation on this issue. Strict return precautions given if sxs worsening   Relevant Orders   CBC with Differential/Platelet   CBC With Differential/Platelet (Completed)   Lower abdominal pain       Concern for diverticulitis given pain, bowel changes, and bloody stools. Await CBC, U/A, and other labs and start cipro flagyl. F/u with GI ASAP   Relevant Orders   UA/M w/rflx Culture, Routine (Completed)   Comprehensive metabolic panel   Lipase       Follow up plan: Return in about 3 months (around 02/24/2019) for  DM, HLD.

## 2018-11-25 LAB — COMPREHENSIVE METABOLIC PANEL
ALT: 69 IU/L — ABNORMAL HIGH (ref 0–44)
AST: 39 IU/L (ref 0–40)
Albumin/Globulin Ratio: 2 (ref 1.2–2.2)
Albumin: 4.3 g/dL (ref 3.8–4.9)
Alkaline Phosphatase: 70 IU/L (ref 39–117)
BUN/Creatinine Ratio: 10 (ref 9–20)
BUN: 12 mg/dL (ref 6–24)
Bilirubin Total: 0.3 mg/dL (ref 0.0–1.2)
CO2: 24 mmol/L (ref 20–29)
Calcium: 9.2 mg/dL (ref 8.7–10.2)
Chloride: 103 mmol/L (ref 96–106)
Creatinine, Ser: 1.18 mg/dL (ref 0.76–1.27)
GFR calc Af Amer: 81 mL/min/{1.73_m2} (ref >59)
GFR calc non Af Amer: 70 mL/min/{1.73_m2} (ref >59)
Globulin, Total: 2.2 g/dL (ref 1.5–4.5)
Glucose: 150 mg/dL — ABNORMAL HIGH (ref 65–99)
Potassium: 4.7 mmol/L (ref 3.5–5.2)
Sodium: 143 mmol/L (ref 134–144)
Total Protein: 6.5 g/dL (ref 6.0–8.5)

## 2018-11-25 LAB — LIPASE: Lipase: 44 U/L (ref 13–78)

## 2018-11-25 LAB — HEMOGLOBIN A1C
Est. average glucose Bld gHb Est-mCnc: 151 mg/dL
Hgb A1c MFr Bld: 6.9 % — ABNORMAL HIGH (ref 4.8–5.6)

## 2018-11-29 NOTE — Assessment & Plan Note (Signed)
Recheck A1C today. Continue working on diet and exercise. Discussed with patient to take his apidra during mealtimes, not alongside levemir at bedtime, and to be consistently checking his sugars prior to using insulin doses.

## 2018-11-29 NOTE — Assessment & Plan Note (Signed)
Mildly elevated diastolic today, but off medications x 5 days. Restart and call with abnormal readings. Per patient, under good control when on medications. Continue current regimen

## 2018-12-16 ENCOUNTER — Ambulatory Visit: Payer: Medicaid Other | Admitting: Ophthalmology

## 2018-12-31 ENCOUNTER — Ambulatory Visit: Payer: Medicaid Other | Admitting: Podiatry

## 2018-12-31 ENCOUNTER — Other Ambulatory Visit: Payer: Self-pay

## 2019-01-03 ENCOUNTER — Ambulatory Visit (INDEPENDENT_AMBULATORY_CARE_PROVIDER_SITE_OTHER): Payer: Medicaid Other | Admitting: Podiatry

## 2019-01-03 ENCOUNTER — Encounter: Payer: Self-pay | Admitting: Podiatry

## 2019-01-03 ENCOUNTER — Other Ambulatory Visit: Payer: Self-pay

## 2019-01-03 DIAGNOSIS — E1140 Type 2 diabetes mellitus with diabetic neuropathy, unspecified: Secondary | ICD-10-CM | POA: Diagnosis not present

## 2019-01-03 DIAGNOSIS — M79675 Pain in left toe(s): Secondary | ICD-10-CM | POA: Diagnosis not present

## 2019-01-03 DIAGNOSIS — M79674 Pain in right toe(s): Secondary | ICD-10-CM | POA: Diagnosis not present

## 2019-01-03 DIAGNOSIS — B351 Tinea unguium: Secondary | ICD-10-CM | POA: Diagnosis not present

## 2019-01-03 NOTE — Progress Notes (Signed)
This patient presents to the office with chief complaint of long thick nails and diabetic feet.  This patient  says there  is  no pain and discomfort in his  feet.  This patient says there are long thick painful nails.  These nails are painful walking and wearing shoes.  Patient has no history of infection or drainage from both feet.  Patient is unable to  self treat his own nails . This patient presents  to the office today for treatment of the  long nails and a foot evaluation due to history of  diabetes.  General Appearance  Alert, conversant and in no acute stress.  Vascular  Dorsalis pedis and posterior tibial  pulses are palpable  bilaterally.  Capillary return is within normal limits  bilaterally. Temperature is within normal limits  bilaterally.  Neurologic  Senn-Weinstein monofilament wire test within normal limits  bilaterally. Muscle power within normal limits bilaterally.  Nails Thick disfigured discolored nails with subungual debris  from hallux to fifth toes bilaterally. No evidence of bacterial infection or drainage bilaterally.  Orthopedic  No limitations of motion of motion feet .  No crepitus or effusions noted.  No bony pathology or digital deformities noted.  Skin  normotropic skin with no porokeratosis noted bilaterally.  No signs of infections or ulcers noted.     Onychomycosis  Diabetes with no foot complications  IE  Debride nails x 10.  A diabetic foot exam was performed and there is no evidence of any vascular or neurologic pathology.   RTC 3 months.   Seena Ritacco DPM  

## 2019-01-20 ENCOUNTER — Ambulatory Visit (INDEPENDENT_AMBULATORY_CARE_PROVIDER_SITE_OTHER): Payer: Medicaid Other | Admitting: Family Medicine

## 2019-01-20 ENCOUNTER — Other Ambulatory Visit: Payer: Self-pay

## 2019-01-20 ENCOUNTER — Encounter: Payer: Self-pay | Admitting: Family Medicine

## 2019-01-20 DIAGNOSIS — E1140 Type 2 diabetes mellitus with diabetic neuropathy, unspecified: Secondary | ICD-10-CM | POA: Diagnosis not present

## 2019-01-20 DIAGNOSIS — I10 Essential (primary) hypertension: Secondary | ICD-10-CM | POA: Diagnosis not present

## 2019-01-20 DIAGNOSIS — F3132 Bipolar disorder, current episode depressed, moderate: Secondary | ICD-10-CM

## 2019-01-20 DIAGNOSIS — Z794 Long term (current) use of insulin: Secondary | ICD-10-CM

## 2019-01-20 DIAGNOSIS — M25462 Effusion, left knee: Secondary | ICD-10-CM | POA: Diagnosis not present

## 2019-01-20 DIAGNOSIS — K921 Melena: Secondary | ICD-10-CM

## 2019-01-20 MED ORDER — FARXIGA 5 MG PO TABS: 5.0000 mg | ORAL_TABLET | Freq: Every day | ORAL | 1 refills | Status: DC

## 2019-01-20 MED ORDER — METOPROLOL SUCCINATE ER 25 MG PO TB24: 25.0000 mg | ORAL_TABLET | Freq: Every day | ORAL | 1 refills | Status: DC

## 2019-01-20 MED ORDER — AMLODIPINE BESYLATE 10 MG PO TABS: 10.0000 mg | ORAL_TABLET | Freq: Every day | ORAL | 1 refills | Status: DC

## 2019-01-20 MED ORDER — ARIPIPRAZOLE 5 MG PO TABS: 5.0000 mg | ORAL_TABLET | Freq: Every day | ORAL | 5 refills | Status: DC

## 2019-01-20 MED ORDER — GABAPENTIN 300 MG PO CAPS: 300.0000 mg | ORAL_CAPSULE | Freq: Three times a day (TID) | ORAL | 6 refills | Status: DC

## 2019-01-20 MED ORDER — TRULICITY 0.75 MG/0.5ML ~~LOC~~ SOAJ: 0.7500 mg | SUBCUTANEOUS | 3 refills | Status: DC

## 2019-01-20 MED ORDER — ATORVASTATIN CALCIUM 40 MG PO TABS: 40.0000 mg | ORAL_TABLET | Freq: Every day | ORAL | 1 refills | Status: DC

## 2019-01-20 NOTE — Progress Notes (Signed)
Wt 268 lb (121.6 kg)   BMI 36.35 kg/m    Subjective:    Patient ID: Lance Vaughan, male    DOB: 03-May-1966, 53 y.o.   MRN: [id number]  HPI: Lance Vaughan is a 53 y.o. male  Med check Patient with multiple issues 1.  Today is left knee swelling just started for 5 days again no known trauma irritation does not seem to be red or hot to the touch no past history of gout. Has difficulty walking because of his knee hurts.  Wants gabapentin as that is helped previously for pain. Has difficulty with weight loss wanting something for weight loss. Diabetes apparent good control but with weight gain worsening control. Nerves are getting worse took Zoloft for a little while and that made things worse wife affirms that patient is very irritable and short tempered wants something for his nerves.   Relevant past medical, surgical, family and social history reviewed and updated as indicated. Interim medical history since our last visit reviewed. Allergies and medications reviewed and updated.  Review of Systems  Constitutional: Negative.   Respiratory: Negative.   Cardiovascular: Negative.     Per HPI unless specifically indicated above     Objective:    Wt 268 lb (121.6 kg)   BMI 36.35 kg/m   Wt Readings from Last 3 Encounters:  01/20/19 268 lb (121.6 kg)  11/24/18 263 lb (119.3 kg)  07/26/18 267 lb 3 oz (121.2 kg)    Physical Exam  Results for orders placed or performed in visit on 11/24/18  Comprehensive metabolic panel  Result Value Ref Range   Glucose 150 (H) 65 - 99 mg/dL   BUN 12 6 - 24 mg/dL   Creatinine, Ser 1.18 0.76 - 1.27 mg/dL   GFR calc non Af Amer 70 >59 mL/min/1.73   GFR calc Af Amer 81 >59 mL/min/1.73   BUN/Creatinine Ratio 10 9 - 20   Sodium 143 134 - 144 mmol/L   Potassium 4.7 3.5 - 5.2 mmol/L   Chloride 103 96 - 106 mmol/L   CO2 24 20 - 29 mmol/L   Calcium 9.2 8.7 - 10.2 mg/dL   Total Protein 6.5 6.0 - 8.5 g/dL   Albumin 4.3 3.8 - 4.9 g/dL   Globulin,  Total 2.2 1.5 - 4.5 g/dL   Albumin/Globulin Ratio 2.0 1.2 - 2.2   Bilirubin Total 0.3 0.0 - 1.2 mg/dL   Alkaline Phosphatase 70 39 - 117 IU/L   AST 39 0 - 40 IU/L   ALT 69 (H) 0 - 44 IU/L  Hemoglobin A1c  Result Value Ref Range   Hgb A1c MFr Bld 6.9 (H) 4.8 - 5.6 %   Est. average glucose Bld gHb Est-mCnc 151 mg/dL  Lipase  Result Value Ref Range   Lipase 44 13 - 78 U/L      Assessment & Plan:   Problem List Items Addressed This Visit      Cardiovascular and Mediastinum   Hypertension    The current medical regimen is effective;  continue present plan and medications.       Relevant Medications   amLODipine (NORVASC) 10 MG tablet   atorvastatin (LIPITOR) 40 MG tablet   metoprolol succinate (TOPROL-XL) 25 MG 24 hr tablet     Nervous and Auditory   Type 2 diabetes mellitus with diabetic neuropathy, unspecified ([place])    Discussed weight loss and medication.  Will add Trulicity starting with 0.75 mg once a week.  Discussed self injections patient  willing to do. Discussed weight loss and eating less discussed possibility of needing to adjust insulin downward.      Relevant Medications   atorvastatin (LIPITOR) 40 MG tablet   dapagliflozin propanediol (FARXIGA) 5 MG TABS tablet   Dulaglutide (TRULICITY) 0.75 MG/0.5ML SOPN     Musculoskeletal and Integument   Knee effusion, left    Will refer to orthopedics to further evaluate and consider x-rays      Relevant Orders   Ambulatory referral to Orthopedic Surgery     Other   Bipolar disorder [facility](HCC)    Discussed exacerbation with Zoloft has stopped that and will start Abilify 5 mg      Morbid obesity (HCC)    Discussed weight loss eating less and Trulicity should help      Relevant Medications   dapagliflozin propanediol (FARXIGA) 5 MG TABS tablet   Dulaglutide (TRULICITY) 0.75 MG/0.5ML SOPN   Bloody stool    Patient going back to GI to have further evaluation         Telemedicine using audio/video  telecommunications for a synchronous communication visit. Today's visit due to COVID-19 isolation precautions I connected with and verified that I am speaking with the correct person using two identifiers.   I discussed the limitations, risks, security and privacy concerns of performing an evaluation and management service by telecommunication and the availability of in person appointments. I also discussed with the patient that there may be a patient responsible charge related to this service. The patient expressed understanding and agreed to proceed. The patient's location is home. I am at home.   I discussed the assessment and treatment plan with the patient. The patient was provided an opportunity to ask questions and all were answered. The patient agreed with the plan and demonstrated an understanding of the instructions.   The patient was advised to call back or seek an in-person evaluation if the symptoms worsen or if the condition fails to improve as anticipated.   I provided 21+ minutes of time during this encounter. Follow up plan: Return in about 4 weeks (around 02/17/2019) for Recheck knee, gabapentin, starting Abilify, and consider increasing Trulicity to 1.5 mg. Will check at next visit hemoglobin A1c, BMP, lipids, ALT, AST.

## 2019-01-20 NOTE — Assessment & Plan Note (Signed)
Discussed exacerbation with Zoloft has stopped that and will start Abilify 5 mg

## 2019-01-20 NOTE — Assessment & Plan Note (Signed)
The current medical regimen is effective;  continue present plan and medications.  

## 2019-01-20 NOTE — Assessment & Plan Note (Addendum)
Discussed weight loss and medication.  Will add Trulicity starting with 0.75 mg once a week.  Discussed self injections patient willing to do. Discussed weight loss and eating less discussed possibility of needing to adjust insulin downward.

## 2019-01-20 NOTE — Assessment & Plan Note (Signed)
Will refer to orthopedics to further evaluate and consider x-rays

## 2019-01-20 NOTE — Assessment & Plan Note (Signed)
Patient going back to GI to have further evaluation

## 2019-01-20 NOTE — Assessment & Plan Note (Signed)
Discussed weight loss eating less and Trulicity should help

## 2019-01-21 ENCOUNTER — Telehealth: Payer: Self-pay

## 2019-01-21 NOTE — Telephone Encounter (Signed)
PA for Trulicity initiated and submitted via [employer].  Confirmation #:[id number] W

## 2019-02-03 ENCOUNTER — Other Ambulatory Visit: Payer: Self-pay | Admitting: Family Medicine

## 2019-02-03 NOTE — Telephone Encounter (Signed)
Requesting more information.  Patient is only on Levemir and then takes [place].

## 2019-02-03 NOTE — Telephone Encounter (Signed)
PA denied. Routing to provider for alternative.

## 2019-02-03 NOTE — Telephone Encounter (Signed)
Call pt just adjust insul;in as needed

## 2019-02-24 ENCOUNTER — Other Ambulatory Visit: Payer: Self-pay

## 2019-02-24 ENCOUNTER — Encounter: Payer: Self-pay | Admitting: Family Medicine

## 2019-02-24 ENCOUNTER — Ambulatory Visit (INDEPENDENT_AMBULATORY_CARE_PROVIDER_SITE_OTHER): Payer: Medicaid Other | Admitting: Family Medicine

## 2019-02-24 VITALS — BP 136/94 | HR 85 | Temp 96.7°F | Ht 72.0 in | Wt 267.0 lb

## 2019-02-24 DIAGNOSIS — F3132 Bipolar disorder, current episode depressed, moderate: Secondary | ICD-10-CM

## 2019-02-24 DIAGNOSIS — E782 Mixed hyperlipidemia: Secondary | ICD-10-CM

## 2019-02-24 DIAGNOSIS — Z794 Long term (current) use of insulin: Secondary | ICD-10-CM

## 2019-02-24 DIAGNOSIS — E1140 Type 2 diabetes mellitus with diabetic neuropathy, unspecified: Secondary | ICD-10-CM

## 2019-02-24 DIAGNOSIS — I10 Essential (primary) hypertension: Secondary | ICD-10-CM | POA: Diagnosis not present

## 2019-02-24 MED ORDER — OZEMPIC (0.25 OR 0.5 MG/DOSE) 2 MG/1.5ML ~~LOC~~ SOPN: 0.2500 mg | PEN_INJECTOR | SUBCUTANEOUS | 2 refills | Status: DC

## 2019-02-24 MED ORDER — INSULIN DEGLUDEC 100 UNIT/ML ~~LOC~~ SOPN: 65.0000 [IU] | PEN_INJECTOR | Freq: Every day | SUBCUTANEOUS | 3 refills | Status: DC

## 2019-02-24 MED ORDER — GABAPENTIN 600 MG PO TABS: 600.0000 mg | ORAL_TABLET | Freq: Three times a day (TID) | ORAL | 3 refills | Status: DC | PRN

## 2019-02-24 NOTE — Progress Notes (Signed)
BP (!) 136/94 (BP Location: Right Arm, Patient Position: Sitting, Cuff Size: Normal)   Pulse 85   Temp (!) 96.7 F (35.9 C) (Oral)   Ht 6' (1.829 m)   Wt 267 lb (121.1 kg)   BMI 36.21 kg/m    Subjective:    Patient ID: Lance Vaughan, male    DOB: 1966-04-24, 53 y.o.   MRN: [id number]  HPI: Lance Vaughan is a 53 y.o. male  Chief Complaint  Patient presents with  . Hyperlipidemia  . Diabetes    . This visit was completed via telephone due to the restrictions of the COVID-19 pandemic. All issues as above were discussed and addressed. Physical exam was done as above through visual confirmation on telephone. If it was felt that the patient should be evaluated in the office, they were directed there. The patient verbally consented to this visit. . Location of the patient: home . Location of the provider: work . Those involved with this call:  . Provider: Merrie Roof, PA-C . CMA: Lesle Chris, [place] . Front Desk/Registration: Jill Side  . Time spent on call: 25 minutes with patient face to face via video conference. More than 50% of this time was spent in counseling and coordination of care. 10 minutes total spent in review of patient's record and preparation of their chart. I verified patient identity using two factors (patient name and date of birth). Patient consents verbally to being seen via telemedicine visit today.   Patient here today for 3 month f/u chronic conditions.   DM - was unable to afford the trulicity that was added last visit to help with sugar control and weight loss. Currently taking farxiga, levemir 65 units at bedtime, and apidra 10 units in the morning and at night . The past few weeks has only been taking the apidra once daily due to intermittent low blood sugar spells in the mornings. Has not been watching diet carefully or exercising.   Gabapentin helping left knee pain, waiting to see orthopedics. Would like to increase dose in meantime to help further.    HTN - BPs 124/65 when checked at home. Tolerating regimen well, denies CP, SOB, HAs, dizziness.   HLD - On lipitor and fenofibrate, tolerating well without side effects. Denies claudication, myalgias.   Bipolar depression. Started on abilify several months ago which so far seems to be working very well. Moods stable, improvement noted in low lows and agitation. Tolerating well without side effects.   Depression screen [facility] 2/9 07/26/2018 04/15/2018 03/18/2018  Decreased Interest 3 1 3   Down, Depressed, Hopeless 3 1 3   PHQ - 2 Score 6 2 6   Altered sleeping 3 2 3   Tired, decreased energy 3 3 3   Change in appetite 2 3 2   Feeling bad or failure about yourself  3 1 3   Trouble concentrating 0 2 2  Moving slowly or fidgety/restless 3 3 1   Suicidal thoughts 0 0 2  PHQ-9 Score 20 16 22   Difficult doing work/chores Extremely dIfficult - -   GAD 7 : Generalized Anxiety Score 04/15/2018 03/18/2018  Nervous, Anxious, on Edge 3 3  Control/stop worrying 3 3  Worry too much - different things 3 3  Trouble relaxing 3 3  Restless 3 3  Easily annoyed or irritable 3 3  Afraid - awful might happen 3 3  Total GAD 7 Score 21 21     Relevant past medical, surgical, family and social history reviewed and updated as indicated. Interim medical  history since our last visit reviewed. Allergies and medications reviewed and updated.  Review of Systems  Per HPI unless specifically indicated above     Objective:    BP (!) 136/94 (BP Location: Right Arm, Patient Position: Sitting, Cuff Size: Normal)   Pulse 85   Temp (!) 96.7 F (35.9 C) (Oral)   Ht 6' (1.829 m)   Wt 267 lb (121.1 kg)   BMI 36.21 kg/m   Wt Readings from Last 3 Encounters:  02/24/19 267 lb (121.1 kg)  01/20/19 268 lb (121.6 kg)  11/24/18 263 lb (119.3 kg)    Physical Exam  Unable to perform PE due to technical difficulties with video technology during today's visit.   Results for orders placed or performed in visit on 11/24/18   Comprehensive metabolic panel  Result Value Ref Range   Glucose 150 (H) 65 - 99 mg/dL   BUN 12 6 - 24 mg/dL   Creatinine, Ser 1.471.18 0.76 - 1.27 mg/dL   GFR calc non Af Amer 70 >59 mL/min/1.73   GFR calc Af Amer 81 >59 mL/min/1.73   BUN/Creatinine Ratio 10 9 - 20   Sodium 143 134 - 144 mmol/L   Potassium 4.7 3.5 - 5.2 mmol/L   Chloride 103 96 - 106 mmol/L   CO2 24 20 - 29 mmol/L   Calcium 9.2 8.7 - 10.2 mg/dL   Total Protein 6.5 6.0 - 8.5 g/dL   Albumin 4.3 3.8 - 4.9 g/dL   Globulin, Total 2.2 1.5 - 4.5 g/dL   Albumin/Globulin Ratio 2.0 1.2 - 2.2   Bilirubin Total 0.3 0.0 - 1.2 mg/dL   Alkaline Phosphatase 70 39 - 117 IU/L   AST 39 0 - 40 IU/L   ALT 69 (H) 0 - 44 IU/L  Hemoglobin A1c  Result Value Ref Range   Hgb A1c MFr Bld 6.9 (H) 4.8 - 5.6 %   Est. average glucose Bld gHb Est-mCnc 151 mg/dL  Lipase  Result Value Ref Range   Lipase 44 13 - 78 U/L      Assessment & Plan:   Problem List Items Addressed This Visit      Cardiovascular and Mediastinum   Hypertension    Mildly elevated today but typically WNL per home readings. Continue current regimen and working on lifestyle modifications to improve control        Nervous and Auditory   Type 2 diabetes mellitus with diabetic neuropathy, unspecified (HCC) - Primary    Will see if another weekly GLP-1 will be better covered for him to help with BS control and weight concerns. Will also switch to tresiba from levemir given significant variability in BSs throughout day and morning hypoglycemia. May hold apidra when sugars running well, may not need it anymore if able to start on ozempic and tresiba. Continue farxiga. Work hard on diet and exercise      Relevant Medications   Semaglutide,0.25 or 0.5MG /DOS, (OZEMPIC, 0.25 OR 0.5 MG/DOSE,) 2 MG/1.5ML SOPN   insulin degludec (TRESIBA) 100 UNIT/ML SOPN FlexTouch Pen   Other Relevant Orders   HgB A1c     Other   Hyperlipidemia    Recheck lipids, adjust as needed. Continue  current regimen      Relevant Orders   Comprehensive metabolic panel   Lipid Panel w/o Chol/HDL Ratio   Bipolar disorder (HCC)    Significant improvement on abilify. Continue current regimen          Follow up plan: Return in about 3  months (around 05/26/2019) for DM.

## 2019-03-01 ENCOUNTER — Telehealth: Payer: Self-pay

## 2019-03-01 NOTE — Assessment & Plan Note (Signed)
Significant improvement on abilify. Continue current regimen

## 2019-03-01 NOTE — Assessment & Plan Note (Signed)
Mildly elevated today but typically WNL per home readings. Continue current regimen and working on lifestyle modifications to improve control

## 2019-03-01 NOTE — Assessment & Plan Note (Signed)
Recheck lipids, adjust as needed. Continue current regimen 

## 2019-03-01 NOTE — Telephone Encounter (Signed)
PA for Ozempic and [place] both approved via [employer].

## 2019-03-01 NOTE — Assessment & Plan Note (Signed)
Will see if another weekly GLP-1 will be better covered for him to help with BS control and weight concerns. Will also switch to tresiba from levemir given significant variability in BSs throughout day and morning hypoglycemia. May hold apidra when sugars running well, may not need it anymore if able to start on ozempic and tresiba. Continue farxiga. Work hard on diet and exercise

## 2019-03-08 ENCOUNTER — Other Ambulatory Visit: Payer: Medicaid Other

## 2019-03-08 ENCOUNTER — Other Ambulatory Visit: Payer: Self-pay

## 2019-03-08 DIAGNOSIS — E782 Mixed hyperlipidemia: Secondary | ICD-10-CM

## 2019-03-08 DIAGNOSIS — E1140 Type 2 diabetes mellitus with diabetic neuropathy, unspecified: Secondary | ICD-10-CM

## 2019-03-09 ENCOUNTER — Encounter: Payer: Self-pay | Admitting: Family Medicine

## 2019-03-09 LAB — COMPREHENSIVE METABOLIC PANEL
ALT: 36 IU/L (ref 0–44)
AST: 28 IU/L (ref 0–40)
Albumin/Globulin Ratio: 1.8 (ref 1.2–2.2)
Albumin: 3.8 g/dL (ref 3.8–4.9)
Alkaline Phosphatase: 51 IU/L (ref 39–117)
BUN/Creatinine Ratio: 8 — ABNORMAL LOW (ref 9–20)
BUN: 11 mg/dL (ref 6–24)
Bilirubin Total: 0.3 mg/dL (ref 0.0–1.2)
CO2: 18 mmol/L — ABNORMAL LOW (ref 20–29)
Calcium: 8.5 mg/dL — ABNORMAL LOW (ref 8.7–10.2)
Chloride: 106 mmol/L (ref 96–106)
Creatinine, Ser: 1.38 mg/dL — ABNORMAL HIGH (ref 0.76–1.27)
GFR calc Af Amer: 67 mL/min/{1.73_m2} (ref >59)
GFR calc non Af Amer: 58 mL/min/{1.73_m2} — ABNORMAL LOW (ref >59)
Globulin, Total: 2.1 g/dL (ref 1.5–4.5)
Glucose: 251 mg/dL — ABNORMAL HIGH (ref 65–99)
Potassium: 3.9 mmol/L (ref 3.5–5.2)
Sodium: 139 mmol/L (ref 134–144)
Total Protein: 5.9 g/dL — ABNORMAL LOW (ref 6.0–8.5)

## 2019-03-09 LAB — LIPID PANEL W/O CHOL/HDL RATIO
Cholesterol, Total: 82 mg/dL — ABNORMAL LOW (ref 100–199)
HDL: 35 mg/dL — ABNORMAL LOW (ref >39)
LDL Chol Calc (NIH): 17 mg/dL (ref 0–99)
Triglycerides: 190 mg/dL — ABNORMAL HIGH (ref 0–149)
VLDL Cholesterol Cal: 30 mg/dL (ref 5–40)

## 2019-03-09 LAB — HEMOGLOBIN A1C
Est. average glucose Bld gHb Est-mCnc: 137 mg/dL
Hgb A1c MFr Bld: 6.4 % — ABNORMAL HIGH (ref 4.8–5.6)

## 2019-04-07 ENCOUNTER — Ambulatory Visit: Payer: Medicaid Other | Admitting: Podiatry

## 2019-04-20 ENCOUNTER — Telehealth: Payer: Self-pay

## 2019-04-20 NOTE — Telephone Encounter (Signed)
PA for [place] initiated and submitted via [place] [employer].  Confirmation number: [id number] W

## 2019-04-25 ENCOUNTER — Other Ambulatory Visit: Payer: Self-pay

## 2019-04-25 ENCOUNTER — Emergency Department
Admission: EM | Admit: 2019-04-25 | Discharge: 2019-04-25 | Discharge status: Against Medical Advice | Payer: Medicaid Other

## 2019-04-25 NOTE — ED Notes (Signed)
Pt laying down outside.  Pt asked to come in to be triaged.  He stated "I can ask him anything out there.  He cant come in, cant sit, and can barely walk." Offered a wheelchair and he said he cant sit.  Pt told when hes ready to be triaged he can come in.  1st nurse notified.

## 2019-04-25 NOTE — ED Notes (Signed)
Pt currently lying on bench outside with no distress noted; pt instructed that he will need to come into the ED to be triaged and vs and taken; pt refuses to come inside at this time; pt accomp by male who st "come on we ain't waiting, we'll just go home and call the ambulance to bring you back"; explained to pt that just because he calls EMS does not mean he will go to an exam room, explained purpose of triage; pt cont to refuse to come inside; informed pt that when he is ready to be seen to please come inside for triage

## 2019-05-03 ENCOUNTER — Encounter: Payer: Self-pay | Admitting: Family Medicine

## 2019-05-03 ENCOUNTER — Ambulatory Visit (INDEPENDENT_AMBULATORY_CARE_PROVIDER_SITE_OTHER): Payer: Medicaid Other | Admitting: Family Medicine

## 2019-05-03 ENCOUNTER — Other Ambulatory Visit: Payer: Self-pay

## 2019-05-03 VITALS — BP 136/94 | HR 78

## 2019-05-03 DIAGNOSIS — K649 Unspecified hemorrhoids: Secondary | ICD-10-CM

## 2019-05-03 MED ORDER — HYDROCORTISONE (PERIANAL) 2.5 % EX CREA: 1.0000 "application " | TOPICAL_CREAM | Freq: Two times a day (BID) | CUTANEOUS | 1 refills | Status: DC

## 2019-05-03 MED ORDER — METRONIDAZOLE 0.75 % EX CREA: TOPICAL_CREAM | Freq: Two times a day (BID) | CUTANEOUS | 0 refills | Status: DC

## 2019-05-03 NOTE — Progress Notes (Signed)
BP (!) 136/94   Pulse 78    Subjective:    Patient ID: Lance Vaughan, male    DOB: [birth year], 53 y.o.   MRN: [id number]  HPI: Lance Vaughan is a 53 y.o. male  Chief Complaint  Patient presents with  . Pain    pt states he has been having hemorrhoid pain for the past 2 weeks. States he went to the hospital and was kicked out because they did not have a bed for him. States he had some leftover flagyl and cipro left over that he started taking a few days ago and they havee helped    . This visit was completed via telephone due to the restrictions of the COVID-19 pandemic. All issues as above were discussed and addressed. Physical exam was done as above through visual confirmation on telephone. If it was felt that the patient should be evaluated in the office, they were directed there. The patient verbally consented to this visit. . Location of the patient: home . Location of the provider: home . Those involved with this call:  . Provider: Merrie Roof, PA-C . CMA: Lesle Chris, [place] . Front Desk/Registration: Jill Side  . Time spent on call: 15 minutes on the phone discussing health concerns. 5 minutes total spent in review of patient's record and preparation of their chart. I verified patient identity using two factors (patient name and date of birth). Patient consents verbally to being seen via telemedicine visit today.   Painful hemorrhoid flare x 2 weeks now. Has dealt with hemorrhoids intermittently flaring for years now with rectal bleeding, pain, constipation at times but now having severe pain, firm external portions of the hemorrhoids he can feel, and barely able to sit or walk. Tried going to the ER during this flare but could not tolerate all the sitting and waiting so ended up going home before being seen. The past few days started some leftover cipro and flagyl in addition to anusol previously given and finally starting to get some mild relief. Denies fevers, chills, abdominal  pain, N/V. Does have a hx of diverticulitis but states this does not feel similar to that, mostly only having the anal pain and bright red bleeding.   Relevant past medical, surgical, family and social history reviewed and updated as indicated. Interim medical history since our last visit reviewed. Allergies and medications reviewed and updated.  Review of Systems  Per HPI unless specifically indicated above     Objective:    BP (!) 136/94   Pulse 78   Wt Readings from Last 3 Encounters:  05/05/19 253 lb (114.8 kg)  02/24/19 267 lb (121.1 kg)  01/20/19 268 lb (121.6 kg)    Physical Exam  Unable to perform PE today due to technical difficulties connecting via video platform  Results for orders placed or performed in visit on 03/08/19  HgB A1c  Result Value Ref Range   Hgb A1c MFr Bld 6.4 (H) 4.8 - 5.6 %   Est. average glucose Bld gHb Est-mCnc 137 mg/dL  Lipid Panel w/o Chol/HDL Ratio  Result Value Ref Range   Cholesterol, Total 82 (L) 100 - 199 mg/dL   Triglycerides 190 (H) 0 - 149 mg/dL   HDL 35 (L) >39 mg/dL   VLDL Cholesterol Cal 30 5 - 40 mg/dL   LDL Chol Calc (NIH) 17 0 - 99 mg/dL  Comprehensive metabolic panel  Result Value Ref Range   Glucose 251 (H) 65 - 99 mg/dL   BUN  11 6 - 24 mg/dL   Creatinine, Ser 3.76 (H) 0.76 - 1.27 mg/dL   GFR calc non Af Amer 58 (L) >59 mL/min/1.73   GFR calc Af Amer 67 >59 mL/min/1.73   BUN/Creatinine Ratio 8 (L) 9 - 20   Sodium 139 134 - 144 mmol/L   Potassium 3.9 3.5 - 5.2 mmol/L   Chloride 106 96 - 106 mmol/L   CO2 18 (L) 20 - 29 mmol/L   Calcium 8.5 (L) 8.7 - 10.2 mg/dL   Total Protein 5.9 (L) 6.0 - 8.5 g/dL   Albumin 3.8 3.8 - 4.9 g/dL   Globulin, Total 2.1 1.5 - 4.5 g/dL   Albumin/Globulin Ratio 1.8 1.2 - 2.2   Bilirubin Total 0.3 0.0 - 1.2 mg/dL   Alkaline Phosphatase 51 39 - 117 IU/L   AST 28 0 - 40 IU/L   ALT 36 0 - 44 IU/L      Assessment & Plan:   Problem List Items Addressed This Visit    None    Visit  Diagnoses    Hemorrhoids, unspecified hemorrhoid type    -  Primary   Relevant Orders   Ambulatory referral to General Surgery     Refill anusol, sitz baths, tucks wipes, miralax and fiber supplements to keep stool soft and regular. He is insistent that the abx have been what's helped most so will transition to topical flagyl in case any sort of infection in the area of pain given lack of ability to examine area. Suspect his severe spike in pain from a thrombosed hemorrhoid. Will refer to General Surgery for management given frequent flares and now worsened condition. Pt to return for uncontrolled bleeding, fevers, abdominal pain, or other worsening sxs in meantime   Follow up plan: Return for as scheduled.

## 2019-05-05 ENCOUNTER — Encounter: Payer: Self-pay | Admitting: Surgery

## 2019-05-05 ENCOUNTER — Other Ambulatory Visit: Payer: Self-pay

## 2019-05-05 ENCOUNTER — Ambulatory Visit (INDEPENDENT_AMBULATORY_CARE_PROVIDER_SITE_OTHER): Payer: Medicaid Other | Admitting: Surgery

## 2019-05-05 VITALS — BP 143/101 | HR 77 | Temp 97.2°F | Resp 14 | Ht 72.0 in | Wt 253.0 lb

## 2019-05-05 DIAGNOSIS — K601 Chronic anal fissure: Secondary | ICD-10-CM | POA: Diagnosis not present

## 2019-05-05 MED ORDER — BISACODYL 5 MG PO TBEC: DELAYED_RELEASE_TABLET | ORAL | 0 refills | Status: DC

## 2019-05-05 MED ORDER — LIDOCAINE 5 % EX OINT: 1.0000 "application " | TOPICAL_OINTMENT | CUTANEOUS | 1 refills | Status: DC | PRN

## 2019-05-05 MED ORDER — POLYETHYLENE GLYCOL 3350 17 GM/SCOOP PO POWD: ORAL | 0 refills | Status: DC

## 2019-05-05 NOTE — Patient Instructions (Addendum)
We want you to start on a full liquid diet for two days. After that start back on foods gradually. Try to stick to a high fiber diet.  You will mix the full bottle of Miralax in 64 ounces of Gatoraid or other clear liquid and mix well. You will need to drink 1 cup every 20-30 minutes until gone. You will also take 4 Dulcolax tablets. We want you to start this today in order to get you cleaned out.   We will call in Nifedipine ointment to [facility] in [place]. They will call you once this prescription is ready.  They are located at [address] [place], [place] [address] [facility]: [phone]  Follow up here in 3 weeks.      High-Fiber Diet Fiber, also called dietary fiber, is a type of carbohydrate that is found in fruits, vegetables, whole grains, and beans. A high-fiber diet can have many health benefits. Your health care provider may recommend a high-fiber diet to help:  Prevent constipation. Fiber can make your bowel movements more regular.  Lower your cholesterol.  Relieve the following conditions: ? Swelling of veins in the anus (hemorrhoids). ? Swelling and irritation (inflammation) of specific areas of the digestive tract (uncomplicated diverticulosis). ? A problem of the large intestine (colon) that sometimes causes pain and diarrhea (irritable bowel syndrome, IBS).  Prevent overeating as part of a weight-loss plan.  Prevent heart disease, type 2 diabetes, and certain cancers. What is my plan? The recommended daily fiber intake in grams (g) includes:  38 g for men age 82 or younger.  30 g for men over age 7.  25 g for women age 75 or younger.  21 g for women over age 66. You can get the recommended daily intake of dietary fiber by:  Eating a variety of fruits, vegetables, grains, and beans.  Taking a fiber supplement, if it is not possible to get enough fiber through your diet. What do I need to know about a high-fiber diet?  It is better to get fiber through food  sources rather than from fiber supplements. There is not a lot of research about how effective supplements are.  Always check the fiber content on the nutrition facts label of any prepackaged food. Look for foods that contain 5 g of fiber or more per serving.  Talk with a diet and nutrition specialist (dietitian) if you have questions about specific foods that are recommended or not recommended for your medical condition, especially if those foods are not listed below.  Gradually increase how much fiber you consume. If you increase your intake of dietary fiber too quickly, you may have bloating, cramping, or gas.  Drink plenty of water. Water helps you to digest fiber. What are tips for following this plan?  Eat a wide variety of high-fiber foods.  Make sure that half of the grains that you eat each day are whole grains.  Eat breads and cereals that are made with whole-grain flour instead of refined flour or white flour.  Eat brown rice, bulgur wheat, or millet instead of white rice.  Start the day with a breakfast that is high in fiber, such as a cereal that contains 5 g of fiber or more per serving.  Use beans in place of meat in soups, salads, and pasta dishes.  Eat high-fiber snacks, such as berries, raw vegetables, nuts, and popcorn.  Choose whole fruits and vegetables instead of processed forms like juice or sauce. What foods can  I eat?  Fruits Berries. Pears. Apples. Oranges. Avocado. Prunes and raisins. Dried figs. Vegetables Sweet potatoes. Spinach. Kale. Artichokes. Cabbage. Broccoli. Cauliflower. Green peas. Carrots. Squash. Grains Whole-grain breads. Multigrain cereal. Oats and oatmeal. Brown rice. Barley. Bulgur wheat. Alpine. Quinoa. Bran muffins. Popcorn. Rye wafer crackers. Meats and other proteins Navy, kidney, and pinto beans. Soybeans. Split peas. Lentils. Nuts and seeds. Dairy Fiber-fortified yogurt. Beverages Fiber-fortified soy milk. Fiber-fortified orange  juice. Other foods Fiber bars. The items listed above may not be a complete list of recommended foods and beverages. Contact a dietitian for more options. What foods are not recommended? Fruits Fruit juice. Cooked, strained fruit. Vegetables Fried potatoes. Canned vegetables. Well-cooked vegetables. Grains White bread. Pasta made with refined flour. White rice. Meats and other proteins Fatty cuts of meat. Fried chicken or fried fish. Dairy Milk. Yogurt. Cream cheese. Sour cream. Fats and oils Butters. Beverages Soft drinks. Other foods Cakes and pastries. The items listed above may not be a complete list of foods and beverages to avoid. Contact a dietitian for more information. Summary  Fiber is a type of carbohydrate. It is found in fruits, vegetables, whole grains, and beans.  There are many health benefits of eating a high-fiber diet, such as preventing constipation, lowering blood cholesterol, helping with weight loss, and reducing your risk of heart disease, diabetes, and certain cancers.  Gradually increase your intake of fiber. Increasing too fast can result in cramping, bloating, and gas. Drink plenty of water while you increase your fiber.  The best sources of fiber include whole fruits and vegetables, whole grains, nuts, seeds, and beans. This information is not intended to replace advice given to you by your health care provider. Make sure you discuss any questions you have with your health care provider. Document Released: 06/09/2005 Document Revised: 04/13/2017 Document Reviewed: 04/13/2017 Elsevier Patient Education  2020 [employer].

## 2019-05-05 NOTE — Progress Notes (Signed)
Patient ID: Lance Vaughan, male   DOB: 05-09-1966, 53 y.o.   MRN: [id number]  Chief Complaint: Painful hemorrhoids over multiple years History of Present Illness Lance Vaughan is a 53 y.o. male with complaint of the above.  He reports that it is so severe that he avoids having a bowel movement.  Previously he had some blood, bright red blood per rectum associated with movements.  His most recent exacerbation began about 3 weeks ago.  He reports he has a bowel movement daily and constipation is not a problem however it takes a lot of effort, and the pain inhibits his freedom of movement.  He has no history of prior anal procedures, no history of thrombosed hemorrhoids.  He had a colonoscopy about a year ago and he reports feeling at his best shortly after that colonoscopy was completed.  He had been advised to keep increasing his doses of MiraLAX and he does not feel this has been successful.  He has utilized multiple different topical agents including Tucks pads, Preparation H and others.  He does sitz bath's he has burning and itching as well.  Past Medical History Past Medical History:  Diagnosis Date  . Arthritis   . Bipolar disorder ([place])   . Bursitis   . Depression   . Diabetes mellitus without complication ([place])   . Diverticulitis   . Hyperlipidemia   . Hypertension   . Psychosis ([place])   . Psychosis [facility]        Past Surgical History:  Procedure Laterality Date  . COLONOSCOPY WITH PROPOFOL N/A 05/03/2018   Procedure: COLONOSCOPY WITH PROPOFOL;  Surgeon: Lin Landsman, MD;  Location[facility] ENDOSCOPY;  Service: Gastroenterology;  Laterality: N/A;    Allergies  Allergen Reactions  . Lisinopril Other (See Comments)    Chest pain    Current Outpatient Medications  Medication Sig Dispense Refill  . amLODipine (NORVASC) 10 MG tablet Take 1 tablet (10 mg total) by mouth daily. 90 tablet 1  . ARIPiprazole (ABILIFY) 5 MG tablet Take 1 tablet (5 mg total) by mouth daily. 30 tablet 5   . aspirin EC 81 MG tablet Take 1 tablet (81 mg total) by mouth daily. 90 tablet 0  . atorvastatin (LIPITOR) 40 MG tablet Take 1 tablet (40 mg total) by mouth daily. 90 tablet 1  . dapagliflozin propanediol (FARXIGA) 5 MG TABS tablet Take 5 mg by mouth daily. 90 tablet 1  . fenofibrate 54 MG tablet Take 1 tablet (54 mg total) by mouth daily. 90 tablet 1  . gabapentin (NEURONTIN) 600 MG tablet Take 1 tablet (600 mg total) by mouth 3 (three) times daily as needed. 90 tablet 3  . glucose blood test strip Use as instructed 100 each 12  . hydrocortisone (ANUSOL-HC) 2.5 % rectal cream Place 1 application rectally 2 (two) times daily. 60 g 1  . insulin degludec (TRESIBA) 100 UNIT/ML SOPN FlexTouch Pen Inject 0.65 mLs (65 Units total) into the skin at bedtime. 3 mL 3  . insulin glulisine (APIDRA) 100 UNIT/ML injection Inject 0.08 mLs (8 Units total) into the skin 2 (two) times daily after a meal. Before dinner 10 mL 11  . Insulin Pen Needle (PEN NEEDLES) 32G X 4 MM MISC 1 Units by Does not apply route 3 (three) times daily as needed. 100 each 11  . metoprolol succinate (TOPROL-XL) 25 MG 24 hr tablet Take 1 tablet (25 mg total) by mouth daily. 90 tablet 1  . metroNIDAZOLE (METROCREAM) 0.75 % cream Apply  topically 2 (two) times daily. 45 g 0  . Semaglutide,0.25 or 0.5MG /DOS, (OZEMPIC, 0.25 OR 0.5 MG/DOSE,) 2 MG/1.5ML SOPN Inject 0.25 mg into the skin once a week. 1 pen 2   No current facility-administered medications for this visit.     Family History Family History  Problem Relation Age of Onset  . Hypertension Mother   . Hypertension Sister   . Hyperlipidemia Sister   . Hyperlipidemia Sister   . Hypertension Sister   . Diabetes Maternal Grandmother        Social History Social History   Tobacco Use  . Smoking status: Current Every Day Smoker    Packs/day: 0.50    Years: 30.00    Pack years: 15.00    Types: Cigarettes  . Smokeless tobacco: Never Used  Substance Use Topics  . Alcohol  use: No    Frequency: Never  . Drug use: No        Review of Systems  Constitutional: Negative for weight loss.  HENT: Negative.   Eyes: Negative.   Respiratory: Negative.   Cardiovascular: Negative.   Gastrointestinal: Positive for abdominal pain, constipation and diarrhea. Negative for blood in stool, melena, nausea and vomiting.  Genitourinary: Negative.   Musculoskeletal: Negative.   Skin: Positive for itching. Negative for rash.  Neurological: Negative.     Physical Exam Blood pressure (!) 143/101, pulse 77, temperature (!) 97.2 F (36.2 C), temperature source Temporal, resp. rate 14, height 6' (1.829 m), weight 253 lb (114.8 kg), SpO2 98 %.  CONSTITUTIONAL: Well-developed, well-nourished African-American male in no immediate distress.  Pacing floor, shifting from side to side knowing he needs to have a bowel movement. EARS, NOSE, MOUTH AND THROAT: The oropharynx is clear. Oral mucosa is pink and moist. Hearing is intact to voice.  NECK: Trachea is midline, and there is no jugular venous distension.  LYMPH NODES:  Lymph nodes in the neck are not enlarged. RESPIRATORY:  Lungs are clear, and breath sounds are equal bilaterally. Normal respiratory effort without pathologic use of accessory muscles. CARDIOVASCULAR: Heart is regular. GI: The abdomen is well rounded, soft, nontender, and nondistended. There were no palpable masses. There was no hepatosplenomegaly. There were normal bowel sounds. GU: I positioned him need to chest to enable some form of exam, there is no evidence of external anal dermal changes.  There was tenderness circumferentially externally.  As I gently applied pressure to evaluate more of the anal canal identified a sentinel tag at 12:00, cephalad.  He was not able to tolerate any further exam as I tried to evaluate further. MUSCULOSKELETAL:  Normal muscle strength and tone in all four extremities.    SKIN: Skin turgor is normal. There are no pathologic skin  lesions.  NEUROLOGIC:  Motor and sensation is grossly normal.  Cranial nerves are grossly intact. PSYCH:  Alert and oriented to person, place and time. Affect is normal.  Data Reviewed  I have personally reviewed the patient's imaging and medical records.    Assessment    History and exam consistent with anal fissure.  Plan    In lieu of a rectal examination under anesthesia, will proceed with topical treatment utilizing nifedipine, topical Xylocaine 3 times daily.  I have also advised we utilize a bowel prep regimen as rescue for his constipation.  We discussed surgical options which include lateral internal sphincterotomy or Botox injection.  I believe his wife understands that there is hurdles he must get past to better apply the topical and hopefully  this will provide him some degree of comfort.  We will have him back in 3 weeks.  Face-to-face time spent with the patient and care providers was 45 minutes, with more than 50% of the time spent counseling, educating, and coordinating care of the patient.      [employer]ix[employer]enny Vadis Slabach        [employer] Prov....? Yes 05/05/2019, 11:02 AM

## 2019-05-10 ENCOUNTER — Telehealth: Payer: Self-pay

## 2019-05-10 NOTE — Telephone Encounter (Signed)
PA for Met onidazole initiated and submitted via [employer].   Confirmation number- [id number]

## 2019-05-10 NOTE — Telephone Encounter (Signed)
PA approved through 04/19/20.

## 2019-05-16 NOTE — Telephone Encounter (Signed)
PA approved.

## 2019-05-17 ENCOUNTER — Ambulatory Visit: Payer: Medicaid Other | Admitting: Surgery

## 2019-05-26 ENCOUNTER — Encounter: Payer: Self-pay | Admitting: Surgery

## 2019-05-26 ENCOUNTER — Ambulatory Visit (INDEPENDENT_AMBULATORY_CARE_PROVIDER_SITE_OTHER): Payer: Medicaid Other | Admitting: Surgery

## 2019-05-26 ENCOUNTER — Ambulatory Visit: Payer: Medicaid Other | Admitting: Surgery

## 2019-05-26 ENCOUNTER — Other Ambulatory Visit: Payer: Self-pay

## 2019-05-26 VITALS — BP 137/88 | HR 92 | Temp 97.7°F | Ht 72.0 in | Wt 249.2 lb

## 2019-05-26 DIAGNOSIS — K5900 Constipation, unspecified: Secondary | ICD-10-CM

## 2019-05-26 DIAGNOSIS — K601 Chronic anal fissure: Secondary | ICD-10-CM

## 2019-05-26 NOTE — Patient Instructions (Addendum)
Dr.Rodenberg recommends patient to take an over the counter Fiber supplement 25-30 grams per day and to try a Fiber diet. Patient is to prioritize drinking plenty of fluids and fiber.   Fiber Content in Foods  See the following list for the dietary fiber content of some common foods. High-fiber foods High-fiber foods contain 4 grams or more (4g or more) of fiber per serving. They include:  Artichoke (fresh) - 1 medium has 10.3g of fiber.  Baked beans, plain or vegetarian (canned) -  cup has 5.2g of fiber.  Blackberries or raspberries (fresh) -  cup has 4g of fiber.  Bran cereal -  cup has 8.6g of fiber.  Bulgur (cooked) -  cup has 4g of fiber.  Kidney beans (canned) -  cup has 6.8g of fiber.  Lentils (cooked) -  cup has 7.8g of fiber.  Pear (fresh) - 1 medium has 5.1g of fiber.  Peas (frozen) -  cup has 4.4g of fiber.  Pinto beans (canned) -  cup has 5.5g of fiber.  Pinto beans (dried and cooked) -  cup has 7.7g of fiber.  Potato with skin (baked) - 1 medium has 4.4g of fiber.  Quinoa (cooked) -  cup has 5g of fiber.  Soybeans (canned, frozen, or fresh) -  cup has 5.1g of fiber. Moderate-fiber foods Moderate-fiber foods contain 1-4 grams (1-4g) of fiber per serving. They include:  Almonds - 1 oz. has 3.5g of fiber.  Apple with skin - 1 medium has 3.3g of fiber.  Applesauce, sweetened -  cup has 1.5g of fiber.  Bagel, plain - one 4-inch (10-cm) bagel has 2g of fiber.  Banana - 1 medium has 3.1g of fiber.  Broccoli (cooked) -  cup has 2.5g of fiber.  Carrots (cooked) -  cup has 2.3g of fiber.  Corn (canned or frozen) -  cup has 2.1g of fiber.  Corn tortilla - one 6-inch (15-cm) tortilla has 1.5g of fiber.  Green beans (canned) -  cup has 2g of fiber.  Instant oatmeal -  cup has about 2g of fiber.  Long-grain brown rice (cooked) - 1 cup has 3.5g of fiber.  Macaroni, enriched (cooked) - 1 cup has 2.5g of fiber.  Melon - 1 cup has 1.4g of  fiber.  Multigrain cereal -  cup has about 2-4g of fiber.  Orange - 1 small has 3.1g of fiber.  Potatoes, mashed -  cup has 1.6g of fiber.  Raisins - 1/4 cup has 1.6g of fiber.  Squash -  cup has 2.9g of fiber.  Sunflower seeds -  cup has 1.1g of fiber.  Tomato - 1 medium has 1.5g of fiber.  Vegetable or soy patty - 1 has 3.4g of fiber.  Whole-wheat bread - 1 slice has 2g of fiber.  Whole-wheat spaghetti -  cup has 3.2g of fiber. Low-fiber foods Low-fiber foods contain less than 1 gram (less than 1g) of fiber per serving. They include:  Egg - 1 large.  Flour tortilla - one 6-inch (15-cm) tortilla.  Fruit juice -  cup.  Lettuce - 1 cup.  Meat, poultry, or fish - 1 oz.  Milk - 1 cup.  Spinach (raw) - 1 cup.  White bread - 1 slice.  White rice -  cup.  Yogurt -  cup. Actual amounts of fiber in foods may be different depending on processing. Talk with your dietitian about how much fiber you need in your diet. This information is not intended to replace advice given to  you by your health care provider. Make sure you discuss any questions you have with your health care provider. Document Released: 10/26/2006 Document Revised: 01/31/2016 Document Reviewed: 08/02/2015 Elsevier Patient Education  2020 [employer].

## 2019-05-26 NOTE — Progress Notes (Signed)
Surgical Clinic Progress/Follow-up Note   HPI:  53 y.o. Male presents to clinic for anal pain follow-up 2 weeks following the last evaluation. Patient reports marked improvement/resolution of prior issues, having pain occur only twice weekly and has been tolerating regular diet with +flatus and 2-3 normal BM's daily, denies N/V, fever/chills, CP, or SOB.  This is common with supplementation utilizing additional MiraLAX as needed.  He is currently using only be topical hydrocortisone metronidazole creams. Review of Systems:  Constitutional: denies fever/chills  ENT: denies sore throat, hearing problems  Respiratory: denies shortness of breath, wheezing  Cardiovascular: denies chest pain, palpitations  Gastrointestinal: denies abdominal pain, N/V, or diarrhea/and bowel function as per interval history Skin: Denies any other rashes or skin discolorations except post-surgical wounds as per interval history  Vital Signs:  BP 137/88   Pulse 92   Temp 97.7 F (36.5 C) (Temporal)   Ht 6' (1.829 m)   Wt 249 lb 3.2 oz (113 kg)   SpO2 97%   BMI 33.80 kg/m    Physical Exam:  Constitutional:  -- Normal/Obese body habitus  -- Awake, alert, and oriented x3  Pulmonary:  -- No crackles -- Equal breath sounds bilaterally -- Breathing non-labored at rest Cardiovascular:  -- S1, S2 present  -- No pericardial rubs  Gastrointestinal:  -- Soft and non-distended, non-tender/with no tenderness to palpation, no guarding/rebound tenderness  GU  --reexamination deferred due to patient's excellent progress. Musculoskeletal / Integumentary:   -- Extremities: B/L UE and LE FROM, hands and feet warm, no edema   Laboratory studies: n/a  Imaging: No new pertinent imaging available for review   Assessment:  53 y.o. yo Male with a problem list including...   Patient Active Problem List   Diagnosis Date Noted  . Knee effusion, left 01/20/2019  . Rectal bleeding   . Major depression, recurrent ([place])  02/25/2018  . Bipolar disorder ([place]) 02/25/2018  . Tobacco use disorder 02/25/2018  . Morbid obesity ([place]) 02/25/2018  . Erectile dysfunction 02/25/2018  . Constipation 02/25/2018  . Bloody stool 02/25/2018  . Hyperlipidemia 08/20/2017  . Type 2 diabetes mellitus with diabetic neuropathy, unspecified ([place]) 08/20/2017  . Hypertension 08/20/2017  . Pneumococcal vaccine refused 12/18/2015  . 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  Plan:              - return to clinic 5 weeks for as needed, instructed to call office if any questions or concerns We discussed the importance of fiber supplementation and a high-fiber diet, in order to minimize the need/demand or MiraLAX. All of the above recommendations were discussed with the patient, and all of patient's questions were answered to his expressed satisfaction.  Ronny Bacon, MD, FACS [place]: [place] [facility]. Office: [phone]

## 2019-06-23 ENCOUNTER — Encounter: Payer: Self-pay | Admitting: Podiatry

## 2019-06-23 ENCOUNTER — Other Ambulatory Visit: Payer: Self-pay

## 2019-06-23 ENCOUNTER — Ambulatory Visit: Payer: Medicaid Other | Admitting: Podiatry

## 2019-06-23 DIAGNOSIS — B351 Tinea unguium: Secondary | ICD-10-CM

## 2019-06-23 DIAGNOSIS — E1140 Type 2 diabetes mellitus with diabetic neuropathy, unspecified: Secondary | ICD-10-CM

## 2019-06-23 DIAGNOSIS — M79675 Pain in left toe(s): Secondary | ICD-10-CM

## 2019-06-23 DIAGNOSIS — M79674 Pain in right toe(s): Secondary | ICD-10-CM

## 2019-06-23 NOTE — Progress Notes (Signed)
Complaint:  Visit Type: Patient returns to my office for continued preventative foot care services. Complaint: Patient states" my nails have grown long and thick and become painful to walk and wear shoes" Patient has been diagnosed with DM with no foot complications. The patient presents for preventative foot care services.  Podiatric Exam: Vascular: dorsalis pedis and posterior tibial pulses are palpable bilateral. Capillary return is immediate. Temperature gradient is WNL. Skin turgor WNL  Sensorium: Normal Semmes Weinstein monofilament test. Normal tactile sensation bilaterally. Nail Exam: Pt has thick disfigured discolored nails with subungual debris noted bilateral entire nail hallux through fifth toenails Ulcer Exam: There is no evidence of ulcer or pre-ulcerative changes or infection. Orthopedic Exam: Muscle tone and strength are WNL. No limitations in general ROM. No crepitus or effusions noted. Foot type and digits show no abnormalities. Bony prominences are unremarkable. Skin: No Porokeratosis. No infection or ulcers  Diagnosis:  Onychomycosis, , Pain in right toe, pain in left toes  Treatment & Plan Procedures and Treatment: Consent by patient was obtained for treatment procedures.   Debridement of mycotic and hypertrophic toenails, 1 through 5 bilateral and clearing of subungual debris. No ulceration, no infection noted.  Return Visit-Office Procedure: Patient instructed to return to the office for a follow up visit 3 months for continued evaluation and treatment.    Aubreyana Saltz DPM 

## 2019-06-25 ENCOUNTER — Other Ambulatory Visit: Payer: Self-pay | Admitting: Family Medicine

## 2019-06-30 ENCOUNTER — Other Ambulatory Visit: Payer: Self-pay

## 2019-06-30 ENCOUNTER — Ambulatory Visit: Payer: Self-pay | Admitting: Surgery

## 2019-06-30 ENCOUNTER — Ambulatory Visit (INDEPENDENT_AMBULATORY_CARE_PROVIDER_SITE_OTHER): Payer: Medicaid Other | Admitting: Surgery

## 2019-06-30 ENCOUNTER — Encounter: Payer: Self-pay | Admitting: Surgery

## 2019-06-30 VITALS — BP 125/78 | HR 87 | Temp 98.1°F | Resp 12 | Ht 72.0 in | Wt 248.2 lb

## 2019-06-30 DIAGNOSIS — K645 Perianal venous thrombosis: Secondary | ICD-10-CM

## 2019-06-30 DIAGNOSIS — K601 Chronic anal fissure: Secondary | ICD-10-CM | POA: Insufficient documentation

## 2019-06-30 NOTE — Patient Instructions (Addendum)
You will need to pick up a couple of over the counter Fleet's enema at your local pharmacy to do the day prior and the day of your surgery.  Our Surgery scheduler will contact you within 24-48 hours to schedule your surgery. Please have your Blue Sheet available when she calls you.   Hemorrhoids Hemorrhoids are swollen veins in and around the rectum or anus. There are two types of hemorrhoids:  Internal hemorrhoids. These occur in the veins that are just inside the rectum. They may poke through to the outside and become irritated and painful.  External hemorrhoids. These occur in the veins that are outside the anus and can be felt as a painful swelling or hard lump near the anus. Most hemorrhoids do not cause serious problems, and they can be managed with home treatments such as diet and lifestyle changes. If home treatments do not help the symptoms, procedures can be done to shrink or remove the hemorrhoids. What are the causes? This condition is caused by increased pressure in the anal area. This pressure may result from various things, including:  Constipation.  Straining to have a bowel movement.  Diarrhea.  Pregnancy.  Obesity.  Sitting for long periods of time.  Heavy lifting or other activity that causes you to strain.  Anal sex.  Riding a bike for a long period of time. What are the signs or symptoms? Symptoms of this condition include:  Pain.  Anal itching or irritation.  Rectal bleeding.  Leakage of stool (feces).  Anal swelling.  One or more lumps around the anus. How is this diagnosed? This condition can often be diagnosed through a visual exam. Other exams or tests may also be done, such as:  An exam that involves feeling the rectal area with a gloved hand (digital rectal exam).  An exam of the anal canal that is done using a small tube (anoscope).  A blood test, if you have lost a significant amount of blood.  A test to look inside the colon using  a flexible tube with a camera on the end (sigmoidoscopy or colonoscopy). How is this treated? This condition can usually be treated at home. However, various procedures may be done if dietary changes, lifestyle changes, and other home treatments do not help your symptoms. These procedures can help make the hemorrhoids smaller or remove them completely. Some of these procedures involve surgery, and others do not. Common procedures include:  Rubber band ligation. Rubber bands are placed at the base of the hemorrhoids to cut off their blood supply.  Sclerotherapy. Medicine is injected into the hemorrhoids to shrink them.  Infrared coagulation. A type of light energy is used to get rid of the hemorrhoids.  Hemorrhoidectomy surgery. The hemorrhoids are surgically removed, and the veins that supply them are tied off.  Stapled hemorrhoidopexy surgery. The surgeon staples the base of the hemorrhoid to the rectal wall. Follow these instructions at home: Eating and drinking   Eat foods that have a lot of fiber in them, such as whole grains, beans, nuts, fruits, and vegetables.  Ask your health care provider about taking products that have added fiber (fiber supplements).  Reduce the amount of fat in your diet. You can do this by eating low-fat dairy products, eating less red meat, and avoiding processed foods.  Drink enough fluid to keep your urine pale yellow. Managing pain and swelling   Take warm sitz baths for 20 minutes, 3-4 times a day to ease pain and discomfort.  You may do this in a bathtub or using a portable sitz bath that fits over the toilet.  If directed, apply ice to the affected area. Using ice packs between sitz baths may be helpful. ? Put ice in a plastic bag. ? Place a towel between your skin and the bag. ? Leave the ice on for 20 minutes, 2-3 times a day. General instructions  Take over-the-counter and prescription medicines only as told by your health care  provider.  Use medicated creams or suppositories as told.  Get regular exercise. Ask your health care provider how much and what kind of exercise is best for you. In general, you should do moderate exercise for at least 30 minutes on most days of the week (150 minutes each week). This can include activities such as walking, biking, or yoga.  Go to the bathroom when you have the urge to have a bowel movement. Do not wait.  Avoid straining to have bowel movements.  Keep the anal area dry and clean. Use wet toilet paper or moist towelettes after a bowel movement.  Do not sit on the toilet for long periods of time. This increases blood pooling and pain.  Keep all follow-up visits as told by your health care provider. This is important. Contact a health care provider if you have:  Increasing pain and swelling that are not controlled by treatment or medicine.  Difficulty having a bowel movement, or you are unable to have a bowel movement.  Pain or inflammation outside the area of the hemorrhoids. Get help right away if you have:  Uncontrolled bleeding from your rectum. Summary  Hemorrhoids are swollen veins in and around the rectum or anus.  Most hemorrhoids can be managed with home treatments such as diet and lifestyle changes.  Taking warm sitz baths can help ease pain and discomfort.  In severe cases, procedures or surgery can be done to shrink or remove the hemorrhoids. This information is not intended to replace advice given to you by your health care provider. Make sure you discuss any questions you have with your health care provider. Document Revised: 11/05/2018 Document Reviewed: 10/29/2017 Elsevier Patient Education  [place].

## 2019-06-30 NOTE — H&P (View-Only) (Signed)
Patient ID: Lance Vaughan, male   DOB: 10/14/1965, 54 y.o.   MRN: [id number]  Chief Complaint: Anal rectal pain 10 out of 10, 24/7.  History of Present Illness [name] is a 54 y.o. male with prior visits that seem consistent with an anal fissure.  On his last appearance he reported feeling much better getting his bowel movements to be more frequent.  He reports he continues to take 5 of his gummy fibers daily and reports having 2-3 bowel movements daily.  He also reports that every bowel movement exacerbates pain that is maximal and somehow is able to remain maximal throughout the day and night.  He reports taking large doses of naproxen and ibuprofen to help with his pain relief.  He continues to have blood bright red blood with the stools.  He does not report bleeding that persists after his bowel movement.  He is ready to proceed with examination under anesthesia.  Past Medical History Past Medical History:  Diagnosis Date  . Arthritis   . Bipolar disorder (HCC)   . Bursitis   . Depression   . Diabetes mellitus without complication (HCC)   . Diverticulitis   . Hyperlipidemia   . Hypertension   . Psychosis (HCC)   . Psychosis [facility])       Past Surgical History:  Procedure Laterality Date  . COLONOSCOPY WITH PROPOFOL N/A 05/03/2018   Procedure: COLONOSCOPY WITH PROPOFOL;  Surgeon: Toney Reil, MD;  Location: [facility] ENDOSCOPY;  Service: Gastroenterology;  Laterality: N/A;    Allergies  Allergen Reactions  . Lisinopril Other (See Comments)    Chest pain    Current Outpatient Medications  Medication Sig Dispense Refill  . ACCU-CHEK AVIVA PLUS test strip TEST TWICE DAILY 100 each 12  . amLODipine (NORVASC) 10 MG tablet Take 1 tablet (10 mg total) by mouth daily. 90 tablet 1  . ARIPiprazole (ABILIFY) 5 MG tablet Take 1 tablet (5 mg total) by mouth daily. 30 tablet 5  . aspirin EC 81 MG tablet Take 1 tablet (81 mg total) by mouth daily. 90 tablet 0  . atorvastatin (LIPITOR) 40  MG tablet Take 1 tablet (40 mg total) by mouth daily. 90 tablet 1  . bisacodyl (DULCOLAX) 5 MG EC tablet Take all 4 tablets at once 4 tablet 0  . dapagliflozin propanediol (FARXIGA) 5 MG TABS tablet Take 5 mg by mouth daily. 90 tablet 1  . fenofibrate 54 MG tablet Take 1 tablet (54 mg total) by mouth daily. 90 tablet 1  . gabapentin (NEURONTIN) 600 MG tablet Take 1 tablet (600 mg total) by mouth 3 (three) times daily as needed. 90 tablet 3  . hydrocortisone (ANUSOL-HC) 2.5 % rectal cream Place 1 application rectally 2 (two) times daily. 60 g 1  . insulin degludec (TRESIBA) 100 UNIT/ML SOPN FlexTouch Pen Inject 0.65 mLs (65 Units total) into the skin at bedtime. 3 mL 3  . insulin glulisine (APIDRA) 100 UNIT/ML injection Inject 0.08 mLs (8 Units total) into the skin 2 (two) times daily after a meal. Before dinner 10 mL 11  . lidocaine (XYLOCAINE) 5 % ointment Apply 1 application topically as needed. Apply rectally three times a day 35.44 g 1  . metoprolol succinate (TOPROL-XL) 25 MG 24 hr tablet Take 1 tablet (25 mg total) by mouth daily. 90 tablet 1  . metroNIDAZOLE (METROCREAM) 0.75 % cream Apply topically 2 (two) times daily. 45 g 0  . polyethylene glycol powder (MIRALAX) 17 GM/SCOOP powder Mix full container  in 64 ounces of Gatorade or other clear liquid. 238 g 0  . Semaglutide,0.25 or 0.5MG/DOS, (OZEMPIC, 0.25 OR 0.5 MG/DOSE,) 2 MG/1.5ML SOPN Inject 0.25 mg into the skin once a week. 1 pen 2  . ULTRACARE PEN NEEDLES 32G X 4 MM MISC USE 3 TIMES DAILY AS NEEDED 100 each 11   No current facility-administered medications for this visit.    Family History Family History  Problem Relation Age of Onset  . Hypertension Mother   . Hypertension Sister   . Hyperlipidemia Sister   . Hyperlipidemia Sister   . Hypertension Sister   . Diabetes Maternal Grandmother       Social History Social History   Tobacco Use  . Smoking status: Current Every Day Smoker    Packs/day: 0.50    Years: 30.00     Pack years: 15.00    Types: Cigarettes  . Smokeless tobacco: Never Used  Substance Use Topics  . Alcohol use: No  . Drug use: No        Review of Systems  Constitutional: Negative.   HENT: Negative.   Eyes: Negative.   Respiratory: Negative.   Cardiovascular: Negative.   Gastrointestinal: Positive for abdominal pain and blood in stool. Negative for diarrhea, melena and vomiting.  Genitourinary: Negative.   Musculoskeletal: Negative.   Skin: Negative.   Neurological: Negative.   Endo/Heme/Allergies: Negative.       Physical Exam Blood pressure 125/78, pulse 87, temperature 98.1 F (36.7 C), temperature source Temporal, resp. rate 12, height 6' (1.829 m), weight 248 lb 3.2 oz (112.6 kg), SpO2 97 %. Last Weight  Most recent update: 06/30/2019  9:01 AM   Weight  112.6 kg (248 lb 3.2 oz)            CONSTITUTIONAL: Well developed, and nourished, appropriately responsive and aware without distress.   EYES: Sclera non-icteric.   EARS, NOSE, MOUTH AND THROAT: Mask worn. Hearing is intact to voice.  NECK: Trachea is midline, and there is no jugular venous distension.  LYMPH NODES:  Lymph nodes in the neck are not enlarged. RESPIRATORY:  Lungs are clear, and breath sounds are equal bilaterally. Normal respiratory effort without pathologic use of accessory muscles. CARDIOVASCULAR: Heart is regular in rate and rhythm. GI: The abdomen is well rounded soft, nontender, and nondistended. There were no palpable masses. GU: There is a palpable cord consistent with a singular thrombosed hemorrhoid at anterior midline, exam is limited by either sphincter spasm or a proximal anal stenosis which with attempts to penetrate were intolerable.  No perianal induration, fluctuance or increased calor consistent with perianal abscess.  The surrounding perianal skin was otherwise unremarkable. MUSCULOSKELETAL:  Symmetrical muscle tone appreciated in all four extremities.    SKIN: Skin turgor is  normal. No pathologic skin lesions appreciated.  NEUROLOGIC:  Motor and sensation appear grossly normal.  Cranial nerves are grossly without defect. PSYCH:  Alert and oriented to person, place and time. Affect is appropriate for situation.  Data Reviewed I have personally reviewed what is currently available of the patient's imaging, recent labs and medical records.     Assessment    Anal pain, possible anal fissure, probable thrombosed hemorrhoid. Patient Active Problem List   Diagnosis Date Noted  . Knee effusion, left 01/20/2019  . Rectal bleeding   . Major depression, recurrent (HCC) 02/25/2018  . Bipolar disorder (HCC) 02/25/2018  . Tobacco use disorder 02/25/2018  . Morbid obesity (HCC) 02/25/2018  . Erectile dysfunction 02/25/2018  .   Constipation 02/25/2018  . Bloody stool 02/25/2018  . Hyperlipidemia 08/20/2017  . Type 2 diabetes mellitus with diabetic neuropathy, unspecified ([place]) 08/20/2017  . Hypertension 08/20/2017  . Pneumococcal vaccine refused 12/18/2015  . Diverticulosis of colon 07/26/2015    Plan    Rectal examination under anesthesia, possible hemorrhoidectomy, possible lateral internal sphincterotomy. Risk discussed with the patient which include but are not limited to anesthesia, bleeding, infection, anal sphincteric dysfunction/stenosis, temporary incontinence of flatus versus stool soilage etc.  Believe the patient understands desires to proceed.  Questions answered, no guarantees ever expressed or implied.  Face-to-face time spent with the patient and accompanying care providers(if present) was 30 minutes, with more than 50% of the time spent counseling, educating, and coordinating care of the patient.      Ronny Bacon M.D., FACS 06/30/2019, 9:33 AM

## 2019-06-30 NOTE — Progress Notes (Signed)
Patient ID: Lance Vaughan, male   DOB: 10/14/1965, 54 y.o.   MRN: [id number]  Chief Complaint: Anal rectal pain 10 out of 10, 24/7.  History of Present Illness Lance Vaughan is a 54 y.o. male with prior visits that seem consistent with an anal fissure.  On his last appearance he reported feeling much better getting his bowel movements to be more frequent.  He reports he continues to take 5 of his gummy fibers daily and reports having 2-3 bowel movements daily.  He also reports that every bowel movement exacerbates pain that is maximal and somehow is able to remain maximal throughout the day and night.  He reports taking large doses of naproxen and ibuprofen to help with his pain relief.  He continues to have blood bright red blood with the stools.  He does not report bleeding that persists after his bowel movement.  He is ready to proceed with examination under anesthesia.  Past Medical History Past Medical History:  Diagnosis Date  . Arthritis   . Bipolar disorder (HCC)   . Bursitis   . Depression   . Diabetes mellitus without complication (HCC)   . Diverticulitis   . Hyperlipidemia   . Hypertension   . Psychosis (HCC)   . Psychosis [facility])       Past Surgical History:  Procedure Laterality Date  . COLONOSCOPY WITH PROPOFOL N/A 05/03/2018   Procedure: COLONOSCOPY WITH PROPOFOL;  Surgeon: Toney Reil, MD;  Location: [facility] ENDOSCOPY;  Service: Gastroenterology;  Laterality: N/A;    Allergies  Allergen Reactions  . Lisinopril Other (See Comments)    Chest pain    Current Outpatient Medications  Medication Sig Dispense Refill  . ACCU-CHEK AVIVA PLUS test strip TEST TWICE DAILY 100 each 12  . amLODipine (NORVASC) 10 MG tablet Take 1 tablet (10 mg total) by mouth daily. 90 tablet 1  . ARIPiprazole (ABILIFY) 5 MG tablet Take 1 tablet (5 mg total) by mouth daily. 30 tablet 5  . aspirin EC 81 MG tablet Take 1 tablet (81 mg total) by mouth daily. 90 tablet 0  . atorvastatin (LIPITOR) 40  MG tablet Take 1 tablet (40 mg total) by mouth daily. 90 tablet 1  . bisacodyl (DULCOLAX) 5 MG EC tablet Take all 4 tablets at once 4 tablet 0  . dapagliflozin propanediol (FARXIGA) 5 MG TABS tablet Take 5 mg by mouth daily. 90 tablet 1  . fenofibrate 54 MG tablet Take 1 tablet (54 mg total) by mouth daily. 90 tablet 1  . gabapentin (NEURONTIN) 600 MG tablet Take 1 tablet (600 mg total) by mouth 3 (three) times daily as needed. 90 tablet 3  . hydrocortisone (ANUSOL-HC) 2.5 % rectal cream Place 1 application rectally 2 (two) times daily. 60 g 1  . insulin degludec (TRESIBA) 100 UNIT/ML SOPN FlexTouch Pen Inject 0.65 mLs (65 Units total) into the skin at bedtime. 3 mL 3  . insulin glulisine (APIDRA) 100 UNIT/ML injection Inject 0.08 mLs (8 Units total) into the skin 2 (two) times daily after a meal. Before dinner 10 mL 11  . lidocaine (XYLOCAINE) 5 % ointment Apply 1 application topically as needed. Apply rectally three times a day 35.44 g 1  . metoprolol succinate (TOPROL-XL) 25 MG 24 hr tablet Take 1 tablet (25 mg total) by mouth daily. 90 tablet 1  . metroNIDAZOLE (METROCREAM) 0.75 % cream Apply topically 2 (two) times daily. 45 g 0  . polyethylene glycol powder (MIRALAX) 17 GM/SCOOP powder Mix full container  in 64 ounces of Gatorade or other clear liquid. 238 g 0  . Semaglutide,0.25 or 0.5MG /DOS, (OZEMPIC, 0.25 OR 0.5 MG/DOSE,) 2 MG/1.5ML SOPN Inject 0.25 mg into the skin once a week. 1 pen 2  . ULTRACARE PEN NEEDLES 32G X 4 MM MISC USE 3 TIMES DAILY AS NEEDED 100 each 11   No current facility-administered medications for this visit.    Family History Family History  Problem Relation Age of Onset  . Hypertension Mother   . Hypertension Sister   . Hyperlipidemia Sister   . Hyperlipidemia Sister   . Hypertension Sister   . Diabetes Maternal Grandmother       Social History Social History   Tobacco Use  . Smoking status: Current Every Day Smoker    Packs/day: 0.50    Years: 30.00     Pack years: 15.00    Types: Cigarettes  . Smokeless tobacco: Never Used  Substance Use Topics  . Alcohol use: No  . Drug use: No        Review of Systems  Constitutional: Negative.   HENT: Negative.   Eyes: Negative.   Respiratory: Negative.   Cardiovascular: Negative.   Gastrointestinal: Positive for abdominal pain and blood in stool. Negative for diarrhea, melena and vomiting.  Genitourinary: Negative.   Musculoskeletal: Negative.   Skin: Negative.   Neurological: Negative.   Endo/Heme/Allergies: Negative.       Physical Exam Blood pressure 125/78, pulse 87, temperature 98.1 F (36.7 C), temperature source Temporal, resp. rate 12, height 6' (1.829 m), weight 248 lb 3.2 oz (112.6 kg), SpO2 97 %. Last Weight  Most recent update: 06/30/2019  9:01 AM   Weight  112.6 kg (248 lb 3.2 oz)            CONSTITUTIONAL: Well developed, and nourished, appropriately responsive and aware without distress.   EYES: Sclera non-icteric.   EARS, NOSE, MOUTH AND THROAT: Mask worn. Hearing is intact to voice.  NECK: Trachea is midline, and there is no jugular venous distension.  LYMPH NODES:  Lymph nodes in the neck are not enlarged. RESPIRATORY:  Lungs are clear, and breath sounds are equal bilaterally. Normal respiratory effort without pathologic use of accessory muscles. CARDIOVASCULAR: Heart is regular in rate and rhythm. GI: The abdomen is well rounded soft, nontender, and nondistended. There were no palpable masses. GU: There is a palpable cord consistent with a singular thrombosed hemorrhoid at anterior midline, exam is limited by either sphincter spasm or a proximal anal stenosis which with attempts to penetrate were intolerable.  No perianal induration, fluctuance or increased calor consistent with perianal abscess.  The surrounding perianal skin was otherwise unremarkable. MUSCULOSKELETAL:  Symmetrical muscle tone appreciated in all four extremities.    SKIN: Skin turgor is  normal. No pathologic skin lesions appreciated.  NEUROLOGIC:  Motor and sensation appear grossly normal.  Cranial nerves are grossly without defect. PSYCH:  Alert and oriented to person, place and time. Affect is appropriate for situation.  Data Reviewed I have personally reviewed what is currently available of the patient's imaging, recent labs and medical records.     Assessment    Anal pain, possible anal fissure, probable thrombosed hemorrhoid. Patient Active Problem List   Diagnosis Date Noted  . Knee effusion, left 01/20/2019  . Rectal bleeding   . Major depression, recurrent (HCC) 02/25/2018  . Bipolar disorder (HCC) 02/25/2018  . Tobacco use disorder 02/25/2018  . Morbid obesity (HCC) 02/25/2018  . Erectile dysfunction 02/25/2018  .  Constipation 02/25/2018  . Bloody stool 02/25/2018  . Hyperlipidemia 08/20/2017  . Type 2 diabetes mellitus with diabetic neuropathy, unspecified ([place]) 08/20/2017  . Hypertension 08/20/2017  . Pneumococcal vaccine refused 12/18/2015  . Diverticulosis of colon 07/26/2015    Plan    Rectal examination under anesthesia, possible hemorrhoidectomy, possible lateral internal sphincterotomy. Risk discussed with the patient which include but are not limited to anesthesia, bleeding, infection, anal sphincteric dysfunction/stenosis, temporary incontinence of flatus versus stool soilage etc.  Believe the patient understands desires to proceed.  Questions answered, no guarantees ever expressed or implied.  Face-to-face time spent with the patient and accompanying care providers(if present) was 30 minutes, with more than 50% of the time spent counseling, educating, and coordinating care of the patient.      Ronny Bacon M.D., FACS 06/30/2019, 9:33 AM

## 2019-07-01 ENCOUNTER — Other Ambulatory Visit: Admission: RE | Admit: 2019-07-01 | Payer: Medicaid Other | Source: Ambulatory Visit

## 2019-07-01 ENCOUNTER — Telehealth: Payer: Self-pay | Admitting: Surgery

## 2019-07-01 NOTE — Telephone Encounter (Signed)
Pt has been advised of pre admission date/time, Covid Testing date and Surgery date.  Surgery Date: 07/06/19 Preadmission Testing Date: 07/01/19 Covid Testing Date: 07/04/19 - patient advised to go to [facility] ([address] [facility])  Patient has been made aware to call [phone], between 1-3:00pm the day before surgery, to find out what time to arrive.

## 2019-07-04 ENCOUNTER — Other Ambulatory Visit
Admission: RE | Admit: 2019-07-04 | Discharge: 2019-07-04 | Disposition: A | Discharge status: Home / Self Care | Payer: Medicaid Other | Source: Ambulatory Visit | Attending: Surgery | Admitting: Surgery

## 2019-07-04 ENCOUNTER — Other Ambulatory Visit: Payer: Self-pay

## 2019-07-04 DIAGNOSIS — Z20822 Contact with and (suspected) exposure to covid-19: Secondary | ICD-10-CM | POA: Insufficient documentation

## 2019-07-04 DIAGNOSIS — Z01812 Encounter for preprocedural laboratory examination: Secondary | ICD-10-CM | POA: Insufficient documentation

## 2019-07-04 LAB — SARS CORONAVIRUS 2 (TAT 6-24 HRS): SARS Coronavirus 2: NEGATIVE

## 2019-07-05 MED ORDER — METRONIDAZOLE IN NACL 5-0.79 MG/ML-% IV SOLN
500.0000 mg | INTRAVENOUS | Status: AC
  Administered 2019-07-06: 500 mg via INTRAVENOUS
  Filled 2019-07-05: qty 100

## 2019-07-06 ENCOUNTER — Ambulatory Visit: Payer: Medicaid Other | Admitting: Family

## 2019-07-06 ENCOUNTER — Encounter: Payer: Self-pay | Admitting: Surgery

## 2019-07-06 ENCOUNTER — Other Ambulatory Visit: Payer: Self-pay

## 2019-07-06 ENCOUNTER — Ambulatory Visit
Admission: RE | Admit: 2019-07-06 | Discharge: 2019-07-06 | Disposition: A | Discharge status: Home / Self Care | Payer: Medicaid Other | Source: Ambulatory Visit | Attending: Surgery | Admitting: Surgery

## 2019-07-06 ENCOUNTER — Encounter
Admission: RE | Disposition: A | Discharge status: Home / Self Care | Payer: Self-pay | Source: Ambulatory Visit | Attending: Surgery

## 2019-07-06 DIAGNOSIS — Z7982 Long term (current) use of aspirin: Secondary | ICD-10-CM | POA: Insufficient documentation

## 2019-07-06 DIAGNOSIS — Z794 Long term (current) use of insulin: Secondary | ICD-10-CM | POA: Diagnosis not present

## 2019-07-06 DIAGNOSIS — K602 Anal fissure, unspecified: Secondary | ICD-10-CM | POA: Diagnosis not present

## 2019-07-06 DIAGNOSIS — E785 Hyperlipidemia, unspecified: Secondary | ICD-10-CM | POA: Diagnosis not present

## 2019-07-06 DIAGNOSIS — M1990 Unspecified osteoarthritis, unspecified site: Secondary | ICD-10-CM | POA: Insufficient documentation

## 2019-07-06 DIAGNOSIS — K645 Perianal venous thrombosis: Secondary | ICD-10-CM

## 2019-07-06 DIAGNOSIS — E119 Type 2 diabetes mellitus without complications: Secondary | ICD-10-CM | POA: Insufficient documentation

## 2019-07-06 DIAGNOSIS — K641 Second degree hemorrhoids: Secondary | ICD-10-CM | POA: Diagnosis not present

## 2019-07-06 DIAGNOSIS — I10 Essential (primary) hypertension: Secondary | ICD-10-CM | POA: Diagnosis not present

## 2019-07-06 DIAGNOSIS — F319 Bipolar disorder, unspecified: Secondary | ICD-10-CM | POA: Diagnosis not present

## 2019-07-06 DIAGNOSIS — Z79899 Other long term (current) drug therapy: Secondary | ICD-10-CM | POA: Insufficient documentation

## 2019-07-06 HISTORY — PX: RECTAL EXAM UNDER ANESTHESIA: SHX6399

## 2019-07-06 HISTORY — PX: HEMORRHOID SURGERY: SHX153

## 2019-07-06 LAB — GLUCOSE, CAPILLARY
Glucose-Capillary: 102 mg/dL — ABNORMAL HIGH (ref 70–99)
Glucose-Capillary: 102 mg/dL — ABNORMAL HIGH (ref 70–99)

## 2019-07-06 SURGERY — EXAM UNDER ANESTHESIA, RECTUM
Anesthesia: General

## 2019-07-06 MED ORDER — GELATIN ABSORBABLE 100 EX MISC
CUTANEOUS | Status: DC | PRN
  Administered 2019-07-06: 1

## 2019-07-06 MED ORDER — FAMOTIDINE 20 MG PO TABS: 20.0000 mg | ORAL_TABLET | Freq: Once | ORAL | Status: AC

## 2019-07-06 MED ORDER — CHLORHEXIDINE GLUCONATE CLOTH 2 % EX PADS: 6.0000 | MEDICATED_PAD | Freq: Once | CUTANEOUS | Status: DC

## 2019-07-06 MED ORDER — DIBUCAINE (PERIANAL) 1 % EX OINT
TOPICAL_OINTMENT | CUTANEOUS | Status: AC
  Filled 2019-07-06: qty 28

## 2019-07-06 MED ORDER — EPINEPHRINE PF 1 MG/ML IJ SOLN
INTRAMUSCULAR | Status: AC
  Filled 2019-07-06: qty 1

## 2019-07-06 MED ORDER — KETOROLAC TROMETHAMINE 30 MG/ML IJ SOLN
INTRAMUSCULAR | Status: AC
  Filled 2019-07-06: qty 1

## 2019-07-06 MED ORDER — BUPIVACAINE-EPINEPHRINE 0.25% -1:200000 IJ SOLN
INTRAMUSCULAR | Status: DC | PRN
  Administered 2019-07-06: 30 mL

## 2019-07-06 MED ORDER — FENTANYL CITRATE (PF) 100 MCG/2ML IJ SOLN
25.0000 ug | INTRAMUSCULAR | Status: DC | PRN
  Administered 2019-07-06 (×3): 25 ug via INTRAVENOUS

## 2019-07-06 MED ORDER — MIDAZOLAM HCL 2 MG/2ML IJ SOLN
INTRAMUSCULAR | Status: AC
  Filled 2019-07-06: qty 2

## 2019-07-06 MED ORDER — PROPOFOL 10 MG/ML IV BOLUS
INTRAVENOUS | Status: DC | PRN
  Administered 2019-07-06: 180 mg via INTRAVENOUS

## 2019-07-06 MED ORDER — FENTANYL CITRATE (PF) 100 MCG/2ML IJ SOLN
INTRAMUSCULAR | Status: AC
  Administered 2019-07-06: 25 ug via INTRAVENOUS
  Filled 2019-07-06: qty 2

## 2019-07-06 MED ORDER — ROCURONIUM BROMIDE 100 MG/10ML IV SOLN
INTRAVENOUS | Status: DC | PRN
  Administered 2019-07-06: 50 mg via INTRAVENOUS

## 2019-07-06 MED ORDER — SODIUM CHLORIDE 0.9 % IV SOLN: INTRAVENOUS | Status: DC

## 2019-07-06 MED ORDER — LIDOCAINE HCL (CARDIAC) PF 100 MG/5ML IV SOSY
PREFILLED_SYRINGE | INTRAVENOUS | Status: DC | PRN
  Administered 2019-07-06: 80 mg via INTRAVENOUS

## 2019-07-06 MED ORDER — FLEET ENEMA 7-19 GM/118ML RE ENEM: 1.0000 | ENEMA | Freq: Once | RECTAL | Status: DC

## 2019-07-06 MED ORDER — DIBUCAINE 1 % EX OINT
TOPICAL_OINTMENT | CUTANEOUS | Status: DC | PRN
  Administered 2019-07-06: 1

## 2019-07-06 MED ORDER — ONDANSETRON HCL 4 MG/2ML IJ SOLN: 4.0000 mg | Freq: Once | INTRAMUSCULAR | Status: DC | PRN

## 2019-07-06 MED ORDER — FENTANYL CITRATE (PF) 100 MCG/2ML IJ SOLN
INTRAMUSCULAR | Status: AC
  Filled 2019-07-06: qty 2

## 2019-07-06 MED ORDER — FENTANYL CITRATE (PF) 100 MCG/2ML IJ SOLN
INTRAMUSCULAR | Status: DC | PRN
  Administered 2019-07-06 (×2): 50 ug via INTRAVENOUS

## 2019-07-06 MED ORDER — ROCURONIUM BROMIDE 50 MG/5ML IV SOLN
INTRAVENOUS | Status: AC
  Filled 2019-07-06: qty 1

## 2019-07-06 MED ORDER — BUPIVACAINE LIPOSOME 1.3 % IJ SUSP
INTRAMUSCULAR | Status: DC | PRN
  Administered 2019-07-06: 20 mL

## 2019-07-06 MED ORDER — DIBUCAINE (PERIANAL) 1 % EX OINT: 1.0000 "application " | TOPICAL_OINTMENT | CUTANEOUS | 2 refills | Status: DC | PRN

## 2019-07-06 MED ORDER — BUPIVACAINE LIPOSOME 1.3 % IJ SUSP
INTRAMUSCULAR | Status: AC
  Filled 2019-07-06: qty 20

## 2019-07-06 MED ORDER — SUGAMMADEX SODIUM 200 MG/2ML IV SOLN
INTRAVENOUS | Status: DC | PRN
  Administered 2019-07-06: 150 mg via INTRAVENOUS

## 2019-07-06 MED ORDER — FAMOTIDINE 20 MG PO TABS
ORAL_TABLET | ORAL | Status: AC
  Administered 2019-07-06: 20 mg via ORAL
  Filled 2019-07-06: qty 1

## 2019-07-06 MED ORDER — HYDROCODONE-ACETAMINOPHEN 5-325 MG PO TABS: 1.0000 | ORAL_TABLET | Freq: Four times a day (QID) | ORAL | 0 refills | Status: DC | PRN

## 2019-07-06 MED ORDER — BUPIVACAINE HCL (PF) 0.25 % IJ SOLN
INTRAMUSCULAR | Status: AC
  Filled 2019-07-06: qty 30

## 2019-07-06 MED ORDER — IBUPROFEN 800 MG PO TABS: 800.0000 mg | ORAL_TABLET | Freq: Three times a day (TID) | ORAL | 0 refills | Status: DC | PRN

## 2019-07-06 MED ORDER — LIDOCAINE HCL (PF) 2 % IJ SOLN
INTRAMUSCULAR | Status: AC
  Filled 2019-07-06: qty 10

## 2019-07-06 MED ORDER — PROPOFOL 10 MG/ML IV BOLUS
INTRAVENOUS | Status: AC
  Filled 2019-07-06: qty 40

## 2019-07-06 MED ORDER — GELATIN ABSORBABLE 100 CM EX MISC
CUTANEOUS | Status: AC
  Filled 2019-07-06: qty 1

## 2019-07-06 MED ORDER — BUPIVACAINE LIPOSOME 1.3 % IJ SUSP: 20.0000 mL | Freq: Once | INTRAMUSCULAR | Status: DC

## 2019-07-06 MED ORDER — ACETAMINOPHEN 500 MG PO TABS: 1000.0000 mg | ORAL_TABLET | ORAL | Status: AC

## 2019-07-06 MED ORDER — SUGAMMADEX SODIUM 200 MG/2ML IV SOLN
INTRAVENOUS | Status: AC
  Filled 2019-07-06: qty 2

## 2019-07-06 MED ORDER — KETOROLAC TROMETHAMINE 30 MG/ML IJ SOLN
INTRAMUSCULAR | Status: DC | PRN
  Administered 2019-07-06: 30 mg via INTRAVENOUS

## 2019-07-06 MED ORDER — ACETAMINOPHEN 500 MG PO TABS
ORAL_TABLET | ORAL | Status: AC
  Administered 2019-07-06: 11:00:00 1000 mg via ORAL
  Filled 2019-07-06: qty 2

## 2019-07-06 MED ORDER — ONDANSETRON HCL 4 MG/2ML IJ SOLN
INTRAMUSCULAR | Status: AC
  Filled 2019-07-06: qty 2

## 2019-07-06 SURGICAL SUPPLY — 30 items
BLADE SURG 15 STRL LF DISP TIS (BLADE) ×1 IMPLANT
BLADE SURG 15 STRL SS (BLADE) ×2
BRIEF STRETCH MATERNITY 2XLG (MISCELLANEOUS) ×3 IMPLANT
CANISTER SUCT 1200ML W/VALVE (MISCELLANEOUS) ×3 IMPLANT
COVER WAND RF STERILE (DRAPES) ×3 IMPLANT
DRAPE LAPAROTOMY 100X77 ABD (DRAPES) ×3 IMPLANT
DRSG GAUZE FLUFF 36X18 (GAUZE/BANDAGES/DRESSINGS) ×3 IMPLANT
ELECT CAUTERY BLADE TIP 2.5 (TIP) ×3
ELECT REM PT RETURN 9FT ADLT (ELECTROSURGICAL) ×3
ELECTRODE CAUTERY BLDE TIP 2.5 (TIP) ×1 IMPLANT
ELECTRODE REM PT RTRN 9FT ADLT (ELECTROSURGICAL) ×1 IMPLANT
GAUZE SPONGE 4X4 12PLY STRL (GAUZE/BANDAGES/DRESSINGS) ×3 IMPLANT
GLOVE ORTHO TXT STRL SZ7.5 (GLOVE) ×6 IMPLANT
GOWN STRL REUS W/ TWL LRG LVL3 (GOWN DISPOSABLE) ×2 IMPLANT
GOWN STRL REUS W/TWL LRG LVL3 (GOWN DISPOSABLE) ×4
KIT TURNOVER KIT A (KITS) ×3 IMPLANT
NEEDLE HYPO 22GX1.5 SAFETY (NEEDLE) ×3 IMPLANT
PACK BASIN MINOR ARMC (MISCELLANEOUS) ×3 IMPLANT
SHEARS HARMONIC 9CM CVD (BLADE) ×3 IMPLANT
SOL PREP PVP 2OZ (MISCELLANEOUS) ×3
SOLUTION PREP PVP 2OZ (MISCELLANEOUS) ×1 IMPLANT
SPONGE SURGIFOAM ABS GEL 100 (HEMOSTASIS) ×3 IMPLANT
STAPLER PROXIMATE HCS (STAPLE) IMPLANT
SURGILUBE 2OZ TUBE FLIPTOP (MISCELLANEOUS) ×3 IMPLANT
SUT CHROMIC 3 0 SH 27 (SUTURE) ×3 IMPLANT
SUT PROLENE 2 0 SH DA (SUTURE) IMPLANT
SUT PROLENE 3 0 SH DA (SUTURE) IMPLANT
SWABSTK COMLB BENZOIN TINCTURE (MISCELLANEOUS) ×3 IMPLANT
SYR 10ML LL (SYRINGE) ×3 IMPLANT
TAPE CLOTH 3X10 WHT NS LF (GAUZE/BANDAGES/DRESSINGS) ×3 IMPLANT

## 2019-07-06 NOTE — Op Note (Signed)
SURGICAL OPERATIVE REPORT   DATE OF PROCEDURE: 07/06/2019  ATTENDING Surgeon(s): [name], MD  ANESTHESIA: GETA  PRE-OPERATIVE DIAGNOSIS: Symptomatic (painful)/Bleeding 2nd degree internal and external thrombosed hemorrhoid with anal fissure with symptoms not well-controlled despite medical management  POST-OPERATIVE DIAGNOSIS: Same.   PROCEDURE(S):  1.) Anoscopy 2.) Two column hemorrhectomy (Right anterior thrombosed internal/external, Left posterior internal, and left internal lateral sphincterotomy.   INTRAOPERATIVE FINDINGS: 2nd degree internal hemorrhoids and external thrombosed hemorrhoid with anal fissure.   ESTIMATED BLOOD LOSS: Minimal (<20 mL)  URINE OUTPUT: No foley   SPECIMENS: Right anterior internal/external thrombosed hemorrhoid, and Left posterior internal hemorrhoidal column.   IMPLANTS: None  DRAINS: None   COMPLICATIONS: None apparent   CONDITION AT END OF PROCEDURE: Hemodynamically stable and extubated   DISPOSITION OF PATIENT: PACU  INDICATIONS FOR PROCEDURE:   Patient is a 54 y.o. male who presented with symptomatic (painful) 2nd degree  External thrombosed hemorrhoid and fissure not well-controlled with medical management.  All risks, benefits, and alternatives to above procedure were discussed with the patient, all of patient's questions were answered to his/her expressed satisfaction, and informed consent was obtained and documented.  DETAILS OF PROCEDURE: Patient was brought to the operative suite and appropriately identified. General anesthesia was administered along with appropriate pre-operative antibiotics, and endotracheal intubation was performed by anesthetist.  Patient transferred to prone positioning with jackknife flecture, buttocks retracted with tape and benzoin.  Operative site was prepped and draped in the usual sterile fashion, and following a brief time out, rigid anoscopy was performed with findings of anterior and posterior  fissure with thrombosed hemorrhoid at right anterior, prominent nonthrombosed pile at left posterior. Local anesthetic was injected, and 2 hemorrhoidal pedicles were identified. An Alice clamp was placed near the base of each pedicle/first the thrombosed right anterior pedicle and then the internal left posterior pedicle near the dentate line and retracted externally to exteriorize the hemorrhoidal pedicle. Retracted hemorrhoid was carefully dissected from underlying/adherent tissues, and the harmonic focus was advanced across the base of the hemorrhoidal pedicle, and the above was repeated.  On the left lateral site I dissected the intersphincteric groove, and divided the distal majority of the internal sphincter muscle.  I then reapproximated the overlying anal dermis with locking running 3-0 chromic for hemostasis attempting not to reapproximate the underlying muscle. Hemostasis was achieved/confirmed, additional local anesthetic was injected, and dibucaine ointment-soaked Gelfoam was inserted into the anal canal for additional hemostasis and pain control.  Patient was then safely able to be extubated, awakened, and transferred to PACU for post-operative monitoring and care.  I was present for all aspects of the above procedure, and no operative complications were apparent.

## 2019-07-06 NOTE — Transfer of Care (Signed)
Immediate Anesthesia Transfer of Care Note  Patient: Shimon Trowbridge  Procedure(s) Performed: RECTAL EXAM UNDER ANESTHESIA (N/A ) HEMORRHOIDECTOMY (N/A )  Patient Location: PACU  Anesthesia Type:General  Level of Consciousness: awake, drowsy and patient cooperative  Airway & Oxygen Therapy: Patient Spontanous Breathing and Patient connected to face mask oxygen  Post-op Assessment: Report given to RN and Post -op Vital signs reviewed and stable  Post vital signs: Reviewed and stable  Last Vitals:  Vitals Value Taken Time  BP 126/84 07/06/19 1408  Temp 36.2 C 07/06/19 1408  Pulse 69 07/06/19 1412  Resp 18 07/06/19 1412  SpO2 100 % 07/06/19 1412  Vitals shown include unvalidated device data.  Last Pain:  Vitals:   07/06/19 1031  TempSrc: Tympanic  PainSc: 0-No pain         Complications: No apparent anesthesia complications

## 2019-07-06 NOTE — Anesthesia Preprocedure Evaluation (Signed)
Anesthesia Evaluation  Patient identified by MRN, date of birth, ID band Patient awake    Reviewed: Allergy & Precautions, NPO status , Patient's Chart, lab work & pertinent test results  History of Anesthesia Complications Negative for: history of anesthetic complications  Airway Mallampati: II       Dental   Pulmonary neg sleep apnea, neg COPD, Current Smoker and Patient abstained from smoking.,           Cardiovascular hypertension, Pt. on medications (-) Past MI and (-) CHF (-) dysrhythmias (-) Valvular Problems/Murmurs     Neuro/Psych neg Seizures PSYCHIATRIC DISORDERS Depression Bipolar Disorder Schizophrenia negative neurological ROS     GI/Hepatic Neg liver ROS, neg GERD  ,  Endo/Other  diabetes, Type 2, Oral Hypoglycemic Agents, Insulin Dependent  Renal/GU negative Renal ROS  negative genitourinary   Musculoskeletal  (+) Arthritis , Osteoarthritis,    Abdominal   Peds negative pediatric ROS (+)  Hematology   Anesthesia Other Findings   Reproductive/Obstetrics                             Anesthesia Physical  Anesthesia Plan  ASA: III  Anesthesia Plan: General   Post-op Pain Management:    Induction: Intravenous  PONV Risk Score and Plan: 1  Airway Management Planned: Oral ETT  Additional Equipment:   Intra-op Plan:   Post-operative Plan: Extubation in OR  Informed Consent: I have reviewed the patients History and Physical, chart, labs and discussed the procedure including the risks, benefits and alternatives for the proposed anesthesia with the patient or authorized representative who has indicated his/her understanding and acceptance.     Dental advisory given  Plan Discussed with: CRNA and Surgeon  Anesthesia Plan Comments:         Anesthesia Quick Evaluation

## 2019-07-06 NOTE — Anesthesia Procedure Notes (Signed)
Procedure Name: Intubation Date/Time: 07/06/2019 12:50 PM Performed by: Manning Charity, CRNA Pre-anesthesia Checklist: Patient identified, Emergency Drugs available, Suction available and Patient being monitored Patient Re-evaluated:Patient Re-evaluated prior to induction Oxygen Delivery Method: Circle system utilized Preoxygenation: Pre-oxygenation with 100% oxygen Induction Type: IV induction Ventilation: Mask ventilation without difficulty Laryngoscope Size: Miller and 4 Grade View: Grade III Tube type: Oral Number of attempts: 1 Airway Equipment and Method: Stylet Placement Confirmation: ETT inserted through vocal cords under direct vision,  positive ETCO2 and breath sounds checked- equal and bilateral Secured at: 25 cm Tube secured with: Tape Dental Injury: Teeth and Oropharynx as per pre-operative assessment

## 2019-07-06 NOTE — Discharge Instructions (Addendum)
AMBULATORY SURGERY  DISCHARGE INSTRUCTIONS   1) The drugs that you were given will stay in your system until tomorrow so for the next 24 hours you should not:  A) Drive an automobile B) Make any legal decisions C) Drink any alcoholic beverage   2) You may resume regular meals tomorrow.  Today it is better to start with liquids and gradually work up to solid foods.  You may eat anything you prefer, but it is better to start with liquids, then soup and crackers, and gradually work up to solid foods.   3) Please notify your doctor immediately if you have any unusual bleeding, trouble breathing, redness and pain at the surgery site, drainage, fever, or pain not relieved by medication. 4)   5) Your post-operative visit with Dr.                                     is: Date:                        Time:    Please call to schedule your post-operative visit.  6) Additional Instructions:     Soak in shallow warm tub of water for at least 20 minutes 3 times daily.  Repeat soaking for anal hygiene following bowel movement.  May apply dibucaine ointment to perianal skin as needed for pain.

## 2019-07-06 NOTE — Interval H&P Note (Signed)
History and Physical Interval Note:  07/06/2019 12:22 PM  Lance Vaughan  has presented today for surgery, with the diagnosis of thrombosed hemorroid.  The various methods of treatment have been discussed with the patient and family. After consideration of risks, benefits and other options for treatment, the patient has consented to  Procedure(s): RECTAL EXAM UNDER ANESTHESIA (N/A) HEMORRHOIDECTOMY (N/A) as a surgical intervention.  The patient's history has been reviewed, patient examined, no change in status, stable for surgery.  I have reviewed the patient's chart and labs.  Questions were answered to the patient's satisfaction.     [name]

## 2019-07-07 NOTE — Anesthesia Postprocedure Evaluation (Signed)
Anesthesia Post Note  Patient: Lance Vaughan  Procedure(s) Performed: RECTAL EXAM UNDER ANESTHESIA (N/A ) HEMORRHOIDECTOMY (N/A )  Patient location during evaluation: PACU Anesthesia Type: General Level of consciousness: awake and alert Pain management: pain level controlled Vital Signs Assessment: post-procedure vital signs reviewed and stable Respiratory status: spontaneous breathing, nonlabored ventilation and respiratory function stable Cardiovascular status: blood pressure returned to baseline and stable Postop Assessment: no apparent nausea or vomiting Anesthetic complications: no     Last Vitals:  Vitals:   07/06/19 1523 07/06/19 1545  BP: (!) 143/99 (!) 149/91  Pulse: 66 63  Resp: 13 18  Temp:    SpO2: 98% 100%    Last Pain:  Vitals:   07/06/19 1545  TempSrc:   PainSc: 0-No pain                 Aurelio Brash Annaleigha Woo

## 2019-07-08 ENCOUNTER — Telehealth: Payer: Self-pay | Admitting: Surgery

## 2019-07-08 LAB — SURGICAL PATHOLOGY

## 2019-07-08 NOTE — Telephone Encounter (Signed)
Return call to patient and patient states he is nervous to have a bowel movement since his surgery Wednesday. Patient asked if he may take a stool softener to help soften up his stool. Informed patient he may take a Colace or Dulcolax to help with his stool. Also advised patient to give our office a call back if he has any questions or concerns.

## 2019-07-08 NOTE — Telephone Encounter (Signed)
Patient is calling and is asking about taken a Stool softner. Please call patient and advise.

## 2019-07-21 ENCOUNTER — Encounter: Payer: Medicaid Other | Admitting: Surgery

## 2019-08-25 ENCOUNTER — Ambulatory Visit: Payer: Medicaid Other | Admitting: Nurse Practitioner

## 2019-08-25 ENCOUNTER — Ambulatory Visit: Payer: Medicaid Other | Admitting: Family Medicine

## 2019-09-22 ENCOUNTER — Ambulatory Visit: Payer: Medicaid Other | Admitting: Podiatry

## 2019-09-27 ENCOUNTER — Other Ambulatory Visit: Payer: Self-pay | Admitting: Family Medicine

## 2019-10-06 ENCOUNTER — Ambulatory Visit: Payer: Medicaid Other | Admitting: Nurse Practitioner

## 2019-11-03 ENCOUNTER — Ambulatory Visit: Payer: Self-pay | Admitting: *Deleted

## 2019-11-03 NOTE — Telephone Encounter (Signed)
Pt called in c/o chest pain that is radiating into his left shoulder and into his upper back.   This has been going on for a month now and is getting worse.   "I've been taking Tylenol and ibuprofen but the pain is still getting worse".  He does smoke.   Also has diabetes which he takes insulin for. He has a cardiac history per pt but "I don't remember what they told me was wrong".   "It was back in the 90s".   "Maybe something with a valve". He is laying in bed now with his left arm held up in the air.  "It seems to help the pain a little".  I have referred him to the ED per the protocol.   He said,  "Ok".   He never committed to going to the ED so not sure if he will go or not.  I asked him which hospital he would go to and he said,  "The only hospital here in [place]".   I instructed him to go on over there.  I let him know to call 911.   [facility])  See triage notes.  I sent my notes to [facility].  He has been a No Show for the last few appts he had with them.   Reason for Disposition . [1] Chest pain lasts > 5 minutes AND [2] age > 58 AND [3] one or more cardiac risk factors (e.g., diabetes, high blood pressure, high cholesterol, smoker, or strong family history of heart disease)  Answer Assessment - Initial Assessment Questions 1. LOCATION: "Where does it hurt?"       I'm having chest pain going into my left shoulder.  It's going into my back. 2. RADIATION: "Does the pain go anywhere else?" (e.g., into neck, jaw, arms, back)     Left shoulder and back. 3. ONSET: "When did the chest pain begin?" (Minutes, hours or days)      Been going on for a month or longer.   It's not going away.   I'm taking Tylenol and ibuprofen but the pain but the pain is not going away. 4. PATTERN "Does the pain come and go, or has it been constant since it started?"  "Does it get worse with exertion?"      Constant pain.   The pain is worse when I drive.   I'm laying down  now with my arm in the air.   When I put my arm down it hurts real bad. 5. DURATION: "How long does it last" (e.g., seconds, minutes, hours)     Constant 6. SEVERITY: "How bad is the pain?"  (e.g., Scale 1-10; mild, moderate, or severe)    - MILD (1-3): doesn't interfere with normal activities     - MODERATE (4-7): interferes with normal activities or awakens from sleep    - SEVERE (8-10): excruciating pain, unable to do any normal activities       Moderate 7. CARDIAC RISK FACTORS: "Do you have any history of heart problems or risk factors for heart disease?" (e.g., angina, prior heart attack; diabetes, high blood pressure, high cholesterol, smoker, or strong family history of heart disease)     I have heart problems but I don't remember what is wrong.  I've been in hospital for chest pain in 1990s. 8. PULMONARY RISK FACTORS: "Do you have any history of lung disease?"  (e.g., blood clots in lung, asthma, emphysema, birth control pills)  I do smoke.   No blood clot history. I am diabetic and use insulin. 9. CAUSE: "What do you think is causing the chest pain?"     I think pulled muscle but I've not done anything to have a pulled muscle. 10. OTHER SYMPTOMS: "Do you have any other symptoms?" (e.g., dizziness, nausea, vomiting, sweating, fever, difficulty breathing, cough)       No dizziness, sweating, nausea, or shortness of breath. 11. PREGNANCY: "Is there any chance you are pregnant?" "When was your last menstrual period?"       N/A  Protocols used: CHEST PAIN-A-AH

## 2019-11-03 NOTE — Telephone Encounter (Signed)
Called and spoke with patient. Let him know Dr. Henriette Combs message. Patient states he is laying down right now and is going to go to the ER in a little bit. Strongly encouraged patient to be seen ASAP and to call us if he needs anything.

## 2019-11-03 NOTE — Telephone Encounter (Signed)
FYI

## 2019-11-03 NOTE — Telephone Encounter (Signed)
Please check to make sure he goes to ER if not there by 10:30 (1 hour from now) Please call and strongly encourage him to go

## 2019-11-04 ENCOUNTER — Other Ambulatory Visit: Payer: Self-pay

## 2019-11-04 ENCOUNTER — Emergency Department
Admission: EM | Admit: 2019-11-04 | Discharge: 2019-11-04 | Disposition: A | Discharge status: Home / Self Care | Payer: Medicaid Other | Attending: Emergency Medicine | Admitting: Emergency Medicine

## 2019-11-04 ENCOUNTER — Telehealth: Payer: Self-pay | Admitting: Emergency Medicine

## 2019-11-04 ENCOUNTER — Emergency Department: Payer: Medicaid Other

## 2019-11-04 DIAGNOSIS — Z5321 Procedure and treatment not carried out due to patient leaving prior to being seen by health care provider: Secondary | ICD-10-CM | POA: Insufficient documentation

## 2019-11-04 DIAGNOSIS — R079 Chest pain, unspecified: Secondary | ICD-10-CM | POA: Insufficient documentation

## 2019-11-04 LAB — CBC
HCT: 47.2 % (ref 39.0–52.0)
Hemoglobin: 16.3 g/dL (ref 13.0–17.0)
MCH: 32.9 pg (ref 26.0–34.0)
MCHC: 34.5 g/dL (ref 30.0–36.0)
MCV: 95.2 fL (ref 80.0–100.0)
Platelets: 237 10*3/uL (ref 150–400)
RBC: 4.96 MIL/uL (ref 4.22–5.81)
RDW: 13.2 % (ref 11.5–15.5)
WBC: 4.4 10*3/uL (ref 4.0–10.5)
nRBC: 0 % (ref 0.0–0.2)

## 2019-11-04 LAB — BASIC METABOLIC PANEL
Anion gap: 7 (ref 5–15)
BUN: 13 mg/dL (ref 6–20)
CO2: 22 mmol/L (ref 22–32)
Calcium: 8.3 mg/dL — ABNORMAL LOW (ref 8.9–10.3)
Chloride: 110 mmol/L (ref 98–111)
Creatinine, Ser: 1.12 mg/dL (ref 0.61–1.24)
GFR calc Af Amer: >60 mL/min (ref >60)
GFR calc non Af Amer: >60 mL/min (ref >60)
Glucose, Bld: 217 mg/dL — ABNORMAL HIGH (ref 70–99)
Potassium: 4.4 mmol/L (ref 3.5–5.1)
Sodium: 139 mmol/L (ref 135–145)

## 2019-11-04 LAB — TROPONIN I (HIGH SENSITIVITY): Troponin I (High Sensitivity): 5 ng/L (ref <18)

## 2019-11-04 NOTE — Telephone Encounter (Signed)
Called patient due to lwot to inquire about condition and follow up plans. Says he will call his doctor and understands the protocol labs/xrays results are availble.

## 2019-11-04 NOTE — ED Triage Notes (Signed)
Left sided chest and axilla sharp pain that runs down left arm. Has been ongoing for months, worsening over last few days.

## 2019-11-07 ENCOUNTER — Telehealth: Payer: Self-pay | Admitting: Family Medicine

## 2019-11-07 NOTE — Telephone Encounter (Signed)
Appt scheduled for tomorrow for fu.

## 2019-11-07 NOTE — Telephone Encounter (Signed)
Pt had called in on Fri with Chest pain and had X-Rays done at [facility] and is wanting to know the results as quite anxious. Pls FU at [phone]

## 2019-11-08 ENCOUNTER — Encounter: Payer: Self-pay | Admitting: Nurse Practitioner

## 2019-11-08 ENCOUNTER — Other Ambulatory Visit: Payer: Self-pay

## 2019-11-08 ENCOUNTER — Ambulatory Visit (INDEPENDENT_AMBULATORY_CARE_PROVIDER_SITE_OTHER): Payer: Medicaid Other | Admitting: Nurse Practitioner

## 2019-11-08 VITALS — BP 128/86 | HR 92 | Temp 98.1°F | Ht 72.0 in | Wt 245.4 lb

## 2019-11-08 DIAGNOSIS — E782 Mixed hyperlipidemia: Secondary | ICD-10-CM | POA: Diagnosis not present

## 2019-11-08 DIAGNOSIS — I10 Essential (primary) hypertension: Secondary | ICD-10-CM

## 2019-11-08 DIAGNOSIS — R079 Chest pain, unspecified: Secondary | ICD-10-CM | POA: Diagnosis not present

## 2019-11-08 DIAGNOSIS — E1140 Type 2 diabetes mellitus with diabetic neuropathy, unspecified: Secondary | ICD-10-CM

## 2019-11-08 DIAGNOSIS — Z973 Presence of spectacles and contact lenses: Secondary | ICD-10-CM

## 2019-11-08 DIAGNOSIS — Z794 Long term (current) use of insulin: Secondary | ICD-10-CM

## 2019-11-08 LAB — BAYER DCA HB A1C WAIVED: HB A1C (BAYER DCA - WAIVED): 5.7 % (ref <7.0)

## 2019-11-08 MED ORDER — CYCLOBENZAPRINE HCL 10 MG PO TABS: 10.0000 mg | ORAL_TABLET | Freq: Three times a day (TID) | ORAL | 1 refills | Status: DC | PRN

## 2019-11-08 NOTE — Assessment & Plan Note (Signed)
Chronic, at goal today in office.  Per recent home readings, BP has been elevated.  Instructed on dietary and lifestyle and continue to monitor at home after pain under better control.  Call or return to clinic if BP remains > 140/90.  CBC, CMP, TSH, lipids, and A1c checked today.

## 2019-11-08 NOTE — Addendum Note (Signed)
Addended by: Marshall Cork on: 11/08/2019 04:23 PM   Modules accepted: Orders

## 2019-11-08 NOTE — Assessment & Plan Note (Signed)
Chronic, ongoing.  Patient reports good control of blood sugars at home with current regimen, continue current regimen for now.  CBC, CMP, TSH, lipids, and A1c checked today.

## 2019-11-08 NOTE — Assessment & Plan Note (Signed)
Acute, ongoing for the past month.  Went to ER about 5 days ago and left after triage, without being seen.  Discussed high risk of cardiovascular event with risk factors including diabetes and high cholesterol.  Patient adamantly refusing to return to the ER today due to the 8 hour wait he had previously.  EKG performed, looks comparable to most recent EKG.  CBC, CMP, TSH, lipids, and A1c checked.  Pain reciprocated on palpation, will treat with muscle relaxant and place urgent referral to Cardiology for further workup.

## 2019-11-08 NOTE — Patient Instructions (Signed)
Lance Vaughan, please call or return to the clinic if your BP continues to run higher than 140/90.  We want to keep a close eye on this to prevent heart failure and kidney problems.  DASH Eating Plan DASH stands for "Dietary Approaches to Stop Hypertension." The DASH eating plan is a healthy eating plan that has been shown to reduce high blood pressure (hypertension). It may also reduce your risk for type 2 diabetes, heart disease, and stroke. The DASH eating plan may also help with weight loss. What are tips for following this plan?  General guidelines  Avoid eating more than 2,300 mg (milligrams) of salt (sodium) a day. If you have hypertension, you may need to reduce your sodium intake to 1,500 mg a day.  Limit alcohol intake to no more than 1 drink a day for nonpregnant women and 2 drinks a day for men. One drink equals 12 oz of beer, 5 oz of wine, or 1 oz of hard liquor.  Work with your health care provider to maintain a healthy body weight or to lose weight. Ask what an ideal weight is for you.  Get at least 30 minutes of exercise that causes your heart to beat faster (aerobic exercise) most days of the week. Activities may include walking, swimming, or biking.  Work with your health care provider or diet and nutrition specialist (dietitian) to adjust your eating plan to your individual calorie needs. Reading food labels   Check food labels for the amount of sodium per serving. Choose foods with less than 5 percent of the Daily Value of sodium. Generally, foods with less than 300 mg of sodium per serving fit into this eating plan.  To find whole grains, look for the word "whole" as the first word in the ingredient list. Shopping  Buy products labeled as "low-sodium" or "no salt added."  Buy fresh foods. Avoid canned foods and premade or frozen meals. Cooking  Avoid adding salt when cooking. Use salt-free seasonings or herbs instead of table salt or sea salt. Check with your health care  provider or pharmacist before using salt substitutes.  Do not fry foods. Cook foods using healthy methods such as baking, boiling, grilling, and broiling instead.  Cook with heart-healthy oils, such as olive, canola, soybean, or sunflower oil. Meal planning  Eat a balanced diet that includes: ? 5 or more servings of fruits and vegetables each day. At each meal, try to fill half of your plate with fruits and vegetables. ? Up to 6-8 servings of whole grains each day. ? Less than 6 oz of lean meat, poultry, or fish each day. A 3-oz serving of meat is about the same size as a deck of cards. One egg equals 1 oz. ? 2 servings of low-fat dairy each day. ? A serving of nuts, seeds, or beans 5 times each week. ? Heart-healthy fats. Healthy fats called Omega-3 fatty acids are found in foods such as flaxseeds and coldwater fish, like sardines, salmon, and mackerel.  Limit how much you eat of the following: ? Canned or prepackaged foods. ? Food that is high in trans fat, such as fried foods. ? Food that is high in saturated fat, such as fatty meat. ? Sweets, desserts, sugary drinks, and other foods with added sugar. ? Full-fat dairy products.  Do not salt foods before eating.  Try to eat at least 2 vegetarian meals each week.  Eat more home-cooked food and less restaurant, buffet, and fast food.  When eating  at a restaurant, ask that your food be prepared with less salt or no salt, if possible. What foods are recommended? The items listed may not be a complete list. Talk with your dietitian about what dietary choices are best for you. Grains Whole-grain or whole-wheat bread. Whole-grain or whole-wheat pasta. Brown rice. [place]. Bulgur. Whole-grain and low-sodium cereals. Pita bread. Low-fat, low-sodium crackers. Whole-wheat flour tortillas. Vegetables Fresh or frozen vegetables (raw, steamed, roasted, or grilled). Low-sodium or reduced-sodium tomato and vegetable juice. Low-sodium or  reduced-sodium tomato sauce and tomato paste. Low-sodium or reduced-sodium canned vegetables. Fruits All fresh, dried, or frozen fruit. Canned fruit in natural juice (without added sugar). Meat and other protein foods Skinless chicken or [place]. Ground chicken or [place]. Pork with fat trimmed off. Fish and seafood. Egg whites. Dried beans, peas, or lentils. Unsalted nuts, nut butters, and seeds. Unsalted canned beans. Lean cuts of beef with fat trimmed off. Low-sodium, lean deli meat. Dairy Low-fat (1%) or fat-free (skim) milk. Fat-free, low-fat, or reduced-fat cheeses. Nonfat, low-sodium ricotta or cottage cheese. Low-fat or nonfat yogurt. Low-fat, low-sodium cheese. Fats and oils Soft margarine without trans fats. Vegetable oil. Low-fat, reduced-fat, or light mayonnaise and salad dressings (reduced-sodium). Canola, safflower, olive, soybean, and sunflower oils. Avocado. Seasoning and other foods Herbs. Spices. Seasoning mixes without salt. Unsalted popcorn and pretzels. Fat-free sweets. What foods are not recommended? The items listed may not be a complete list. Talk with your dietitian about what dietary choices are best for you. Grains Baked goods made with fat, such as croissants, muffins, or some breads. Dry pasta or rice meal packs. Vegetables Creamed or fried vegetables. Vegetables in a cheese sauce. Regular canned vegetables (not low-sodium or reduced-sodium). Regular canned tomato sauce and paste (not low-sodium or reduced-sodium). Regular tomato and vegetable juice (not low-sodium or reduced-sodium). Angie Fava. Olives. Fruits Canned fruit in a light or heavy syrup. Fried fruit. Fruit in cream or butter sauce. Meat and other protein foods Fatty cuts of meat. Ribs. Fried meat. Berniece Salines. Sausage. Bologna and other processed lunch meats. Salami. Fatback. Hotdogs. Bratwurst. Salted nuts and seeds. Canned beans with added salt. Canned or smoked fish. Whole eggs or egg yolks. Chicken or [place]  with skin. Dairy Whole or 2% milk, cream, and half-and-half. Whole or full-fat cream cheese. Whole-fat or sweetened yogurt. Full-fat cheese. Nondairy creamers. Whipped toppings. Processed cheese and cheese spreads. Fats and oils Butter. Stick margarine. Lard. Shortening. Ghee. Bacon fat. Tropical oils, such as coconut, palm kernel, or palm oil. Seasoning and other foods Salted popcorn and pretzels. Onion salt, garlic salt, seasoned salt, table salt, and sea salt. Worcestershire sauce. Tartar sauce. Barbecue sauce. Teriyaki sauce. Soy sauce, including reduced-sodium. Steak sauce. Canned and packaged gravies. Fish sauce. Oyster sauce. Cocktail sauce. Horseradish that you find on the shelf. Ketchup. Mustard. Meat flavorings and tenderizers. Bouillon cubes. Hot sauce and Tabasco sauce. Premade or packaged marinades. Premade or packaged taco seasonings. Relishes. Regular salad dressings. Where to find more information:  National Heart, Lung, and [place]: https://wilson-eaton.com/  American Heart Association: www.heart.org Summary  The DASH eating plan is a healthy eating plan that has been shown to reduce high blood pressure (hypertension). It may also reduce your risk for type 2 diabetes, heart disease, and stroke.  With the DASH eating plan, you should limit salt (sodium) intake to 2,300 mg a day. If you have hypertension, you may need to reduce your sodium intake to 1,500 mg a day.  When on the DASH eating plan, aim to  eat more fresh fruits and vegetables, whole grains, lean proteins, low-fat dairy, and heart-healthy fats.  Work with your health care provider or diet and nutrition specialist (dietitian) to adjust your eating plan to your individual calorie needs. This information is not intended to replace advice given to you by your health care provider. Make sure you discuss any questions you have with your health care provider. Document Revised: 05/22/2017 Document Reviewed:  06/02/2016 Elsevier Patient Education  2020 [employer].

## 2019-11-08 NOTE — Assessment & Plan Note (Signed)
Discussed dietary and lifestyle changes.  Continue to work on these.

## 2019-11-08 NOTE — Assessment & Plan Note (Signed)
Chronic, ongoing.  Continue statin.  CBC, CMP, TSH, lipids, and A1c checked today.

## 2019-11-08 NOTE — Progress Notes (Signed)
BP 128/86 (BP Location: Left Arm, Patient Position: Sitting, Cuff Size: Normal)   Pulse 92   Temp 98.1 F (36.7 C) (Oral)   Ht 6' (1.829 m)   Wt 245 lb 6.4 oz (111.3 kg)   SpO2 99%   BMI 33.28 kg/m    Subjective:    Patient ID: Lance Vaughan, male    DOB: 17-Feb-1966, 54 y.o.   MRN: [id number]  HPI: Lance Vaughan is a 54 y.o. male presenting for ER follow up.  Chief Complaint  Patient presents with  . Chest Pain    Here for chest x-ray results from ER.   . Arm Pain  . Hand Pain   ER FOLLOW UP Time since discharge: 4 days Hospital/facility: [facility] ED Diagnosis: none; left without being seen after triage Procedures/tests: EKG, BMP, CBC, Troponin, CXR Consultants: none New medications: none; left without being seen  Discharge instructions:  None, left without being seen Status: stable   CHEST PAIN Time since onset: Duration:months Onset: sudden Quality: sharp pain Severity: severe Location: left lateral chest radiating to posterior back and down left arm to elbow Radiation: left arm Episode duration: constant Frequency: constant Related to exertion: no Activity when pain started: does not remember Trauma: no Anxiety/recent stressors: no Aggravating factors: laying down, movement of arm Alleviating factors: 12 Ibuprofen Status: stable Treatments attempted: Ibuprofen, Tylenol, muscle rub, icy hot patches, vicks   Current pain status: in pain Shortness of breath: no Cough: no Nausea: no  Vomiting: no Diaphoresis: no Heartburn: no Palpitations: no  Patient reports chest pain has not changed since going to the hospital.  He is stating he does not want to go back to the Emergency Room because the wait was a very long time.  DIABETES Hypoglycemic episodes:no Polydipsia/polyuria: no Visual disturbance: no Chest pain: yes Paresthesias: no Glucose Monitoring: yes  Accucheck frequency: BID  Evening: 99-110s Taking Insulin?: yes  Short acting insulin: Apridra 10  units Blood Pressure Monitoring: daily           BP range: 170s/90s Retinal Examination: Not up to Date Foot Exam: patient declined Diabetic Education: Not Completed Pneumovax: Up to Date Influenza: deferred to flu season Aspirin: yes  Allergies  Allergen Reactions  . Lisinopril Other (See Comments)    Chest pain   Outpatient Encounter Medications as of 11/08/2019  Medication Sig Note  . ACCU-CHEK AVIVA PLUS test strip TEST TWICE DAILY   . amLODipine (NORVASC) 10 MG tablet Take 1 tablet (10 mg total) by mouth daily.   Marland Kitchen aspirin EC 81 MG tablet Take 1 tablet (81 mg total) by mouth daily.   Marland Kitchen atorvastatin (LIPITOR) 40 MG tablet Take 1 tablet (40 mg total) by mouth daily.   . dapagliflozin propanediol (FARXIGA) 5 MG TABS tablet Take 5 mg by mouth daily.   . dibucaine (NUPERCAINAL) 1 % OINT Place 1 application rectally as needed for hemorrhoids.   Marland Kitchen gabapentin (NEURONTIN) 600 MG tablet TAKE 1 TABLET BY MOUTH 3 TIMES DAILY AS NEEDED   . hydrocortisone (ANUSOL-HC) 2.5 % rectal cream Place 1 application rectally 2 (two) times daily. (Patient taking differently: Place 1 application rectally 2 (two) times daily as needed (irritation.). )   . ibuprofen (ADVIL) 800 MG tablet Take 1 tablet (800 mg total) by mouth every 8 (eight) hours as needed.   . insulin degludec (TRESIBA) 100 UNIT/ML SOPN FlexTouch Pen Inject 0.65 mLs (65 Units total) into the skin at bedtime.   . insulin glulisine (APIDRA) 100 UNIT/ML injection  Inject 0.08 mLs (8 Units total) into the skin 2 (two) times daily after a meal. Before dinner (Patient taking differently: Inject 10 Units into the skin daily with supper. )   . lidocaine (XYLOCAINE) 5 % ointment Apply 1 application topically as needed. Apply rectally three times a day   . metoprolol succinate (TOPROL-XL) 25 MG 24 hr tablet Take 1 tablet (25 mg total) by mouth daily.   . metroNIDAZOLE (METROCREAM) 0.75 % cream Apply topically 2 (two) times daily. 11/08/2019: PRN only  .  polyethylene glycol powder (MIRALAX) 17 GM/SCOOP powder Mix full container in 64 ounces of Gatorade or other clear liquid. (Patient taking differently: Take 17 g by mouth daily. )   . Semaglutide,0.25 or 0.5MG /DOS, (OZEMPIC, 0.25 OR 0.5 MG/DOSE,) 2 MG/1.5ML SOPN Inject 0.25 mg into the skin once a week. (Patient taking differently: Inject 0.5 mg into the skin every Sunday. )   . ULTRACARE PEN NEEDLES 32G X 4 MM MISC USE 3 TIMES DAILY AS NEEDED   . cyclobenzaprine (FLEXERIL) 10 MG tablet Take 1 tablet (10 mg total) by mouth 3 (three) times daily as needed for muscle spasms.   Marland Kitchen HYDROcodone-acetaminophen (NORCO/VICODIN) 5-325 MG tablet Take 1 tablet by mouth every 6 (six) hours as needed for moderate pain. (Patient not taking: Reported on 11/08/2019)   . [DISCONTINUED] ARIPiprazole (ABILIFY) 5 MG tablet Take 1 tablet (5 mg total) by mouth daily. (Patient not taking: Reported on 07/01/2019)   . [DISCONTINUED] bisacodyl (DULCOLAX) 5 MG EC tablet Take all 4 tablets at once (Patient not taking: Reported on 07/01/2019)   . [DISCONTINUED] cyclobenzaprine (FLEXERIL) 10 MG tablet Take 1 tablet (10 mg total) by mouth 3 (three) times daily as needed for muscle spasms.   . [DISCONTINUED] fenofibrate 54 MG tablet Take 1 tablet (54 mg total) by mouth daily. (Patient not taking: Reported on 07/01/2019)    No facility-administered encounter medications on file as of 11/08/2019.   Patient Active Problem List   Diagnosis Date Noted  . Chest pain 11/08/2019  . Perianal venous thrombosis 06/30/2019  . 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 Knee effusion, left 01/20/2019  . Rectal bleeding   . Major depression, recurrent ([place]) 02/25/2018  . Bipolar disorder ([place]) 02/25/2018  . Tobacco use disorder 02/25/2018  . Morbid obesity ([place]) 02/25/2018  . Erectile dysfunction 02/25/2018  . Constipation 02/25/2018  . Bloody stool 02/25/2018  . Hyperlipidemia 08/20/2017  . Type 2 diabetes mellitus with diabetic neuropathy,  unspecified ([place]) 08/20/2017  . Hypertension 08/20/2017  . Pneumococcal vaccine refused 12/18/2015  . Diverticulosis of colon 07/26/2015   Past Medical History:  Diagnosis Date  . Arthritis   . Bipolar disorder ([place])   . Bursitis   . Depression   . Diabetes mellitus without complication ([place])   . Diverticulitis   . Hyperlipidemia   . Hypertension   . Psychosis ([place])   . Psychosis ([place])    Relevant past medical, surgical, family and social history reviewed and updated as indicated. Interim medical history since our last visit reviewed.  Review of Systems  Constitutional: Negative.  Negative for activity change, appetite change, fatigue and fever.  Eyes: Negative.  Negative for visual disturbance.  Respiratory: Negative.  Negative for cough, choking, chest tightness, shortness of breath and wheezing.   Cardiovascular: Positive for chest pain. Negative for palpitations and leg swelling.  Gastrointestinal: Negative.  Negative for blood in stool, diarrhea, nausea and vomiting.  Musculoskeletal: Positive for arthralgias, back pain and myalgias. Negative for  gait problem, joint swelling, neck pain and neck stiffness.  Skin: Negative.  Negative for color change and pallor.  Neurological: Negative.  Negative for dizziness, weakness, light-headedness and headaches.  Hematological: Negative.  Negative for adenopathy.  Psychiatric/Behavioral: Negative.  Negative for confusion. The patient is not nervous/anxious.    Per HPI unless specifically indicated above     Objective:    BP 128/86 (BP Location: Left Arm, Patient Position: Sitting, Cuff Size: Normal)   Pulse 92   Temp 98.1 F (36.7 C) (Oral)   Ht 6' (1.829 m)   Wt 245 lb 6.4 oz (111.3 kg)   SpO2 99%   BMI 33.28 kg/m   Wt Readings from Last 3 Encounters:  11/08/19 245 lb 6.4 oz (111.3 kg)  11/04/19 240 lb (108.9 kg)  07/06/19 248 lb 3.8 oz (112.6 kg)    Physical Exam Vitals and nursing note reviewed.  Constitutional:       Appearance: He is well-developed and normal weight. He is not toxic-appearing.  HENT:     Head: Normocephalic and atraumatic.  Eyes:     Extraocular Movements: Extraocular movements intact.     Pupils: Pupils are equal, round, and reactive to light.  Cardiovascular:     Rate and Rhythm: Normal rate and regular rhythm.     Heart sounds: Normal heart sounds. No murmur.  Pulmonary:     Effort: Pulmonary effort is normal. No tachypnea or respiratory distress.     Breath sounds: Normal breath sounds.  Abdominal:     General: Abdomen is flat. Bowel sounds are normal. There is no distension.  Musculoskeletal:        General: Normal range of motion.     Cervical back: Normal range of motion.     Right lower leg: No edema.     Left lower leg: No edema.  Lymphadenopathy:     Cervical: No cervical adenopathy.  Skin:    General: Skin is warm and dry.     Capillary Refill: Capillary refill takes less than 2 seconds.     Coloration: Skin is not cyanotic or pale.  Neurological:     General: No focal deficit present.     Mental Status: He is alert and oriented to person, place, and time.     Motor: No weakness.     Gait: Gait normal.  Psychiatric:        Mood and Affect: Mood is not anxious.        Behavior: Behavior is agitated.        Thought Content: Thought content normal.        Judgment: Judgment normal.     Results for orders placed or performed during the hospital encounter of 11/04/19  Basic metabolic panel  Result Value Ref Range   Sodium 139 135 - 145 mmol/L   Potassium 4.4 3.5 - 5.1 mmol/L   Chloride 110 98 - 111 mmol/L   CO2 22 22 - 32 mmol/L   Glucose, Bld 217 (H) 70 - 99 mg/dL   BUN 13 6 - 20 mg/dL   Creatinine, Ser 2.77 0.61 - 1.24 mg/dL   Calcium 8.3 (L) 8.9 - 10.3 mg/dL   GFR calc non Af Amer >60 >60 mL/min   GFR calc Af Amer >60 >60 mL/min   Anion gap 7 5 - 15  CBC  Result Value Ref Range   WBC 4.4 4.0 - 10.5 K/uL   RBC 4.96 4.22 - 5.81 MIL/uL   Hemoglobin  16.3  13.0 - 17.0 g/dL   HCT 40.1 02.7 - 25.3 %   MCV 95.2 80.0 - 100.0 fL   MCH 32.9 26.0 - 34.0 pg   MCHC 34.5 30.0 - 36.0 g/dL   RDW 66.4 40.3 - 47.4 %   Platelets 237 150 - 400 K/uL   nRBC 0.0 0.0 - 0.2 %  Troponin I (High Sensitivity)  Result Value Ref Range   Troponin I (High Sensitivity) 5 <18 ng/L      Assessment & Plan:   Problem List Items Addressed This Visit      Cardiovascular and Mediastinum   Hypertension    Chronic, at goal today in office.  Per recent home readings, BP has been elevated.  Instructed on dietary and lifestyle and continue to monitor at home after pain under better control.  Call or return to clinic if BP remains > 140/90.  CBC, CMP, TSH, lipids, and A1c checked today.        Endocrine   Type 2 diabetes mellitus with diabetic neuropathy, unspecified (HCC)    Chronic, ongoing.  Patient reports good control of blood sugars at home with current regimen, continue current regimen for now.  CBC, CMP, TSH, lipids, and A1c checked today.      Relevant Orders   Bayer DCA Hb A1c Waived   Lipid Panel w/o Chol/HDL Ratio   TSH   CBC with Differential/Platelet   Comprehensive metabolic panel     Other   Hyperlipidemia    Chronic, ongoing.  Continue statin.  CBC, CMP, TSH, lipids, and A1c checked today.      Morbid obesity (HCC)    Discussed dietary and lifestyle changes.  Continue to work on these.       Relevant Orders   Bayer DCA Hb A1c Waived   Lipid Panel w/o Chol/HDL Ratio   CBC with Differential/Platelet   Comprehensive metabolic panel   Chest pain - Primary    Acute, ongoing for the past month.  Went to ER about 5 days ago and left after triage, without being seen.  Discussed high risk of cardiovascular event with risk factors including diabetes and high cholesterol.  Patient adamantly refusing to return to the ER today due to the 8 hour wait he had previously.  EKG performed, looks comparable to most recent EKG.  CBC, CMP, TSH, lipids, and A1c  checked.  Pain reciprocated on palpation, will treat with muscle relaxant and place urgent referral to Cardiology for further workup.      Relevant Orders   EKG 12-Lead   TSH   CBC with Differential/Platelet   Comprehensive metabolic panel   Ambulatory referral to Cardiology   EKG 12-Lead (Completed)       Follow up plan: Return in about 3 months (around 02/08/2020) for HTN/HLD, DM follow up.

## 2019-11-09 LAB — CBC WITH DIFFERENTIAL/PLATELET
Basophils Absolute: 0.1 10*3/uL (ref 0.0–0.2)
Basos: 1 %
EOS (ABSOLUTE): 0.4 10*3/uL (ref 0.0–0.4)
Eos: 7 %
Hematocrit: 49.9 % (ref 37.5–51.0)
Hemoglobin: 17 g/dL (ref 13.0–17.7)
Immature Grans (Abs): 0 10*3/uL (ref 0.0–0.1)
Immature Granulocytes: 1 %
Lymphocytes Absolute: 1.8 10*3/uL (ref 0.7–3.1)
Lymphs: 34 %
MCH: 33 pg (ref 26.6–33.0)
MCHC: 34.1 g/dL (ref 31.5–35.7)
MCV: 97 fL (ref 79–97)
Monocytes Absolute: 0.4 10*3/uL (ref 0.1–0.9)
Monocytes: 8 %
Neutrophils Absolute: 2.5 10*3/uL (ref 1.4–7.0)
Neutrophils: 49 %
Platelets: 258 10*3/uL (ref 150–450)
RBC: 5.15 x10E6/uL (ref 4.14–5.80)
RDW: 12.9 % (ref 11.6–15.4)
WBC: 5.1 10*3/uL (ref 3.4–10.8)

## 2019-11-09 LAB — COMPREHENSIVE METABOLIC PANEL
ALT: 23 IU/L (ref 0–44)
AST: 17 IU/L (ref 0–40)
Albumin/Globulin Ratio: 2 (ref 1.2–2.2)
Albumin: 4 g/dL (ref 3.8–4.9)
Alkaline Phosphatase: 60 IU/L (ref 48–121)
BUN/Creatinine Ratio: 11 (ref 9–20)
BUN: 14 mg/dL (ref 6–24)
Bilirubin Total: <0.2 mg/dL (ref 0.0–1.2)
CO2: 19 mmol/L — ABNORMAL LOW (ref 20–29)
Calcium: 8.5 mg/dL — ABNORMAL LOW (ref 8.7–10.2)
Chloride: 108 mmol/L — ABNORMAL HIGH (ref 96–106)
Creatinine, Ser: 1.23 mg/dL (ref 0.76–1.27)
GFR calc Af Amer: 76 mL/min/{1.73_m2} (ref >59)
GFR calc non Af Amer: 66 mL/min/{1.73_m2} (ref >59)
Globulin, Total: 2 g/dL (ref 1.5–4.5)
Glucose: 130 mg/dL — ABNORMAL HIGH (ref 65–99)
Potassium: 4.7 mmol/L (ref 3.5–5.2)
Sodium: 139 mmol/L (ref 134–144)
Total Protein: 6 g/dL (ref 6.0–8.5)

## 2019-11-09 LAB — LIPID PANEL W/O CHOL/HDL RATIO
Cholesterol, Total: 149 mg/dL (ref 100–199)
HDL: 44 mg/dL (ref >39)
LDL Chol Calc (NIH): 71 mg/dL (ref 0–99)
Triglycerides: 203 mg/dL — ABNORMAL HIGH (ref 0–149)
VLDL Cholesterol Cal: 34 mg/dL (ref 5–40)

## 2019-11-09 LAB — TSH: TSH: 0.977 u[IU]/mL (ref 0.450–4.500)

## 2019-11-10 ENCOUNTER — Encounter: Payer: Self-pay | Admitting: Podiatry

## 2019-11-10 ENCOUNTER — Ambulatory Visit: Payer: Medicaid Other | Admitting: Podiatry

## 2019-11-10 ENCOUNTER — Other Ambulatory Visit: Payer: Self-pay

## 2019-11-10 DIAGNOSIS — B351 Tinea unguium: Secondary | ICD-10-CM

## 2019-11-10 DIAGNOSIS — M79674 Pain in right toe(s): Secondary | ICD-10-CM | POA: Diagnosis not present

## 2019-11-10 DIAGNOSIS — E1140 Type 2 diabetes mellitus with diabetic neuropathy, unspecified: Secondary | ICD-10-CM | POA: Diagnosis not present

## 2019-11-10 DIAGNOSIS — M79675 Pain in left toe(s): Secondary | ICD-10-CM

## 2019-11-10 NOTE — Progress Notes (Signed)
Complaint:  Visit Type: Patient returns to my office for continued preventative foot care services. Complaint: Patient states" my nails have grown long and thick and become painful to walk and wear shoes" Patient has been diagnosed with DM with no foot complications. The patient presents for preventative foot care services.  Podiatric Exam: Vascular: dorsalis pedis and posterior tibial pulses are palpable bilateral. Capillary return is immediate. Temperature gradient is WNL. Skin turgor WNL  Sensorium: Normal Semmes Weinstein monofilament test. Normal tactile sensation bilaterally. Nail Exam: Pt has thick disfigured discolored nails with subungual debris noted bilateral entire nail hallux through fifth toenails Ulcer Exam: There is no evidence of ulcer or pre-ulcerative changes or infection. Orthopedic Exam: Muscle tone and strength are WNL. No limitations in general ROM. No crepitus or effusions noted. Foot type and digits show no abnormalities. Bony prominences are unremarkable. Skin: No Porokeratosis. No infection or ulcers  Diagnosis:  Onychomycosis, , Pain in right toe, pain in left toes  Treatment & Plan Procedures and Treatment: Consent by patient was obtained for treatment procedures.   Debridement of mycotic and hypertrophic toenails, 1 through 5 bilateral and clearing of subungual debris. No ulceration, no infection noted.  Return Visit-Office Procedure: Patient instructed to return to the office for a follow up visit 3 months for continued evaluation and treatment.    Haruna Rohlfs DPM 

## 2019-11-14 ENCOUNTER — Ambulatory Visit: Payer: Medicaid Other | Admitting: Cardiology

## 2019-11-15 ENCOUNTER — Encounter: Payer: Self-pay | Admitting: Cardiology

## 2019-11-24 ENCOUNTER — Encounter: Payer: Self-pay | Admitting: *Deleted

## 2019-11-25 ENCOUNTER — Ambulatory Visit (INDEPENDENT_AMBULATORY_CARE_PROVIDER_SITE_OTHER): Payer: Medicaid Other | Admitting: Cardiology

## 2019-11-25 ENCOUNTER — Encounter: Payer: Self-pay | Admitting: Cardiology

## 2019-11-25 ENCOUNTER — Other Ambulatory Visit: Payer: Self-pay

## 2019-11-25 VITALS — BP 110/80 | HR 67 | Ht 72.0 in | Wt 243.1 lb

## 2019-11-25 DIAGNOSIS — I10 Essential (primary) hypertension: Secondary | ICD-10-CM | POA: Diagnosis not present

## 2019-11-25 DIAGNOSIS — F17200 Nicotine dependence, unspecified, uncomplicated: Secondary | ICD-10-CM | POA: Diagnosis not present

## 2019-11-25 DIAGNOSIS — R079 Chest pain, unspecified: Secondary | ICD-10-CM

## 2019-11-25 NOTE — Progress Notes (Signed)
Cardiology Office Note:    Date:  11/25/2019   ID:  Lance Vaughan, DOB [birth year], MRN [id number]  PCP:  Steele Sizer, MD  [facility] Cardiologist:  Debbe Odea, MD  [facility] Electrophysiologist:  None   Referring MD: Wells Guiles, NP   Chief Complaint  Patient presents with  . New Patient (Initial Visit)    Ref by Dr. Vonita Moss for chest pain. Pt. c/o sharp, shooting chest pains that radiate through to his back; symptoms for two months. Pt. c/o chest pain when he first came into the exam room with on a scale from 1-10 beng a 10 and now at 8:50 am feels more like a 7 or 8.     History of Present Illness:    Lance Vaughan is a 54 y.o. male with a hx of hypertension, current smoker x30 years hyperlipidemia, diabetes, bipolar disorder who presents due to chest pain.  Patient states chest discomfort has been ongoing for about 3 months now.  Chest pain is 10 out of 10 in severity, occurs every day.  Chest pain is made worse with pressing on his chest and made better when he takes ibuprofen and Tylenol.  Symptoms are not related with exertion, he denies edema, orthopnea, palpitations.  He denies any other cardiac symptoms.   Past Medical History:  Diagnosis Date  . Arthritis   . Bipolar disorder (HCC)   . Bursitis   . Depression   . Diabetes mellitus without complication (HCC)   . Diverticulitis   . Hyperlipidemia   . Hypertension   . Psychosis (HCC)   . Psychosis [facility])     Past Surgical History:  Procedure Laterality Date  . COLONOSCOPY WITH PROPOFOL N/A 05/03/2018   Procedure: COLONOSCOPY WITH PROPOFOL;  Surgeon: Toney Reil, MD;  Location: [facility] ENDOSCOPY;  Service: Gastroenterology;  Laterality: N/A;  . HEMORRHOID SURGERY N/A 07/06/2019   Procedure: HEMORRHOIDECTOMY;  Surgeon: [name], MD;  Location: [facility] ORS;  Service: General;  Laterality: N/A;  . RECTAL EXAM UNDER ANESTHESIA N/A 07/06/2019   Procedure: RECTAL EXAM UNDER ANESTHESIA;   Surgeon: [name], MD;  Location: [facility] ORS;  Service: General;  Laterality: N/A;    Current Medications: Current Meds  Medication Sig  . ACCU-CHEK AVIVA PLUS test strip TEST TWICE DAILY  . amLODipine (NORVASC) 10 MG tablet Take 1 tablet (10 mg total) by mouth daily.  Marland Kitchen aspirin EC 81 MG tablet Take 1 tablet (81 mg total) by mouth daily.  Marland Kitchen atorvastatin (LIPITOR) 40 MG tablet Take 1 tablet (40 mg total) by mouth daily.  . dapagliflozin propanediol (FARXIGA) 5 MG TABS tablet Take 5 mg by mouth daily.  Marland Kitchen HYDROcodone-acetaminophen (NORCO/VICODIN) 5-325 MG tablet Take 1 tablet by mouth every 6 (six) hours as needed for moderate pain.  Marland Kitchen ibuprofen (ADVIL) 800 MG tablet Take 1 tablet (800 mg total) by mouth every 8 (eight) hours as needed.  . insulin glulisine (APIDRA) 100 UNIT/ML injection Inject 0.08 mLs (8 Units total) into the skin 2 (two) times daily after a meal. Before dinner (Patient taking differently: Inject 10 Units into the skin daily with supper. )  . metoprolol succinate (TOPROL-XL) 25 MG 24 hr tablet Take 1 tablet (25 mg total) by mouth daily.  . Semaglutide,0.25 or 0.5MG /DOS, (OZEMPIC, 0.25 OR 0.5 MG/DOSE,) 2 MG/1.5ML SOPN Inject 0.25 mg into the skin once a week. (Patient taking differently: Inject 0.5 mg into the skin every Sunday. )  . ULTRACARE PEN NEEDLES 32G X 4  MM MISC USE 3 TIMES DAILY AS NEEDED     Allergies:   Lisinopril   Social History   Socioeconomic History  . Marital status: Single    Spouse name: Not on file  . Number of children: 4  . Years of education: Not on file  . Highest education level: GED or equivalent  Occupational History  . Occupation: disability  Tobacco Use  . Smoking status: Current Every Day Smoker    Packs/day: 0.50    Years: 30.00    Pack years: 15.00    Types: Cigarettes  . Smokeless tobacco: Never Used  Substance and Sexual Activity  . Alcohol use: No  . Drug use: No  . Sexual activity: Yes  Other Topics Concern  . Not on  file  Social History Narrative   Patient is currently living with his ex-wife, but is looking for other housing options.  He gets food when he has a ride to the foodbank.   Social Determinants of Health   Financial Resource Strain:   . Difficulty of Paying Living Expenses:   Food Insecurity:   . Worried About Programme researcher, broadcasting/film/video in the Last Year:   . Barista in the Last Year:   Transportation Needs:   . Freight forwarder (Medical):   Marland Kitchen Lack of Transportation (Non-Medical):   Physical Activity:   . Days of Exercise per Week:   . Minutes of Exercise per Session:   Stress:   . Feeling of Stress :   Social Connections:   . Frequency of Communication with Friends and Family:   . Frequency of Social Gatherings with Friends and Family:   . Attends Religious Services:   . Active Member of Clubs or Organizations:   . Attends Banker Meetings:   Marland Kitchen Marital Status:      Family History: The patient's family history includes Diabetes in his maternal grandmother; Hyperlipidemia in his sister and sister; Hypertension in his mother, sister, and sister.  ROS:   Please see the history of present illness.     All other systems reviewed and are negative.  EKGs/Labs/Other Studies Reviewed:    The following studies were reviewed today:   EKG:  EKG is  ordered today.  The ekg ordered today demonstrates normal sinus rhythm, nonspecific T wave abnormality  Recent Labs: 11/08/2019: ALT 23; BUN 14; Creatinine, Ser 1.23; Hemoglobin 17.0; Platelets 258; Potassium 4.7; Sodium 139; TSH 0.977  Recent Lipid Panel    Component Value Date/Time   CHOL 149 11/08/2019 1416   TRIG 203 (H) 11/08/2019 1416   HDL 44 11/08/2019 1416   CHOLHDL 5.8 (H) 12/31/2017 1826   LDLCALC 71 11/08/2019 1416    Physical Exam:    VS:  BP 110/80 (BP Location: Right Arm, Patient Position: Sitting, Cuff Size: Normal)   Pulse 67   Ht 6' (1.829 m)   Wt 243 lb 2 oz (110.3 kg)   SpO2 98%   BMI  32.97 kg/m     Wt Readings from Last 3 Encounters:  11/25/19 243 lb 2 oz (110.3 kg)  11/08/19 245 lb 6.4 oz (111.3 kg)  11/04/19 240 lb (108.9 kg)     GEN:  Well nourished, well developed in no acute distress HEENT: Normal NECK: No JVD; No carotid bruits LYMPHATICS: No lymphadenopathy CARDIAC: RRR, no murmurs, rubs, gallops RESPIRATORY:  Clear to auscultation without rales, wheezing or rhonchi  ABDOMEN: Soft, non-tender, non-distended MUSCULOSKELETAL:  No edema; left chest tenderness  noted with palpation. SKIN: Warm and dry NEUROLOGIC:  Alert and oriented x 3 PSYCHIATRIC:  Normal affect   ASSESSMENT:    1. Chest pain of uncertain etiology   2. Essential hypertension   3. Smoking    PLAN:    In order of problems listed above:  1. Patient presenting with 8-month history of chest pain.  Chest discomfort is reproducible with palpation.  Chest pain not of cardiac origin but more musculoskeletal in etiology.  Recommend NSAIDs, follow-up with PCP for additional management/steroid injection consideration for possible costochondritis.  Patient educated on cardiac etiology of chest pain and presentation.  May follow-up if he develops this in the future. 2. History of hypertension, blood pressure well controlled.  Continue current BP meds. 3. Patient is a current smoker x30+ years.  Smoking cessation advised.  Follow-up in 6 months or as needed.  This note was generated in part or whole with voice recognition software. Voice recognition is usually quite accurate but there are transcription errors that can and very often do occur. I apologize for any typographical errors that were not detected and corrected.  Medication Adjustments/Labs and Tests Ordered: Current medicines are reviewed at length with the patient today.  Concerns regarding medicines are outlined above.  Orders Placed This Encounter  Procedures  . EKG 12-Lead   No orders of the defined types were placed in this  encounter.   Patient Instructions  Medication Instructions:  Your physician recommends that you continue on your current medications as directed. Please refer to the Current Medication list given to you today.  *If you need a refill on your cardiac medications before your next appointment, please call your pharmacy*   Lab Work: None ordered If you have labs (blood work) drawn today and your tests are completely normal, you will receive your results only by: Marland Kitchen MyChart Message (if you have MyChart) OR . A paper copy in the mail If you have any lab test that is abnormal or we need to change your treatment, we will call you to review the results.   Testing/Procedures: None ordred   Follow-Up: At [facility], you and your health needs are our priority.  As part of our continuing mission to provide you with exceptional heart care, we have created designated Provider Care Teams.  These Care Teams include your primary Cardiologist (physician) and Advanced Practice Providers (APPs -  Physician Assistants and Nurse Practitioners) who all work together to provide you with the care you need, when you need it.  We recommend signing up for the patient portal called "MyChart".  Sign up information is provided on this After Visit Summary.  MyChart is used to connect with patients for Virtual Visits (Telemedicine).  Patients are able to view lab/test results, encounter notes, upcoming appointments, etc.  Non-urgent messages can be sent to your provider as well.   To learn more about what you can do with MyChart, go to [email or web address].    Your next appointment:   6 month(s)  The format for your next appointment:   In Person  Provider:    You may see Kate Sable, MD or one of the following Advanced Practice Providers on your designated Care Team:    Murray Hodgkins, NP  Christell Faith, PA-C  Marrianne Mood, PA-C    Other Instructions N/A     Signed, Kate Sable,  MD  11/25/2019 12:49 PM    [place]

## 2019-11-25 NOTE — Patient Instructions (Signed)
Medication Instructions:  Your physician recommends that you continue on your current medications as directed. Please refer to the Current Medication list given to you today.  *If you need a refill on your cardiac medications before your next appointment, please call your pharmacy*   Lab Work: None ordered If you have labs (blood work) drawn today and your tests are completely normal, you will receive your results only by:  MyChart Message (if you have MyChart) OR  A paper copy in the mail If you have any lab test that is abnormal or we need to change your treatment, we will call you to review the results.   Testing/Procedures: None ordred   Follow-Up: At [facility], you and your health needs are our priority.  As part of our continuing mission to provide you with exceptional heart care, we have created designated Provider Care Teams.  These Care Teams include your primary Cardiologist (physician) and Advanced Practice Providers (APPs -  Physician Assistants and Nurse Practitioners) who all work together to provide you with the care you need, when you need it.  We recommend signing up for the patient portal called "MyChart".  Sign up information is provided on this After Visit Summary.  MyChart is used to connect with patients for Virtual Visits (Telemedicine).  Patients are able to view lab/test results, encounter notes, upcoming appointments, etc.  Non-urgent messages can be sent to your provider as well.   To learn more about what you can do with MyChart, go to [email or web address].    Your next appointment:   6 month(s)  The format for your next appointment:   In Person  Provider:    You may see Debbe Odea, MD or one of the following Advanced Practice Providers on your designated Care Team:    Nicolasa Ducking, NP  Eula Listen, PA-C  Marisue Ivan, PA-C    Other Instructions N/A

## 2019-11-30 ENCOUNTER — Telehealth: Payer: Self-pay | Admitting: Nurse Practitioner

## 2019-11-30 NOTE — Telephone Encounter (Signed)
Copied from CRM [id number]. Topic: General - Other >> Nov 30, 2019  1:09 PM Tamela Oddi wrote: Reason for CRM: Patient is calling to switch his referral from [facility] to [facility] in [place].  He stated that the eye specialist is [place] is too far for him.  Please let patient know if this referral can be changed.  CB# [phone]

## 2019-12-01 NOTE — Telephone Encounter (Signed)
Sent referral to [facility] eye care. Left a voicemail for patient to let him know.

## 2019-12-23 ENCOUNTER — Other Ambulatory Visit: Payer: Self-pay | Admitting: Family Medicine

## 2020-01-27 ENCOUNTER — Other Ambulatory Visit: Payer: Self-pay | Admitting: Family Medicine

## 2020-01-27 NOTE — Telephone Encounter (Signed)
Requested medication (s) are due for refill today - yes if to continue  Requested medication (s) are on the active medication list -no  Future visit scheduled -yes  Last refill: 12/23/19  Notes to clinic: Medication is not on current medication list- but has been filled recently- sent for review of request  Requested Prescriptions  Pending Prescriptions Disp Refills   gabapentin (NEURONTIN) 600 MG tablet [Pharmacy Med Name: GABAPENTIN 600 MG TAB] 90 tablet     Sig: TAKE 1 TABLET BY MOUTH 3 TIMES DAILY AS NEEDED      Neurology: Anticonvulsants - gabapentin Passed - 01/27/2020  9:50 AM      Passed - Valid encounter within last 12 months    Recent Outpatient Visits           2 months ago Chest pain, unspecified type   [facility] Mardene Celeste I, NP   8 months ago Hemorrhoids, unspecified hemorrhoid type   [facility], [place], [place]   11 months ago Type 2 diabetes mellitus with diabetic neuropathy, with long-term current use of insulin [facility])   [facility] [place], [place], [place]   1 year ago Essential hypertension   [facility] Crissman, Redge Gainer, MD   1 year ago Type 2 diabetes mellitus with diabetic neuropathy, with long-term current use of insulin (HCC)   [facility] [place], [place], [place]       Future Appointments             In 1 week Denzil Magnuson, Jodelle Gross, NP [facility]                Requested Prescriptions  Pending Prescriptions Disp Refills   gabapentin (NEURONTIN) 600 MG tablet [Pharmacy Med Name: GABAPENTIN 600 MG TAB] 90 tablet     Sig: TAKE 1 TABLET BY MOUTH 3 TIMES DAILY AS NEEDED      Neurology: Anticonvulsants - gabapentin Passed - 01/27/2020  9:50 AM      Passed - Valid encounter within last 12 months    Recent Outpatient Visits           2 months ago Chest pain, unspecified type   [facility] Mardene Celeste I, NP   8 months ago  Hemorrhoids, unspecified hemorrhoid type   [facility], [place], [place]   11 months ago Type 2 diabetes mellitus with diabetic neuropathy, with long-term current use of insulin [facility])   [facility] [place], [place], [place]   1 year ago Essential hypertension   [facility] Crissman, Redge Gainer, MD   1 year ago Type 2 diabetes mellitus with diabetic neuropathy, with long-term current use of insulin [facility])   [facility] [place], [place], [place]       Future Appointments             In 1 week Denzil Magnuson, Jodelle Gross, NP [facility]

## 2020-01-27 NOTE — Telephone Encounter (Signed)
LOV 11/08/19  Medication is not on current medication list

## 2020-01-30 ENCOUNTER — Other Ambulatory Visit: Payer: Self-pay

## 2020-01-30 MED ORDER — METOPROLOL SUCCINATE ER 25 MG PO TB24: 25.0000 mg | ORAL_TABLET | Freq: Every day | ORAL | 4 refills | Status: DC

## 2020-01-30 MED ORDER — DAPAGLIFLOZIN PROPANEDIOL 5 MG PO TABS: 5.0000 mg | ORAL_TABLET | Freq: Every day | ORAL | 4 refills | Status: DC

## 2020-01-30 MED ORDER — ATORVASTATIN CALCIUM 40 MG PO TABS: 40.0000 mg | ORAL_TABLET | Freq: Every day | ORAL | 4 refills | Status: DC

## 2020-01-30 MED ORDER — AMLODIPINE BESYLATE 10 MG PO TABS: 10.0000 mg | ORAL_TABLET | Freq: Every day | ORAL | 4 refills | Status: DC

## 2020-01-30 NOTE — Telephone Encounter (Signed)
Patient last seen 11/08/19, last written by Dr. Dossie Arbour.

## 2020-02-07 ENCOUNTER — Ambulatory Visit: Payer: Medicaid Other | Admitting: Nurse Practitioner

## 2020-02-13 ENCOUNTER — Ambulatory Visit: Payer: Medicaid Other | Admitting: Podiatry

## 2020-02-14 ENCOUNTER — Ambulatory Visit: Payer: Medicaid Other | Admitting: Nurse Practitioner

## 2020-02-14 ENCOUNTER — Other Ambulatory Visit: Payer: Self-pay

## 2020-02-14 ENCOUNTER — Ambulatory Visit (INDEPENDENT_AMBULATORY_CARE_PROVIDER_SITE_OTHER): Payer: Medicaid Other | Admitting: Nurse Practitioner

## 2020-02-14 ENCOUNTER — Encounter: Payer: Self-pay | Admitting: Nurse Practitioner

## 2020-02-14 VITALS — BP 127/90 | HR 79 | Temp 98.6°F | Wt 236.4 lb

## 2020-02-14 DIAGNOSIS — I10 Essential (primary) hypertension: Secondary | ICD-10-CM | POA: Diagnosis not present

## 2020-02-14 DIAGNOSIS — E1140 Type 2 diabetes mellitus with diabetic neuropathy, unspecified: Secondary | ICD-10-CM | POA: Diagnosis not present

## 2020-02-14 DIAGNOSIS — R7989 Other specified abnormal findings of blood chemistry: Secondary | ICD-10-CM | POA: Diagnosis not present

## 2020-02-14 DIAGNOSIS — R252 Cramp and spasm: Secondary | ICD-10-CM

## 2020-02-14 DIAGNOSIS — Z794 Long term (current) use of insulin: Secondary | ICD-10-CM

## 2020-02-14 LAB — MICROALBUMIN, URINE WAIVED
Creatinine, Urine Waived: 200 mg/dL (ref 10–300)
Microalb, Ur Waived: 30 mg/L — ABNORMAL HIGH (ref 0–19)
Microalb/Creat Ratio: <30 mg/g (ref <30)

## 2020-02-14 NOTE — Assessment & Plan Note (Addendum)
Chronic, stable according to home readings.  Last A1c 5.7%, will recheck today.  Continue to watch carbohydrates and take prescribed daily medication for diabetes.  Plans to get in with eye doctor in October.  Declines foot examination today, is going to get in with Podiatrist next month.  Follow up in 3 months.

## 2020-02-14 NOTE — Assessment & Plan Note (Addendum)
Chronic, stable.  BP appears to be well-controlled at home.  Continue DASH diet and lifestyle changes, continue to monitor BP at home.  CMP, urine microalbumin checked today.  Follow up in 3 months.

## 2020-02-14 NOTE — Patient Instructions (Signed)

## 2020-02-14 NOTE — Assessment & Plan Note (Signed)
Acute, ongoing x 20 days.  Differentials include electrolyte imbalance, dehydration, claudication, or blood clot.  Will check electrolytes and kidney function today.  Encouraged increasingly water intake drastically.  If no improvement, may need to consider work up for claudication.

## 2020-02-14 NOTE — Progress Notes (Signed)
BP 127/90 (BP Location: Left Arm, Patient Position: Sitting, Cuff Size: Normal)   Pulse 79   Temp 98.6 F (37 C) (Oral)   Wt 236 lb 6.4 oz (107.2 kg)   SpO2 100%   BMI 32.06 kg/m    Subjective:    Patient ID: Lance Vaughan, male    DOB: April 17, 1966, 54 y.o.   MRN: [id number]  HPI: Lance Vaughan is a 54 y.o. male presenting for follow up.  Chief Complaint  Patient presents with  . Hyperlipidemia  . Hypertension  . Diabetes  . Leg Problem    right leg cramp   HYPERTENSION / HYPERLIPIDEMIA Currently takes amlodipine 10 mg, metoprolol 25 mg, atorvastatin 40 mg,  Satisfied with current treatment? yes Duration of hypertension: chronic BP monitoring frequency: weekly - a couple of times per week BP range: 120s/70s BP medication side effects: no Past BP meds: amlodipine, metoprolol Duration of hyperlipidemia: chronic Cholesterol medication side effects: no Cholesterol supplements: none Past cholesterol medications: atorvastatin Medication compliance: excellent compliance Aspirin: yes Recent stressors: no Recurrent headaches: no Visual changes: no Palpitations: no Dyspnea: no Chest pain: no Lower extremity edema: no Dizzy/lightheaded: no  DIABETES Currently taking Farxiga 5 mg, gabapentin 600 mg, apidra 10 units with supper, ozempic 0.5 mg weekly Hypoglycemic episodes:no Polydipsia/polyuria: no Visual disturbance: no Chest pain: no Paresthesias: no Glucose Monitoring: yes  Accucheck frequency: Daily  Fasting glucose: 115-120 Taking Insulin?: yes  Long acting insulin: 10 units; only takes sometimes if blood sugar is more than 150  Blood Pressure Monitoring: weekly Retinal Examination: Not up to Date Foot Exam: declines - going to see the Podiatrist soon Diabetic Education: Completed Pneumovax: Up to Date Influenza: deferred to flu season Aspirin: yes  LEG CRAMPS Patent reports cramp in back of right calf that has been going on for about 20 days.  It comes on  every day at least once per day. He was walking when he first noticed it.  He rubbed it and stopped walking and it went away. Reports sometimes, the cramping comes on when standing or sitting.  Reports not drinking that much water throughout the day. Duration: days Pain: yes Severity: 7/10  Quality:  cramping Location:  right calf up to knee Bilateral:  no Onset: sudden Frequency: intermittent, daily Time of  day:   at random Sudden unintentional leg jerking:   no Paresthesias:   no Decreased sensation:  no Weakness:   no Insomnia:   no Fatigue:   no  Alleviating factors:  Massage, rest Aggravating factors: nothing Status: worse Treatments attempted: nothing tried  Allergies  Allergen Reactions  . Lisinopril Other (See Comments)    Chest pain   Outpatient Encounter Medications as of 02/14/2020  Medication Sig  . ACCU-CHEK AVIVA PLUS test strip TEST TWICE DAILY  . amLODipine (NORVASC) 10 MG tablet Take 1 tablet (10 mg total) by mouth daily.  Marland Kitchen aspirin EC 81 MG tablet Take 1 tablet (81 mg total) by mouth daily.  Marland Kitchen atorvastatin (LIPITOR) 40 MG tablet Take 1 tablet (40 mg total) by mouth daily.  . dapagliflozin propanediol (FARXIGA) 5 MG TABS tablet Take 1 tablet (5 mg total) by mouth daily.  Marland Kitchen gabapentin (NEURONTIN) 600 MG tablet TAKE 1 TABLET BY MOUTH 3 TIMES DAILY AS NEEDED  . HYDROcodone-acetaminophen (NORCO/VICODIN) 5-325 MG tablet Take 1 tablet by mouth every 6 (six) hours as needed for moderate pain.  Marland Kitchen ibuprofen (ADVIL) 800 MG tablet Take 1 tablet (800 mg total) by mouth every 8 (eight)  hours as needed.  . insulin glulisine (APIDRA) 100 UNIT/ML injection Inject 0.08 mLs (8 Units total) into the skin 2 (two) times daily after a meal. Before dinner (Patient taking differently: Inject 10 Units into the skin daily with supper. )  . metoprolol succinate (TOPROL-XL) 25 MG 24 hr tablet Take 1 tablet (25 mg total) by mouth daily.  . Semaglutide,0.25 or 0.5MG /DOS, (OZEMPIC, 0.25 OR  0.5 MG/DOSE,) 2 MG/1.5ML SOPN Inject 0.25 mg into the skin once a week. (Patient taking differently: Inject 0.5 mg into the skin every Sunday. )  . ULTRACARE PEN NEEDLES 32G X 4 MM MISC USE 3 TIMES DAILY AS NEEDED   No facility-administered encounter medications on file as of 02/14/2020.   Patient Active Problem List   Diagnosis Date Noted  . Leg cramp 02/14/2020  . Chest pain 11/08/2019  . Perianal venous thrombosis 06/30/2019  . 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 Knee effusion, left 01/20/2019  . Rectal bleeding   . Major depression, recurrent (HCC) 02/25/2018  . Bipolar disorder (HCC) 02/25/2018  . Tobacco use disorder 02/25/2018  . Morbid obesity (HCC) 02/25/2018  . Erectile dysfunction 02/25/2018  . Constipation 02/25/2018  . Bloody stool 02/25/2018  . Hyperlipidemia 08/20/2017  . Type 2 diabetes mellitus with diabetic neuropathy, unspecified (HCC) 08/20/2017  . Hypertension 08/20/2017  . Pneumococcal vaccine refused 12/18/2015  . Diverticulosis of colon 07/26/2015   Past Medical History:  Diagnosis Date  . Arthritis   . Bipolar disorder (HCC)   . Bursitis   . Depression   . Diabetes mellitus without complication (HCC)   . Diverticulitis   . Hyperlipidemia   . Hypertension   . Psychosis (HCC)   . Psychosis (HCC)    Relevant past medical, surgical, family and social history reviewed and updated as indicated. Interim medical history since our last visit reviewed.  Review of Systems  Constitutional: Negative.  Negative for activity change, appetite change, fatigue and fever.  Eyes: Negative.  Negative for visual disturbance.  Respiratory: Negative.  Negative for cough, chest tightness, shortness of breath and wheezing.   Cardiovascular: Negative.  Negative for chest pain, palpitations and leg swelling.  Gastrointestinal: Negative.  Negative for diarrhea, nausea and vomiting.  Musculoskeletal: Negative for arthralgias, back pain and joint swelling.       +  right calf cramping  Skin: Negative.  Negative for rash.  Neurological: Negative.   Psychiatric/Behavioral: Negative.    Per HPI unless specifically indicated above    Objective:    BP 127/90 (BP Location: Left Arm, Patient Position: Sitting, Cuff Size: Normal)   Pulse 79   Temp 98.6 F (37 C) (Oral)   Wt 236 lb 6.4 oz (107.2 kg)   SpO2 100%   BMI 32.06 kg/m   Wt Readings from Last 3 Encounters:  02/14/20 236 lb 6.4 oz (107.2 kg)  11/25/19 243 lb 2 oz (110.3 kg)  11/08/19 245 lb 6.4 oz (111.3 kg)    Physical Exam Vitals and nursing note reviewed.  Constitutional:      General: He is not in acute distress.    Appearance: Normal appearance. He is obese.  Eyes:     General: No scleral icterus.    Extraocular Movements: Extraocular movements intact.  Neck:     Vascular: No carotid bruit.  Cardiovascular:     Rate and Rhythm: Normal rate.     Heart sounds: Normal heart sounds. No murmur heard.   Pulmonary:     Effort: Pulmonary effort is  normal. No respiratory distress.     Breath sounds: Normal breath sounds. No wheezing or rhonchi.  Abdominal:     General: Abdomen is flat. Bowel sounds are normal. There is no distension.  Musculoskeletal:        General: Normal range of motion.     Cervical back: Normal range of motion.     Right lower leg: No edema.     Left lower leg: No edema.  Skin:    General: Skin is warm and dry.     Coloration: Skin is not jaundiced or pale.  Neurological:     General: No focal deficit present.     Mental Status: He is alert and oriented to person, place, and time.     Motor: No weakness.     Gait: Gait normal.  Psychiatric:        Mood and Affect: Mood normal.        Behavior: Behavior normal.        Thought Content: Thought content normal.        Judgment: Judgment normal.     Results for orders placed or performed in visit on 11/08/19  Bayer DCA Hb A1c Waived  Result Value Ref Range   HB A1C (BAYER DCA - WAIVED) 5.7 <7.0 %    Lipid Panel w/o Chol/HDL Ratio  Result Value Ref Range   Cholesterol, Total 149 100 - 199 mg/dL   Triglycerides 425 (H) 0 - 149 mg/dL   HDL 44 >95 mg/dL   VLDL Cholesterol Cal 34 5 - 40 mg/dL   LDL Chol Calc (NIH) 71 0 - 99 mg/dL  TSH  Result Value Ref Range   TSH 0.977 0.450 - 4.500 uIU/mL  CBC with Differential/Platelet  Result Value Ref Range   WBC 5.1 3.4 - 10.8 x10E3/uL   RBC 5.15 4.14 - 5.80 x10E6/uL   Hemoglobin 17.0 13.0 - 17.7 g/dL   Hematocrit 63.8 75.6 - 51.0 %   MCV 97 79 - 97 fL   MCH 33.0 26.6 - 33.0 pg   MCHC 34.1 31 - 35 g/dL   RDW 43.3 29.5 - 18.8 %   Platelets 258 150 - 450 x10E3/uL   Neutrophils 49 Not Estab. %   Lymphs 34 Not Estab. %   Monocytes 8 Not Estab. %   Eos 7 Not Estab. %   Basos 1 Not Estab. %   Neutrophils Absolute 2.5 1 - 7 x10E3/uL   Lymphocytes Absolute 1.8 0 - 3 x10E3/uL   Monocytes Absolute 0.4 0 - 0 x10E3/uL   EOS (ABSOLUTE) 0.4 0.0 - 0.4 x10E3/uL   Basophils Absolute 0.1 0 - 0 x10E3/uL   Immature Granulocytes 1 Not Estab. %   Immature Grans (Abs) 0.0 0.0 - 0.1 x10E3/uL  Comprehensive metabolic panel  Result Value Ref Range   Glucose 130 (H) 65 - 99 mg/dL   BUN 14 6 - 24 mg/dL   Creatinine, Ser 4.16 0.76 - 1.27 mg/dL   GFR calc non Af Amer 66 >59 mL/min/1.73   GFR calc Af Amer 76 >59 mL/min/1.73   BUN/Creatinine Ratio 11 9 - 20   Sodium 139 134 - 144 mmol/L   Potassium 4.7 3.5 - 5.2 mmol/L   Chloride 108 (H) 96 - 106 mmol/L   CO2 19 (L) 20 - 29 mmol/L   Calcium 8.5 (L) 8.7 - 10.2 mg/dL   Total Protein 6.0 6.0 - 8.5 g/dL   Albumin 4.0 3.8 - 4.9 g/dL   Globulin, Total  2.0 1.5 - 4.5 g/dL   Albumin/Globulin Ratio 2.0 1.2 - 2.2   Bilirubin Total <0.2 0.0 - 1.2 mg/dL   Alkaline Phosphatase 60 48 - 121 IU/L   AST 17 0 - 40 IU/L   ALT 23 0 - 44 IU/L      Assessment & Plan:   Problem List Items Addressed This Visit      Cardiovascular and Mediastinum   Hypertension    Chronic, stable.  BP appears to be well-controlled at  home.  Continue DASH diet and lifestyle changes, continue to monitor BP at home.  CMP, urine microalbumin checked today.  Follow up in 3 months.      Relevant Orders   Microalbumin, Urine Waived   Comprehensive metabolic panel     Endocrine   Type 2 diabetes mellitus with diabetic neuropathy, unspecified (HCC) - Primary    Chronic, stable according to home readings.  Last A1c 5.7%, will recheck today.  Continue to watch carbohydrates and take prescribed daily medication for diabetes.  Plans to get in with eye doctor in October.  Declines foot examination today, is going to get in with Podiatrist next month.  Follow up in 3 months.      Relevant Orders   Microalbumin, Urine Waived   Comprehensive metabolic panel   HgB A1c     Other   Leg cramp    Acute, ongoing x 20 days.  Differentials include electrolyte imbalance, dehydration, claudication, or blood clot.  Will check electrolytes and kidney function today.  Encouraged increasingly water intake drastically.  If no improvement, may need to consider work up for claudication.          Follow up plan: Return in about 3 months (around 05/16/2020) for diabetes, HTN/HLD follow up.

## 2020-02-15 LAB — COMPREHENSIVE METABOLIC PANEL
ALT: 61 IU/L — ABNORMAL HIGH (ref 0–44)
AST: 53 IU/L — ABNORMAL HIGH (ref 0–40)
Albumin/Globulin Ratio: 1.8 (ref 1.2–2.2)
Albumin: 3.5 g/dL — ABNORMAL LOW (ref 3.8–4.9)
Alkaline Phosphatase: 58 IU/L (ref 48–121)
BUN/Creatinine Ratio: 14 (ref 9–20)
BUN: 17 mg/dL (ref 6–24)
Bilirubin Total: <0.2 mg/dL (ref 0.0–1.2)
CO2: 20 mmol/L (ref 20–29)
Calcium: 8.4 mg/dL — ABNORMAL LOW (ref 8.7–10.2)
Chloride: 108 mmol/L — ABNORMAL HIGH (ref 96–106)
Creatinine, Ser: 1.21 mg/dL (ref 0.76–1.27)
GFR calc Af Amer: 78 mL/min/{1.73_m2} (ref >59)
GFR calc non Af Amer: 67 mL/min/{1.73_m2} (ref >59)
Globulin, Total: 1.9 g/dL (ref 1.5–4.5)
Glucose: 128 mg/dL — ABNORMAL HIGH (ref 65–99)
Potassium: 4.3 mmol/L (ref 3.5–5.2)
Sodium: 143 mmol/L (ref 134–144)
Total Protein: 5.4 g/dL — ABNORMAL LOW (ref 6.0–8.5)

## 2020-02-15 LAB — HEMOGLOBIN A1C
Est. average glucose Bld gHb Est-mCnc: 123 mg/dL
Hgb A1c MFr Bld: 5.9 % — ABNORMAL HIGH (ref 4.8–5.6)

## 2020-02-15 NOTE — Addendum Note (Signed)
Addended by: Cathlean Marseilles A on: 02/15/2020 02:47 PM   Modules accepted: Orders

## 2020-02-28 ENCOUNTER — Ambulatory Visit: Payer: Medicaid Other | Admitting: Nurse Practitioner

## 2020-02-29 ENCOUNTER — Other Ambulatory Visit: Payer: Self-pay

## 2020-02-29 MED ORDER — GABAPENTIN 600 MG PO TABS: 600.0000 mg | ORAL_TABLET | Freq: Three times a day (TID) | ORAL | 1 refills | Status: DC | PRN

## 2020-02-29 NOTE — Telephone Encounter (Signed)
Patient last seen 02/14/20

## 2020-03-15 ENCOUNTER — Ambulatory Visit: Payer: Medicaid Other | Admitting: Podiatry

## 2020-03-31 ENCOUNTER — Other Ambulatory Visit: Payer: Self-pay | Admitting: Family Medicine

## 2020-03-31 NOTE — Telephone Encounter (Signed)
Requested medication (s) are due for refill today: yes  Requested medication (s) are on the active medication list: no  Last refill:  05/03/19  Future visit scheduled: no  Notes to clinic:  med not assigned to protocol   Requested Prescriptions  Pending Prescriptions Disp Refills   PROCTOZONE-HC 2.5 % rectal cream [Pharmacy Med Name: PROCTOZONE-HC 2.5% TOP CREAM GM] 60 g 1    Sig: APPLY RECTALLY 2 TIMES A DAY      Off-Protocol Failed - 03/31/2020  1:06 PM      Failed - Medication not assigned to a protocol, review manually.      Passed - Valid encounter within last 12 months    Recent Outpatient Visits           1 month ago Type 2 diabetes mellitus with diabetic neuropathy, with long-term current use of insulin (HCC)   [facility] Valentino Nose, NP   4 months ago Chest pain, unspecified type   [facility] Valentino Nose, NP   11 months ago Hemorrhoids, unspecified hemorrhoid type   [facility], [place], [place]   1 year ago Type 2 diabetes mellitus with diabetic neuropathy, with long-term current use of insulin [facility])   [facility], [place], [place]   1 year ago Essential hypertension   [facility] Steele Sizer, MD             Over the Counter:  OTC Passed - 03/31/2020  1:06 PM      Passed - Valid encounter within last 12 months    Recent Outpatient Visits           1 month ago Type 2 diabetes mellitus with diabetic neuropathy, with long-term current use of insulin (HCC)   [facility] Valentino Nose, NP   4 months ago Chest pain, unspecified type   [facility] Valentino Nose, NP   11 months ago Hemorrhoids, unspecified hemorrhoid type   [facility], [place], [place]   1 year ago Type 2 diabetes mellitus with diabetic neuropathy, with long-term current use of insulin [facility])   [facility],  [place], [place]   1 year ago Essential hypertension   [facility] Crissman, Redge Gainer, MD

## 2020-04-02 NOTE — Telephone Encounter (Signed)
Pt stated he is no longer is using ointment.

## 2020-05-03 ENCOUNTER — Other Ambulatory Visit: Payer: Self-pay

## 2020-05-03 ENCOUNTER — Encounter: Payer: Self-pay | Admitting: Podiatry

## 2020-05-03 ENCOUNTER — Ambulatory Visit: Payer: Medicaid Other | Admitting: Podiatry

## 2020-05-03 ENCOUNTER — Ambulatory Visit (INDEPENDENT_AMBULATORY_CARE_PROVIDER_SITE_OTHER): Payer: Medicaid Other | Admitting: Podiatry

## 2020-05-03 DIAGNOSIS — B351 Tinea unguium: Secondary | ICD-10-CM | POA: Diagnosis not present

## 2020-05-03 DIAGNOSIS — Z794 Long term (current) use of insulin: Secondary | ICD-10-CM | POA: Diagnosis not present

## 2020-05-03 DIAGNOSIS — M79674 Pain in right toe(s): Secondary | ICD-10-CM

## 2020-05-03 DIAGNOSIS — E1140 Type 2 diabetes mellitus with diabetic neuropathy, unspecified: Secondary | ICD-10-CM

## 2020-05-03 DIAGNOSIS — M79675 Pain in left toe(s): Secondary | ICD-10-CM

## 2020-05-03 NOTE — Progress Notes (Signed)
Complaint:  Visit Type: Patient returns to my office for continued preventative foot care services. Complaint: Patient states" my nails have grown long and thick and become painful to walk and wear shoes" Patient has been diagnosed with DM with no foot complications. The patient presents for preventative foot care services.  Podiatric Exam: Vascular: dorsalis pedis and posterior tibial pulses are palpable bilateral. Capillary return is immediate. Temperature gradient is WNL. Skin turgor WNL  Sensorium: Normal Semmes Weinstein monofilament test. Normal tactile sensation bilaterally. Nail Exam: Pt has thick disfigured discolored nails with subungual debris noted bilateral entire nail hallux through fifth toenails Ulcer Exam: There is no evidence of ulcer or pre-ulcerative changes or infection. Orthopedic Exam: Muscle tone and strength are WNL. No limitations in general ROM. No crepitus or effusions noted. Foot type and digits show no abnormalities. Bony prominences are unremarkable. Skin: No Porokeratosis. No infection or ulcers  Diagnosis:  Onychomycosis, , Pain in right toe, pain in left toes  Treatment & Plan Procedures and Treatment: Consent by patient was obtained for treatment procedures.   Debridement of mycotic and hypertrophic toenails, 1 through 5 bilateral and clearing of subungual debris. No ulceration, no infection noted.  Return Visit-Office Procedure: Patient instructed to return to the office for a follow up visit 3 months for continued evaluation and treatment.    Neno Hohensee DPM 

## 2020-07-30 ENCOUNTER — Other Ambulatory Visit: Payer: Self-pay | Admitting: Family Medicine

## 2020-07-30 NOTE — Telephone Encounter (Signed)
Requested medication (s) are due for refill today:   Yes  Requested medication (s) are on the active medication list:   Yes  Future visit scheduled:   No   Last ordered: 06/26/2019 #100, 11 refills  Clinic note:  Returned because has not seen a provider since Emily left.    Saw Roosvelt Maser prior to that.   Requested Prescriptions  Pending Prescriptions Disp Refills   ULTRACARE PEN NEEDLES 32G X 4 MM MISC [Pharmacy Med Name: ULTRACARE PEN NEEDLES 32G X 4 MM] 100 each 11    Sig: USE 3 TIMES DAILY AS NEEDED      Endocrinology: Diabetes - Testing Supplies Passed - 07/30/2020  1:05 PM      Passed - Valid encounter within last 12 months    Recent Outpatient Visits           5 months ago Type 2 diabetes mellitus with diabetic neuropathy, with long-term current use of insulin (HCC)   [facility] Valentino Nose, NP   8 months ago Chest pain, unspecified type   [facility] Valentino Nose, NP   1 year ago Hemorrhoids, unspecified hemorrhoid type   [facility], [place], [place]   1 year ago Type 2 diabetes mellitus with diabetic neuropathy, with long-term current use of insulin [facility])   [facility] [place], [place], [place]   1 year ago Essential hypertension   [facility] Crissman, Redge Gainer, MD

## 2020-07-30 NOTE — Telephone Encounter (Signed)
Patient has not seen another provider yet- due 1 month- rx filled by provider in office- patient will nedd follow up appointment.

## 2020-08-06 ENCOUNTER — Ambulatory Visit: Payer: Medicaid Other | Admitting: Podiatry

## 2020-08-29 ENCOUNTER — Other Ambulatory Visit: Payer: Self-pay | Admitting: Nurse Practitioner

## 2020-08-29 NOTE — Telephone Encounter (Signed)
Called pt no answer °

## 2020-08-29 NOTE — Telephone Encounter (Signed)
Requested medication (s) are due for refill today : yes  Requested medication (s) are on the active medication list: yes  Last refill:  2/72022  Future visit scheduled: no  Notes to clinic:  overdue for follow up Last appt 02/14/2020 Vm left to call office    Requested Prescriptions  Pending Prescriptions Disp Refills   ACCU-CHEK AVIVA PLUS test strip [Pharmacy Med Name: ACCU-CHEK AVIVA PLUS STRIP] 100 each 0    Sig: TEST TWICE DAILY      Endocrinology: Diabetes - Testing Supplies Passed - 08/29/2020  1:29 PM      Passed - Valid encounter within last 12 months    Recent Outpatient Visits           6 months ago Type 2 diabetes mellitus with diabetic neuropathy, with long-term current use of insulin (HCC)   [facility] Valentino Nose, NP   9 months ago Chest pain, unspecified type   [facility] Valentino Nose, NP   1 year ago Hemorrhoids, unspecified hemorrhoid type   [facility], [place], [place]   1 year ago Type 2 diabetes mellitus with diabetic neuropathy, with long-term current use of insulin [facility])   [facility] [place], Sal[place], [place]   1 year ago Essential hypertension   [facility], Redge Gainer, MD

## 2020-08-29 NOTE — Telephone Encounter (Signed)
Patient needs an appointment before refill on medication.

## 2020-08-31 NOTE — Telephone Encounter (Signed)
Unable to lvm to make apt °

## 2020-09-03 ENCOUNTER — Encounter: Payer: Self-pay | Admitting: Internal Medicine

## 2020-09-03 NOTE — Telephone Encounter (Signed)
Called pt no answer no vm mailed letter

## 2020-09-10 ENCOUNTER — Ambulatory Visit (INDEPENDENT_AMBULATORY_CARE_PROVIDER_SITE_OTHER): Payer: Medicaid Other | Admitting: Internal Medicine

## 2020-09-10 ENCOUNTER — Encounter: Payer: Self-pay | Admitting: Internal Medicine

## 2020-09-10 ENCOUNTER — Other Ambulatory Visit: Payer: Self-pay

## 2020-09-10 VITALS — BP 128/91 | HR 101 | Temp 98.4°F | Ht 71.5 in | Wt 241.2 lb

## 2020-09-10 DIAGNOSIS — S8012XA Contusion of left lower leg, initial encounter: Secondary | ICD-10-CM

## 2020-09-10 DIAGNOSIS — E119 Type 2 diabetes mellitus without complications: Secondary | ICD-10-CM

## 2020-09-10 DIAGNOSIS — G8929 Other chronic pain: Secondary | ICD-10-CM

## 2020-09-10 NOTE — Progress Notes (Signed)
BP (!) 128/91   Pulse (!) 101   Temp 98.4 F (36.9 C) (Oral)   Ht 5' 11.5" (1.816 m)   Wt 241 lb 3.2 oz (109.4 kg)   SpO2 98%   BMI 33.18 kg/m    Subjective:    Lance Vaughan ID: Lance Vaughan, male    DOB: January 11, 1966, 55 y.o.   MRN: [id number]  HPI: Lance Vaughan is a 55 y.o. male  Diabetes He presents for his follow-up diabetic visit. He has type 2 diabetes mellitus. Pertinent negatives for diabetes include no blurred vision, no chest pain, no fatigue, no foot ulcerations, no polydipsia, no polyphagia, no visual change, no weakness and no weight loss.  Leg Pain  The incident occurred 5 to 7 days ago. The incident occurred in the street (at [employer]). The pain is present in the left leg. The pain is at a severity of 10/10. The pain is moderate. Associated symptoms include an inability to bear weight. Pertinent negatives include no loss of motion, loss of sensation, muscle weakness, numbness or tingling. He has tried heat, rest, ice and acetaminophen for the symptoms.  Medication Refill Pertinent negatives include no chest pain, fatigue, numbness, visual change or weakness.    Chief Complaint  Lance Vaughan presents with  . Diabetes  . Leg Pain    Left leg pain, has been painful for past 3 days, feels like someone has kicked him in the back of the knee, not able to sleep  . Medication Refill    Relevant past medical, surgical, family and social history reviewed and updated as indicated. Interim medical history since our last visit reviewed. Allergies and medications reviewed and updated.  Review of Systems  Constitutional: Negative for fatigue and weight loss.  Eyes: Negative for blurred vision.  Cardiovascular: Negative for chest pain.  Endocrine: Negative for polydipsia and polyphagia.  Neurological: Negative for tingling, weakness and numbness.    Per HPI unless specifically indicated above     Objective:    BP (!) 128/91   Pulse (!) 101   Temp 98.4 F (36.9 C) (Oral)   Ht 5'  11.5" (1.816 m)   Wt 241 lb 3.2 oz (109.4 kg)   SpO2 98%   BMI 33.18 kg/m   Wt Readings from Last 3 Encounters:  09/10/20 241 lb 3.2 oz (109.4 kg)  02/14/20 236 lb 6.4 oz (107.2 kg)  11/25/19 243 lb 2 oz (110.3 kg)    Physical Exam Vitals and nursing note reviewed.  Constitutional:      General: He is not in acute distress.    Appearance: Normal appearance. He is not ill-appearing or diaphoretic.  HENT:     Head: Normocephalic and atraumatic.     Right Ear: Tympanic membrane and external ear normal. There is no impacted cerumen.     Left Ear: External ear normal.     Nose: No congestion or rhinorrhea.     Mouth/Throat:     Pharynx: No oropharyngeal exudate or posterior oropharyngeal erythema.  Eyes:     Conjunctiva/sclera: Conjunctivae normal.     Pupils: Pupils are equal, round, and reactive to light.  Cardiovascular:     Rate and Rhythm: Normal rate and regular rhythm.     Heart sounds: No murmur heard. No friction rub. No gallop.   Pulmonary:     Effort: No respiratory distress.     Breath sounds: No stridor. No wheezing or rhonchi.  Chest:     Chest wall: No tenderness.  Abdominal:  General: Abdomen is flat. Bowel sounds are normal.     Palpations: Abdomen is soft. There is no mass.     Tenderness: There is no abdominal tenderness.  Musculoskeletal:     Cervical back: Normal range of motion and neck supple. No rigidity or tenderness.     Left lower leg: No edema.  Skin:    General: Skin is warm.  Neurological:     Mental Status: He is alert.     Results for orders placed or performed in visit on 02/14/20  Microalbumin, Urine Waived  Result Value Ref Range   Microalb, Ur Waived 30 (H) 0 - 19 mg/L   Creatinine, Urine Waived 200 10 - 300 mg/dL   Microalb/Creat Ratio <30 <30 mg/g  Comprehensive metabolic panel  Result Value Ref Range   Glucose 128 (H) 65 - 99 mg/dL   BUN 17 6 - 24 mg/dL   Creatinine, Ser 7.09 0.76 - 1.27 mg/dL   GFR calc non Af Amer 67  >59 mL/min/1.73   GFR calc Af Amer 78 >59 mL/min/1.73   BUN/Creatinine Ratio 14 9 - 20   Sodium 143 134 - 144 mmol/L   Potassium 4.3 3.5 - 5.2 mmol/L   Chloride 108 (H) 96 - 106 mmol/L   CO2 20 20 - 29 mmol/L   Calcium 8.4 (L) 8.7 - 10.2 mg/dL   Total Protein 5.4 (L) 6.0 - 8.5 g/dL   Albumin 3.5 (L) 3.8 - 4.9 g/dL   Globulin, Total 1.9 1.5 - 4.5 g/dL   Albumin/Globulin Ratio 1.8 1.2 - 2.2   Bilirubin Total <0.2 0.0 - 1.2 mg/dL   Alkaline Phosphatase 58 48 - 121 IU/L   AST 53 (H) 0 - 40 IU/L   ALT 61 (H) 0 - 44 IU/L  HgB A1c  Result Value Ref Range   Hgb A1c MFr Bld 5.9 (H) 4.8 - 5.6 %   Est. average glucose Bld gHb Est-mCnc 123 mg/dL      Assessment & Plan:  1.  Leg pain : with bruising  D/w radioloy tech  DVT protocol ordered as well as soft tissue.   2. Dm : a1c last aug -- was 5.9  is on ozempic, apidra and farxiga for such check HbA1c,  urine  microalbumin  diabetic diet plan given to pt  adviced regarding hypoglycemia and instructions given to pt today on how to prevent and treat the same if it were to occur. pt acknowledges the plan and voices understanding of the same.  exercise plan given and encouraged.   advice diabetic yearly podiatry, ophthalmology , nutritionist , dental check q 6 months,  3. Pain management : Will need to refer to pain mx @ hospital [facility] Pt to d/w them reg chronic pain recently from [facility] and was on pain medications per pain management doctor.  He was referred to [facility] however could not keep up with traveling there secondary to him being "disabled  Medically/ physically "   pt just walked out the door and didn't want to go to the ER.  Per Lance Vaughan he wants to see a pain management specialist however the nearest one will be in [place] advised Lance Vaughan to follow-up with them - appropriate referrals were made. However Lance Vaughan was not very happy with the current plan , he stormed out of the office without making appointments.    Problem  List Items Addressed This Visit   None   Visit Diagnoses    Diabetes mellitus without  complication (HCC)    -  Primary   Relevant Orders   Bayer DCA Hb A1c Waived (STAT)   Contusion of left lower leg, initial encounter       Relevant Orders   US Venous Img Lower Unilateral Left (DVT)   Other chronic pain       Relevant Orders   Ambulatory referral to Pain Clinic       Follow up plan: No follow-ups on file.

## 2020-09-11 ENCOUNTER — Ambulatory Visit
Admission: RE | Admit: 2020-09-11 | Discharge: 2020-09-11 | Disposition: A | Discharge status: Home / Self Care | Payer: Medicaid Other | Source: Ambulatory Visit | Attending: Internal Medicine | Admitting: Internal Medicine

## 2020-09-11 ENCOUNTER — Other Ambulatory Visit: Payer: Self-pay

## 2020-09-11 DIAGNOSIS — M7122 Synovial cyst of popliteal space [Baker], left knee: Secondary | ICD-10-CM

## 2020-09-11 DIAGNOSIS — S8012XA Contusion of left lower leg, initial encounter: Secondary | ICD-10-CM | POA: Diagnosis not present

## 2020-09-17 ENCOUNTER — Ambulatory Visit: Payer: Medicaid Other | Admitting: Orthopedic Surgery

## 2020-09-27 ENCOUNTER — Ambulatory Visit: Payer: Medicaid Other | Admitting: Podiatry

## 2020-09-28 ENCOUNTER — Other Ambulatory Visit: Payer: Self-pay

## 2020-09-28 NOTE — Telephone Encounter (Signed)
Refill request for Gabapentin  600 mg TID PRN  LOV:  09/10/20  No up coming appointment

## 2020-10-04 ENCOUNTER — Ambulatory Visit (INDEPENDENT_AMBULATORY_CARE_PROVIDER_SITE_OTHER): Payer: Medicaid Other | Admitting: Podiatry

## 2020-10-04 ENCOUNTER — Other Ambulatory Visit: Payer: Self-pay

## 2020-10-04 ENCOUNTER — Encounter: Payer: Self-pay | Admitting: Podiatry

## 2020-10-04 DIAGNOSIS — E1140 Type 2 diabetes mellitus with diabetic neuropathy, unspecified: Secondary | ICD-10-CM | POA: Diagnosis not present

## 2020-10-04 DIAGNOSIS — M79675 Pain in left toe(s): Secondary | ICD-10-CM

## 2020-10-04 DIAGNOSIS — Z794 Long term (current) use of insulin: Secondary | ICD-10-CM | POA: Diagnosis not present

## 2020-10-04 DIAGNOSIS — B351 Tinea unguium: Secondary | ICD-10-CM | POA: Diagnosis not present

## 2020-10-04 DIAGNOSIS — M79674 Pain in right toe(s): Secondary | ICD-10-CM

## 2020-10-04 NOTE — Progress Notes (Signed)
Complaint:  Visit Type: Patient returns to my office for continued preventative foot care services. Complaint: Patient states" my nails have grown long and thick and become painful to walk and wear shoes" Patient has been diagnosed with DM with no foot complications. The patient presents for preventative foot care services.  Podiatric Exam: Vascular: dorsalis pedis and posterior tibial pulses are palpable bilateral. Capillary return is immediate. Temperature gradient is WNL. Skin turgor WNL  Sensorium: Normal Semmes Weinstein monofilament test. Normal tactile sensation bilaterally. Nail Exam: Pt has thick disfigured discolored nails with subungual debris noted bilateral entire nail hallux through fifth toenails Ulcer Exam: There is no evidence of ulcer or pre-ulcerative changes or infection. Orthopedic Exam: Muscle tone and strength are WNL. No limitations in general ROM. No crepitus or effusions noted. Foot type and digits show no abnormalities. Bony prominences are unremarkable. Skin: No Porokeratosis. No infection or ulcers  Diagnosis:  Onychomycosis, , Pain in right toe, pain in left toes  Treatment & Plan Procedures and Treatment: Consent by patient was obtained for treatment procedures.   Debridement of mycotic and hypertrophic toenails, 1 through 5 bilateral and clearing of subungual debris. No ulceration, no infection noted.  Return Visit-Office Procedure: Patient instructed to return to the office for a follow up visit 3 months for continued evaluation and treatment.    Nekita Pita DPM 

## 2020-10-05 ENCOUNTER — Other Ambulatory Visit: Payer: Self-pay | Admitting: Internal Medicine

## 2020-10-08 ENCOUNTER — Telehealth: Payer: Self-pay

## 2020-10-08 NOTE — Telephone Encounter (Signed)
PA for Farxiga initiated and submitted via Cover My Meds. Key: [id number]

## 2020-11-02 ENCOUNTER — Other Ambulatory Visit: Payer: Self-pay

## 2020-11-02 NOTE — Telephone Encounter (Signed)
LOV 09/10/20  No upcoming appt.

## 2020-11-02 NOTE — Telephone Encounter (Deleted)
LOV 09/10/20  No upcoming appt. 

## 2020-11-03 ENCOUNTER — Other Ambulatory Visit: Payer: Self-pay | Admitting: Internal Medicine

## 2020-11-03 NOTE — Telephone Encounter (Signed)
Requested Prescriptions  Pending Prescriptions Disp Refills  . ACCU-CHEK AVIVA PLUS test strip [Pharmacy Med Name: ACCU-CHEK AVIVA PLUS STRIP] 100 each 1    Sig: TEST TWICE DAILY     Endocrinology: Diabetes - Testing Supplies Passed - 11/03/2020  4:59 PM      Passed - Valid encounter within last 12 months    Recent Outpatient Visits          1 month ago Diabetes mellitus without complication (HCC)   [facility] Vigg, Avanti, MD   8 months ago Type 2 diabetes mellitus with diabetic neuropathy, with long-term current use of insulin (HCC)   [facility] Valentino Nose, NP   12 months ago Chest pain, unspecified type   [facility] Valentino Nose, NP   1 year ago Hemorrhoids, unspecified hemorrhoid type   [facility], [place], [place]   1 year ago Type 2 diabetes mellitus with diabetic neuropathy, with long-term current use of insulin [facility])   [facility] [place], [place], [place]

## 2020-11-29 ENCOUNTER — Other Ambulatory Visit: Payer: Self-pay

## 2020-11-29 NOTE — Telephone Encounter (Signed)
Patient last seen in March and has follow up scheduled.

## 2020-12-05 MED ORDER — GABAPENTIN 600 MG PO TABS: 600.0000 mg | ORAL_TABLET | Freq: Three times a day (TID) | ORAL | 1 refills | Status: DC | PRN

## 2021-01-03 ENCOUNTER — Ambulatory Visit: Payer: Medicaid Other | Admitting: Podiatry

## 2021-03-21 ENCOUNTER — Ambulatory Visit: Payer: Medicaid Other | Admitting: Podiatry

## 2021-03-21 ENCOUNTER — Other Ambulatory Visit: Payer: Self-pay

## 2021-03-21 ENCOUNTER — Encounter: Payer: Self-pay | Admitting: Podiatry

## 2021-03-21 DIAGNOSIS — B351 Tinea unguium: Secondary | ICD-10-CM

## 2021-03-21 DIAGNOSIS — M79675 Pain in left toe(s): Secondary | ICD-10-CM | POA: Diagnosis not present

## 2021-03-21 DIAGNOSIS — M79674 Pain in right toe(s): Secondary | ICD-10-CM

## 2021-03-21 DIAGNOSIS — Z794 Long term (current) use of insulin: Secondary | ICD-10-CM

## 2021-03-21 DIAGNOSIS — E1140 Type 2 diabetes mellitus with diabetic neuropathy, unspecified: Secondary | ICD-10-CM

## 2021-03-21 NOTE — Progress Notes (Signed)
Complaint:  Visit Type: Patient returns to my office for continued preventative foot care services. Complaint: Patient states" my nails have grown long and thick and become painful to walk and wear shoes" Patient has been diagnosed with DM with no foot complications. The patient presents for preventative foot care services.  Podiatric Exam: Vascular: dorsalis pedis and posterior tibial pulses are palpable bilateral. Capillary return is immediate. Temperature gradient is WNL. Skin turgor WNL  Sensorium: Normal Semmes Weinstein monofilament test. Normal tactile sensation bilaterally. Nail Exam: Pt has thick disfigured discolored nails with subungual debris noted bilateral entire nail hallux through fifth toenails Ulcer Exam: There is no evidence of ulcer or pre-ulcerative changes or infection. Orthopedic Exam: Muscle tone and strength are WNL. No limitations in general ROM. No crepitus or effusions noted. Foot type and digits show no abnormalities. Bony prominences are unremarkable. Skin: No Porokeratosis. No infection or ulcers  Diagnosis:  Onychomycosis, , Pain in right toe, pain in left toes  Treatment & Plan Procedures and Treatment: Consent by patient was obtained for treatment procedures.   Debridement of mycotic and hypertrophic toenails, 1 through 5 bilateral and clearing of subungual debris. No ulceration, no infection noted.  Return Visit-Office Procedure: Patient instructed to return to the office for a follow up visit 3 months for continued evaluation and treatment.    Leslea Vowles DPM 

## 2021-03-29 ENCOUNTER — Other Ambulatory Visit: Payer: Self-pay | Admitting: Nurse Practitioner

## 2021-06-17 ENCOUNTER — Other Ambulatory Visit: Payer: Self-pay | Admitting: Internal Medicine

## 2021-06-18 NOTE — Telephone Encounter (Signed)
Requested medication (s) are due for refill today: yes  Requested medication (s) are on the active medication list: yes  Last refill:  03/29/21 #30/0RF  Future visit scheduled: no  Notes to clinic:  Unable to refill per protocol due to failed labs, no updated results. Pt also needs appt scheduled.      Requested Prescriptions  Pending Prescriptions Disp Refills   metoprolol succinate (TOPROL-XL) 25 MG 24 hr tablet [Pharmacy Med Name: METOPROLOL SUCCINATE ER 25 MG TAB] 30 tablet 0    Sig: TAKE 1 TABLET BY MOUTH ONCE DAILY     Cardiovascular:  Beta Blockers Failed - 06/17/2021 10:10 AM      Failed - Last BP in normal range    BP Readings from Last 1 Encounters:  09/10/20 (!) 128/91          Failed - Valid encounter within last 6 months    Recent Outpatient Visits           9 months ago Diabetes mellitus without complication ([place])   [place] Vigg, Avanti, MD   1 year ago Type 2 diabetes mellitus with diabetic neuropathy, with long-term current use of insulin ([place])   [place] [name], NP   1 year ago Chest pain, unspecified type   [facility] [name], NP   2 years ago Hemorrhoids, unspecified hemorrhoid type   [facility], [place], [place]   2 years ago Type 2 diabetes mellitus with diabetic neuropathy, with long-term current use of insulin [facility] [place])   [place], [place], [place]              Passed - Last Heart Rate in normal range    Pulse Readings from Last 1 Encounters:  09/10/20 (!) 101           atorvastatin (LIPITOR) 40 MG tablet [Pharmacy Med Name: ATORVASTATIN CALCIUM 40 MG TAB] 30 tablet 0    Sig: TAKE 1 TABLET BY MOUTH AT BEDTIME     Cardiovascular:  Antilipid - Statins Failed - 06/17/2021 10:10 AM      Failed - Total Cholesterol in normal range and within 360 days    Cholesterol, Total  Date Value Ref Range Status  11/08/2019 149 100 -  199 mg/dL Final          Failed - LDL in normal range and within 360 days    LDL Chol Calc (NIH)  Date Value Ref Range Status  11/08/2019 71 0 - 99 mg/dL Final          Failed - HDL in normal range and within 360 days    HDL  Date Value Ref Range Status  11/08/2019 44 >39 mg/dL Final          Failed - Triglycerides in normal range and within 360 days    Triglycerides  Date Value Ref Range Status  11/08/2019 203 (H) 0 - 149 mg/dL Final          Passed - Patient is not pregnant      Passed - Valid encounter within last 12 months    Recent Outpatient Visits           9 months ago Diabetes mellitus without complication ([place])   [place], Avanti, MD   1 year ago Type 2 diabetes mellitus with diabetic neuropathy, with long-term current use of insulin ([place])   [place] [name], NP  1 year ago Chest pain, unspecified type   [facility] [name], NP   2 years ago Hemorrhoids, unspecified hemorrhoid type   [facility] Merrie Roof [place], [place]   2 years ago Type 2 diabetes mellitus with diabetic neuropathy, with long-term current use of insulin ([place])   [facility] Merrie Roof [place], [place]               amLODipine ([place]) 10 MG tablet [Pharmacy Med Name: AMLODIPINE BESYLATE 10 MG TAB] 30 tablet 0    Sig: TAKE 1 TABLET BY MOUTH ONCE DAILY     Cardiovascular:  Calcium Channel Blockers Failed - 06/17/2021 10:10 AM      Failed - Last BP in normal range    BP Readings from Last 1 Encounters:  09/10/20 (!) 128/91          Failed - Valid encounter within last 6 months    Recent Outpatient Visits           9 months ago Diabetes mellitus without complication ([place])   [place] Vigg, Avanti, MD   1 year ago Type 2 diabetes mellitus with diabetic neuropathy, with long-term current use of insulin ([place])   [place], Jessica A, NP    1 year ago Chest pain, unspecified type   [facility] [name], NP   2 years ago Hemorrhoids, unspecified hemorrhoid type   [facility], [place], [place]   2 years ago Type 2 diabetes mellitus with diabetic neuropathy, with long-term current use of insulin ([place])   [facility], [place], PA-C               FARXIGA 5 MG TABS tablet [Pharmacy Med Name: FARXIGA 5 MG TAB] 30 tablet 0    Sig: TAKE 1 TABLET BY MOUTH ONCE DAILY     Endocrinology:  Diabetes - SGLT2 Inhibitors Failed - 06/17/2021 10:10 AM      Failed - Cr in normal range and within 360 days    Creatinine, Ser  Date Value Ref Range Status  02/14/2020 1.21 0.76 - 1.27 mg/dL Final          Failed - LDL in normal range and within 360 days    LDL Chol Calc (NIH)  Date Value Ref Range Status  11/08/2019 71 0 - 99 mg/dL Final          Failed - HBA1C is between 0 and 7.9 and within 180 days    HB A1C (BAYER DCA - WAIVED)  Date Value Ref Range Status  11/08/2019 5.7 <7.0 % Final    Comment:                                          Diabetic Adult            <7.0                                       Healthy Adult        4.3 - 5.7                                                           (  DCCT/NGSP) American Diabetes Association's Summary of Glycemic Recommendations for Adults with Diabetes: Hemoglobin A1c <7.0%. More stringent glycemic goals (A1c <6.0%) may further reduce complications at the cost of increased risk of hypoglycemia.    Hgb A1c MFr Bld  Date Value Ref Range Status  02/14/2020 5.9 (H) 4.8 - 5.6 % Final    Comment:             Prediabetes: 5.7 - 6.4          Diabetes: >6.4          Glycemic control for adults with diabetes: <7.0           Failed - AA eGFR in normal range and within 360 days    GFR calc Af Amer  Date Value Ref Range Status  02/14/2020 78 >59 mL/min/1.73 Final    Comment:    **[facility] currently reports eGFR  in compliance with the current**   recommendations of the [employer]. [facility] will   update reporting as new guidelines are published from the NKF-ASN   Task force.    GFR calc non Af Amer  Date Value Ref Range Status  02/14/2020 67 >59 mL/min/1.73 Final          Failed - Valid encounter within last 6 months    Recent Outpatient Visits           9 months ago Diabetes mellitus without complication ([place])   [facility] Vigg, Avanti, MD   1 year ago Type 2 diabetes mellitus with diabetic neuropathy, with long-term current use of insulin ([place])   [place] [name], NP   1 year ago Chest pain, unspecified type   [facility] [name], NP   2 years ago Hemorrhoids, unspecified hemorrhoid type   [facility], [place], [place]   2 years ago Type 2 diabetes mellitus with diabetic neuropathy, with long-term current use of insulin [facility])   [facility], [place], [place]              Signed Prescriptions Disp Refills   gabapentin (NEURONTIN) 600 MG tablet 270 tablet 0    Sig: TAKE 1 TABLET BY MOUTH 3 TIMES DAILY AS NEEDED     Neurology: Anticonvulsants - gabapentin Passed - 06/17/2021 10:10 AM      Passed - Valid encounter within last 12 months    Recent Outpatient Visits           9 months ago Diabetes mellitus without complication ([place])   [facility] Vigg, Avanti, MD   1 year ago Type 2 diabetes mellitus with diabetic neuropathy, with long-term current use of insulin ([place])   [place], Jessica A, NP   1 year ago Chest pain, unspecified type   [facility] [name], NP   2 years ago Hemorrhoids, unspecified hemorrhoid type   [facility], [place], [place]   2 years ago Type 2 diabetes mellitus with diabetic neuropathy, with long-term current use of insulin [facility])   [place], [place], [place]

## 2021-06-18 NOTE — Telephone Encounter (Signed)
Requested Prescriptions  Pending Prescriptions Disp Refills   gabapentin (NEURONTIN) 600 MG tablet [Pharmacy Med Name: GABAPENTIN 600 MG TAB] 270 tablet 1    Sig: TAKE 1 TABLET BY MOUTH 3 TIMES DAILY AS NEEDED     Neurology: Anticonvulsants - gabapentin Passed - 06/17/2021 10:10 AM      Passed - Valid encounter within last 12 months    Recent Outpatient Visits          9 months ago Diabetes mellitus without complication ([place])   [facility] Vigg, Avanti, MD   1 year ago Type 2 diabetes mellitus with diabetic neuropathy, with long-term current use of insulin ([place])   [place] [name], NP   1 year ago Chest pain, unspecified type   [facility] [name], NP   2 years ago Hemorrhoids, unspecified hemorrhoid type   [facility], Lilia Argue, [place]   2 years ago Type 2 diabetes mellitus with diabetic neuropathy, with long-term current use of insulin ([place])   [facility], [place], [place]              metoprolol succinate (TOPROL-XL) 25 MG 24 hr tablet [Pharmacy Med Name: METOPROLOL SUCCINATE ER 25 MG TAB] 30 tablet 0    Sig: TAKE 1 TABLET BY MOUTH ONCE DAILY     Cardiovascular:  Beta Blockers Failed - 06/17/2021 10:10 AM      Failed - Last BP in normal range    BP Readings from Last 1 Encounters:  09/10/20 (!) 128/91         Failed - Valid encounter within last 6 months    Recent Outpatient Visits          9 months ago Diabetes mellitus without complication ([place])   [place] Vigg, Avanti, MD   1 year ago Type 2 diabetes mellitus with diabetic neuropathy, with long-term current use of insulin ([place])   [place], Jessica A, NP   1 year ago Chest pain, unspecified type   [facility] [name], NP   2 years ago Hemorrhoids, unspecified hemorrhoid type   [facility], [place], [place]   2  years ago Type 2 diabetes mellitus with diabetic neuropathy, with long-term current use of insulin [facility])   [place], [place], [place]             Passed - Last Heart Rate in normal range    Pulse Readings from Last 1 Encounters:  09/10/20 (!) 101          atorvastatin (LIPITOR) 40 MG tablet [Pharmacy Med Name: ATORVASTATIN CALCIUM 40 MG TAB] 30 tablet 0    Sig: TAKE 1 TABLET BY MOUTH AT BEDTIME     Cardiovascular:  Antilipid - Statins Failed - 06/17/2021 10:10 AM      Failed - Total Cholesterol in normal range and within 360 days    Cholesterol, Total  Date Value Ref Range Status  11/08/2019 149 100 - 199 mg/dL Final         Failed - LDL in normal range and within 360 days    LDL Chol Calc (NIH)  Date Value Ref Range Status  11/08/2019 71 0 - 99 mg/dL Final         Failed - HDL in normal range and within 360 days    HDL  Date Value Ref Range Status  11/08/2019 44 >39 mg/dL Final  Failed - Triglycerides in normal range and within 360 days    Triglycerides  Date Value Ref Range Status  11/08/2019 203 (H) 0 - 149 mg/dL Final         Passed - Patient is not pregnant      Passed - Valid encounter within last 12 months    Recent Outpatient Visits          9 months ago Diabetes mellitus without complication ([place])   [place], Avanti, MD   1 year ago Type 2 diabetes mellitus with diabetic neuropathy, with long-term current use of insulin ([place])   [place] [name], NP   1 year ago Chest pain, unspecified type   [facility] [name], NP   2 years ago Hemorrhoids, unspecified hemorrhoid type   [facility], [place] Argue, [place]   2 years ago Type 2 diabetes mellitus with diabetic neuropathy, with long-term current use of insulin ([place])   [facility], [place], PA-C              amLODipine ([place]) 10 MG tablet  [Pharmacy Med Name: AMLODIPINE BESYLATE 10 MG TAB] 30 tablet 0    Sig: TAKE 1 TABLET BY MOUTH ONCE DAILY     Cardiovascular:  Calcium Channel Blockers Failed - 06/17/2021 10:10 AM      Failed - Last BP in normal range    BP Readings from Last 1 Encounters:  09/10/20 (!) 128/91         Failed - Valid encounter within last 6 months    Recent Outpatient Visits          9 months ago Diabetes mellitus without complication ([place])   [facility] Vigg, Avanti, MD   1 year ago Type 2 diabetes mellitus with diabetic neuropathy, with long-term current use of insulin ([place])   [place], Jessica A, NP   1 year ago Chest pain, unspecified type   [facility] [name], NP   2 years ago Hemorrhoids, unspecified hemorrhoid type   [facility], [place], [place]   2 years ago Type 2 diabetes mellitus with diabetic neuropathy, with long-term current use of insulin ([place])   [facility], [place], PA-C              FARXIGA 5 MG TABS tablet [Pharmacy Med Name: FARXIGA 5 MG TAB] 30 tablet 0    Sig: TAKE 1 TABLET BY MOUTH ONCE DAILY     Endocrinology:  Diabetes - SGLT2 Inhibitors Failed - 06/17/2021 10:10 AM      Failed - Cr in normal range and within 360 days    Creatinine, Ser  Date Value Ref Range Status  02/14/2020 1.21 0.76 - 1.27 mg/dL Final         Failed - LDL in normal range and within 360 days    LDL Chol Calc (NIH)  Date Value Ref Range Status  11/08/2019 71 0 - 99 mg/dL Final         Failed - HBA1C is between 0 and 7.9 and within 180 days    HB A1C (BAYER DCA - WAIVED)  Date Value Ref Range Status  11/08/2019 5.7 <7.0 % Final    Comment:  Diabetic Adult            <7.0                                       Healthy Adult        4.3 - 5.7                                                           (DCCT/NGSP) American Diabetes  Association's Summary of Glycemic Recommendations for Adults with Diabetes: Hemoglobin A1c <7.0%. More stringent glycemic goals (A1c <6.0%) may further reduce complications at the cost of increased risk of hypoglycemia.    Hgb A1c MFr Bld  Date Value Ref Range Status  02/14/2020 5.9 (H) 4.8 - 5.6 % Final    Comment:             Prediabetes: 5.7 - 6.4          Diabetes: >6.4          Glycemic control for adults with diabetes: <7.0          Failed - AA eGFR in normal range and within 360 days    GFR calc Af Amer  Date Value Ref Range Status  02/14/2020 78 >59 mL/min/1.73 Final    Comment:    **[facility] currently reports eGFR in compliance with the current**   recommendations of the [employer]. [facility] will   update reporting as new guidelines are published from the NKF-ASN   Task force.    GFR calc non Af Amer  Date Value Ref Range Status  02/14/2020 67 >59 mL/min/1.73 Final         Failed - Valid encounter within last 6 months    Recent Outpatient Visits          9 months ago Diabetes mellitus without complication ([place])   [facility] Vigg, Avanti, MD   1 year ago Type 2 diabetes mellitus with diabetic neuropathy, with long-term current use of insulin ([place])   [place] [name], NP   1 year ago Chest pain, unspecified type   [facility] [name], NP   2 years ago Hemorrhoids, unspecified hemorrhoid type   [facility], [place], [place]   2 years ago Type 2 diabetes mellitus with diabetic neuropathy, with long-term current use of insulin [facility])   [place], [place], [place]

## 2021-06-20 ENCOUNTER — Ambulatory Visit: Payer: Medicaid Other | Admitting: Podiatry

## 2021-07-09 ENCOUNTER — Ambulatory Visit (INDEPENDENT_AMBULATORY_CARE_PROVIDER_SITE_OTHER): Payer: Medicaid Other | Admitting: Internal Medicine

## 2021-07-09 ENCOUNTER — Encounter: Payer: Self-pay | Admitting: Internal Medicine

## 2021-07-09 ENCOUNTER — Other Ambulatory Visit: Payer: Self-pay

## 2021-07-09 ENCOUNTER — Ambulatory Visit: Payer: Medicaid Other

## 2021-07-09 VITALS — BP 150/102 | HR 60 | Temp 98.2°F | Ht 71.65 in | Wt 235.6 lb

## 2021-07-09 DIAGNOSIS — R1030 Lower abdominal pain, unspecified: Secondary | ICD-10-CM | POA: Diagnosis not present

## 2021-07-09 DIAGNOSIS — R1032 Left lower quadrant pain: Secondary | ICD-10-CM | POA: Diagnosis not present

## 2021-07-09 DIAGNOSIS — R1012 Left upper quadrant pain: Secondary | ICD-10-CM | POA: Insufficient documentation

## 2021-07-09 LAB — CBC WITH DIFFERENTIAL/PLATELET
Hematocrit: 49.6 % (ref 37.5–51.0)
Hemoglobin: 17.3 g/dL (ref 13.0–17.7)
Lymphocytes Absolute: 1.7 10*3/uL (ref 0.7–3.1)
Lymphs: 32 %
MCH: 32.9 pg (ref 26.6–33.0)
MCHC: 34.9 g/dL (ref 31.5–35.7)
MCV: 94 fL (ref 79–97)
MID (Absolute): 0.4 10*3/uL (ref 0.1–1.6)
MID: 8 %
Neutrophils Absolute: 3 10*3/uL (ref 1.4–7.0)
Neutrophils: 60 %
Platelets: 293 10*3/uL (ref 150–450)
RBC: 5.26 x10E6/uL (ref 4.14–5.80)
RDW: 13.9 % (ref 11.6–15.4)
WBC: 5.1 10*3/uL (ref 3.4–10.8)

## 2021-07-09 LAB — MICROSCOPIC EXAMINATION
Bacteria, UA: NONE SEEN
Epithelial Cells (non renal): NONE SEEN /hpf (ref 0–10)
RBC, Urine: NONE SEEN /hpf (ref 0–2)
WBC, UA: NONE SEEN /hpf (ref 0–5)

## 2021-07-09 LAB — BAYER DCA HB A1C WAIVED: HB A1C (BAYER DCA - WAIVED): 5.8 % — ABNORMAL HIGH (ref 4.8–5.6)

## 2021-07-09 LAB — URINALYSIS, ROUTINE W REFLEX MICROSCOPIC
Bilirubin, UA: NEGATIVE
Glucose, UA: NEGATIVE
Ketones, UA: NEGATIVE
Leukocytes,UA: NEGATIVE
Nitrite, UA: NEGATIVE
RBC, UA: NEGATIVE
Specific Gravity, UA: >1.03 — ABNORMAL HIGH (ref 1.005–1.030)
Urobilinogen, Ur: 0.2 mg/dL (ref 0.2–1.0)
pH, UA: 5.5 (ref 5.0–7.5)

## 2021-07-09 NOTE — Progress Notes (Signed)
BP (!) 150/102    Pulse 60    Temp 98.2 F (36.8 C) (Oral)    Ht 5' 11.65" (1.82 m)    Wt 235 lb 9.6 oz (106.9 kg)    SpO2 99%    BMI 32.26 kg/m    Subjective:    Patient ID: Lance Vaughan, male    DOB: 1966-03-24, 56 y.o.   MRN: [id number]  Chief Complaint  Patient presents with   Abdominal Pain    Patient states that his stomach has been painful for the past 3 weeks,   Diabetes   Hypertension   Hyperlipidemia   Depression   Erectile Dysfunction    HPI: Lance Vaughan is a 56 y.o. male  Diabetes He presents for his follow-up diabetic visit. He has type 2 diabetes mellitus. Pertinent negatives for hypoglycemia include no headaches. Pertinent negatives for diabetes include no weight loss.  Abdominal Pain This is a chronic (ho diverticulitis started 3 weeks ago.) problem. The current episode started 1 to 4 weeks ago. The onset quality is gradual. The problem has been gradually worsening. The pain is at a severity of 8/10. Pertinent negatives include no anorexia, arthralgias, belching, constipation, diarrhea, dysuria, fever, flatus, frequency, headaches, hematochezia, hematuria, melena, myalgias, nausea, vomiting or weight loss.   Chief Complaint  Patient presents with   Abdominal Pain    Patient states that his stomach has been painful for the past 3 weeks,   Diabetes   Hypertension   Hyperlipidemia   Depression   Erectile Dysfunction    Relevant past medical, surgical, family and social history reviewed and updated as indicated. Interim medical history since our last visit reviewed. Allergies and medications reviewed and updated.  Review of Systems  Constitutional:  Negative for fever and weight loss.  Gastrointestinal:  Negative for anorexia, constipation, diarrhea, flatus, hematochezia, melena, nausea and vomiting.  Genitourinary:  Negative for dysuria, frequency and hematuria.  Musculoskeletal:  Negative for arthralgias and myalgias.  Neurological:  Negative for  headaches.   Per HPI unless specifically indicated above     Objective:    BP (!) 150/102    Pulse 60    Temp 98.2 F (36.8 C) (Oral)    Ht 5' 11.65" (1.82 m)    Wt 235 lb 9.6 oz (106.9 kg)    SpO2 99%    BMI 32.26 kg/m   Wt Readings from Last 3 Encounters:  07/09/21 235 lb 9.6 oz (106.9 kg)  09/10/20 241 lb 3.2 oz (109.4 kg)  02/14/20 236 lb 6.4 oz (107.2 kg)    Physical Exam Vitals and nursing note reviewed.  Constitutional:      General: He is not in acute distress.    Appearance: Normal appearance. He is not ill-appearing or diaphoretic.  HENT:     Head: Normocephalic and atraumatic.     Right Ear: Tympanic membrane and external ear normal. There is no impacted cerumen.     Left Ear: External ear normal.     Nose: No congestion or rhinorrhea.     Mouth/Throat:     Pharynx: No oropharyngeal exudate or posterior oropharyngeal erythema.  Eyes:     Conjunctiva/sclera: Conjunctivae normal.     Pupils: Pupils are equal, round, and reactive to light.  Cardiovascular:     Rate and Rhythm: Normal rate and regular rhythm.     Heart sounds: No murmur heard.   No friction rub. No gallop.  Pulmonary:     Effort: No respiratory distress.  Breath sounds: No stridor. No wheezing or rhonchi.  Chest:     Chest wall: No tenderness.  Abdominal:     General: Abdomen is flat. Bowel sounds are normal.     Palpations: Abdomen is soft. There is no mass.     Tenderness: There is abdominal tenderness in the suprapubic area and left lower quadrant.  Musculoskeletal:     Cervical back: Normal range of motion and neck supple. No rigidity or tenderness.     Left lower leg: No edema.  Skin:    General: Skin is warm and dry.  Neurological:     Mental Status: He is alert.   Results for orders placed or performed in visit on 02/14/20  Microalbumin, Urine Waived  Result Value Ref Range   Microalb, Ur Waived 30 (H) 0 - 19 mg/L   Creatinine, Urine Waived 200 10 - 300 mg/dL   Microalb/Creat  Ratio <30 <30 mg/g  Comprehensive metabolic panel  Result Value Ref Range   Glucose 128 (H) 65 - 99 mg/dL   BUN 17 6 - 24 mg/dL   Creatinine, Ser 1.21 0.76 - 1.27 mg/dL   GFR calc non Af Amer 67 >59 mL/min/1.73   GFR calc Af Amer 78 >59 mL/min/1.73   BUN/Creatinine Ratio 14 9 - 20   Sodium 143 134 - 144 mmol/L   Potassium 4.3 3.5 - 5.2 mmol/L   Chloride 108 (H) 96 - 106 mmol/L   CO2 20 20 - 29 mmol/L   Calcium 8.4 (L) 8.7 - 10.2 mg/dL   Total Protein 5.4 (L) 6.0 - 8.5 g/dL   Albumin 3.5 (L) 3.8 - 4.9 g/dL   Globulin, Total 1.9 1.5 - 4.5 g/dL   Albumin/Globulin Ratio 1.8 1.2 - 2.2   Bilirubin Total <0.2 0.0 - 1.2 mg/dL   Alkaline Phosphatase 58 48 - 121 IU/L   AST 53 (H) 0 - 40 IU/L   ALT 61 (H) 0 - 44 IU/L  HgB A1c  Result Value Ref Range   Hgb A1c MFr Bld 5.9 (H) 4.8 - 5.6 %   Est. average glucose Bld gHb Est-mCnc 123 mg/dL        Current Outpatient Medications:    ACCU-CHEK AVIVA PLUS test strip, TEST TWICE DAILY, Disp: 100 each, Rfl: 1   amLODipine (NORVASC) 10 MG tablet, Take 1 tablet (10 mg total) by mouth daily. needs appointment for further refills, Disp: 30 tablet, Rfl: 0   aspirin EC 81 MG tablet, Take 1 tablet (81 mg total) by mouth daily., Disp: 90 tablet, Rfl: 0   atorvastatin (LIPITOR) 40 MG tablet, Take 1 tablet (40 mg total) by mouth at bedtime. needs appointment for further refills, Disp: 30 tablet, Rfl: 0   dapagliflozin propanediol (FARXIGA) 5 MG TABS tablet, Take 1 tablet (5 mg total) by mouth daily. needs appointment for further refills, Disp: 30 tablet, Rfl: 0   gabapentin (NEURONTIN) 600 MG tablet, TAKE 1 TABLET BY MOUTH 3 TIMES DAILY AS NEEDED, Disp: 270 tablet, Rfl: 0   insulin glulisine (APIDRA) 100 UNIT/ML injection, Inject 0.08 mLs (8 Units total) into the skin 2 (two) times daily after a meal. Before dinner (Patient taking differently: Inject 10 Units into the skin daily with supper.), Disp: 10 mL, Rfl: 11   PROCTOZONE-HC 2.5 % rectal cream, APPLY  RECTALLY 2 TIMES A DAY, Disp: 60 g, Rfl: 1   ULTRACARE PEN NEEDLES 32G X 4 MM MISC, USE 3 TIMES DAILY AS NEEDED, Disp: 100 each, Rfl: 11  HYDROcodone-acetaminophen (NORCO/VICODIN) 5-325 MG tablet, Take 1 tablet by mouth every 6 (six) hours as needed for moderate pain. (Patient not taking: Reported on 07/09/2021), Disp: 15 tablet, Rfl: 0   ibuprofen (ADVIL) 800 MG tablet, Take 1 tablet (800 mg total) by mouth every 8 (eight) hours as needed. (Patient not taking: Reported on 07/09/2021), Disp: 30 tablet, Rfl: 0   metoprolol succinate (TOPROL-XL) 25 MG 24 hr tablet, Take 1 tablet (25 mg total) by mouth daily. needs appointment for further refills (Patient not taking: Reported on 07/09/2021), Disp: 30 tablet, Rfl: 0   Semaglutide,0.25 or 0.5MG /DOS, (OZEMPIC, 0.25 OR 0.5 MG/DOSE,) 2 MG/1.5ML SOPN, Inject 0.25 mg into the skin once a week. (Patient not taking: Reported on 07/09/2021), Disp: 1 pen, Rfl: 2    Assessment & Plan:  Abdominal pain pt will need to get a STAT CT abdomen/ pelvis Pt says he cannot go today after organizing a scan @ 12: 15 pm Pt needs to go to the ER for further mx of his abdominal pain . Advised to call the office or go to the ER if she develops chest pain , any new onset of bleeding / black stools or  fresh red blood from any orifice,  shortness of breath dizziness or tingling or numbness.  Pt verbalized understanding of such.    Problem List Items Addressed This Visit   None    No orders of the defined types were placed in this encounter.    No orders of the defined types were placed in this encounter.    Follow up plan: No follow-ups on file.

## 2021-07-10 LAB — CBC WITH DIFFERENTIAL/PLATELET
Basophils Absolute: 0 10*3/uL (ref 0.0–0.2)
Basos: 1 %
EOS (ABSOLUTE): 0.3 10*3/uL (ref 0.0–0.4)
Eos: 7 %
Hematocrit: 50 % (ref 37.5–51.0)
Hemoglobin: 17.2 g/dL (ref 13.0–17.7)
Immature Grans (Abs): 0 10*3/uL (ref 0.0–0.1)
Immature Granulocytes: 0 %
Lymphocytes Absolute: 1.6 10*3/uL (ref 0.7–3.1)
Lymphs: 33 %
MCH: 33.4 pg — ABNORMAL HIGH (ref 26.6–33.0)
MCHC: 34.4 g/dL (ref 31.5–35.7)
MCV: 97 fL (ref 79–97)
Monocytes Absolute: 0.4 10*3/uL (ref 0.1–0.9)
Monocytes: 8 %
Neutrophils Absolute: 2.5 10*3/uL (ref 1.4–7.0)
Neutrophils: 51 %
Platelets: 299 10*3/uL (ref 150–450)
RBC: 5.15 x10E6/uL (ref 4.14–5.80)
RDW: 13.3 % (ref 11.6–15.4)
WBC: 4.8 10*3/uL (ref 3.4–10.8)

## 2021-07-10 LAB — COMPREHENSIVE METABOLIC PANEL
ALT: 47 IU/L — ABNORMAL HIGH (ref 0–44)
AST: 31 IU/L (ref 0–40)
Albumin/Globulin Ratio: 1.5 (ref 1.2–2.2)
Albumin: 4.4 g/dL (ref 3.8–4.9)
Alkaline Phosphatase: 76 IU/L (ref 44–121)
BUN/Creatinine Ratio: 13 (ref 9–20)
BUN: 13 mg/dL (ref 6–24)
Bilirubin Total: <0.2 mg/dL (ref 0.0–1.2)
CO2: 19 mmol/L — ABNORMAL LOW (ref 20–29)
Calcium: 8.8 mg/dL (ref 8.7–10.2)
Chloride: 105 mmol/L (ref 96–106)
Creatinine, Ser: 0.97 mg/dL (ref 0.76–1.27)
Globulin, Total: 2.9 g/dL (ref 1.5–4.5)
Glucose: 109 mg/dL — ABNORMAL HIGH (ref 70–99)
Potassium: 4.2 mmol/L (ref 3.5–5.2)
Sodium: 140 mmol/L (ref 134–144)
Total Protein: 7.3 g/dL (ref 6.0–8.5)
eGFR: 92 mL/min/{1.73_m2} (ref >59)

## 2021-07-10 LAB — TSH: TSH: 1.27 u[IU]/mL (ref 0.450–4.500)

## 2021-07-11 LAB — URINE CULTURE: Organism ID, Bacteria: NO GROWTH

## 2021-07-17 ENCOUNTER — Other Ambulatory Visit: Payer: Self-pay | Admitting: Internal Medicine

## 2021-07-17 NOTE — Telephone Encounter (Signed)
Requested medication (s) are due for refill today: yes  Requested medication (s) are on the active medication list: yes  Last refill:  06/19/21  Future visit scheduled: 07/24/21  Notes to clinic:  Has already had a curtesy refill, was seen in office but for abd pain, does have appt 07/24/21, labs are from 11/08/2019, please assess  Requested Prescriptions  Pending Prescriptions Disp Refills   amLODipine (NORVASC) 10 MG tablet [Pharmacy Med Name: AMLODIPINE BESYLATE 10 MG TAB] 30 tablet 0    Sig: TAKE 1 TABLET BY MOUTH ONCE DAILY     Cardiovascular:  Calcium Channel Blockers Failed - 07/17/2021  4:49 PM      Failed - Last BP in normal range    BP Readings from Last 1 Encounters:  07/09/21 (!) 150/102          Passed - Valid encounter within last 6 months    Recent Outpatient Visits           1 week ago Lower abdominal pain   [place] Vigg, Avanti, MD   10 months ago Diabetes mellitus without complication ([place])   [facility] Vigg, Avanti, MD   1 year ago Type 2 diabetes mellitus with diabetic neuropathy, with long-term current use of insulin ([place])   [place] [name], NP   1 year ago Chest pain, unspecified type   [facility] [name], NP   2 years ago Hemorrhoids, unspecified hemorrhoid type   [place], Lilia Argue, [place]       Future Appointments             In 1 week Vigg, Avanti, MD [facility]             atorvastatin (LIPITOR) 40 MG tablet [Pharmacy Med Name: ATORVASTATIN CALCIUM 40 MG TAB] 30 tablet 0    Sig: TAKE 1 TABLET BY MOUTH AT BEDTIME     Cardiovascular:  Antilipid - Statins Failed - 07/17/2021  4:49 PM      Failed - Total Cholesterol in normal range and within 360 days    Cholesterol, Total  Date Value Ref Range Status  11/08/2019 149 100 - 199 mg/dL Final          Failed - LDL in normal range and within 360 days    LDL Chol  Calc (NIH)  Date Value Ref Range Status  11/08/2019 71 0 - 99 mg/dL Final          Failed - HDL in normal range and within 360 days    HDL  Date Value Ref Range Status  11/08/2019 44 >39 mg/dL Final          Failed - Triglycerides in normal range and within 360 days    Triglycerides  Date Value Ref Range Status  11/08/2019 203 (H) 0 - 149 mg/dL Final          Passed - Patient is not pregnant      Passed - Valid encounter within last 12 months    Recent Outpatient Visits           1 week ago Lower abdominal pain   [facility] Vigg, Avanti, MD   10 months ago Diabetes mellitus without complication ([place])   [facility] Vigg, Avanti, MD   1 year ago Type 2 diabetes mellitus with diabetic neuropathy, with long-term current use of insulin ([place])   [place] Noemi [place]  A, NP   1 year ago Chest pain, unspecified type   [facility] [name], NP   2 years ago Hemorrhoids, unspecified hemorrhoid type   [place], Lilia Argue, [place]       Future Appointments             In 1 week Vigg, Avanti, MD [facility]             metoprolol succinate (TOPROL-XL) 25 MG 24 hr tablet [Pharmacy Med Name: METOPROLOL SUCCINATE ER 25 MG TAB] 30 tablet 0    Sig: TAKE 1 TABLET BY MOUTH ONCE DAILY     Cardiovascular:  Beta Blockers Failed - 07/17/2021  4:49 PM      Failed - Last BP in normal range    BP Readings from Last 1 Encounters:  07/09/21 (!) 150/102          Passed - Last Heart Rate in normal range    Pulse Readings from Last 1 Encounters:  07/09/21 60          Passed - Valid encounter within last 6 months    Recent Outpatient Visits           1 week ago Lower abdominal pain   [place] Vigg, Avanti, MD   10 months ago Diabetes mellitus without complication ([place])   [facility] Vigg, Avanti, MD   1 year ago Type 2 diabetes mellitus  with diabetic neuropathy, with long-term current use of insulin ([place])   [place] [name], NP   1 year ago Chest pain, unspecified type   [facility] [name], NP   2 years ago Hemorrhoids, unspecified hemorrhoid type   [facility], [place], [place]       Future Appointments             In 1 week Vigg, Avanti, MD [facility], [facility]

## 2021-07-18 NOTE — Telephone Encounter (Signed)
Patient is overdue for appointment. Please call to schedule.  

## 2021-07-24 ENCOUNTER — Other Ambulatory Visit: Payer: Self-pay

## 2021-07-24 ENCOUNTER — Ambulatory Visit (INDEPENDENT_AMBULATORY_CARE_PROVIDER_SITE_OTHER): Payer: Medicaid Other | Admitting: Internal Medicine

## 2021-07-24 ENCOUNTER — Encounter: Payer: Self-pay | Admitting: Internal Medicine

## 2021-07-24 VITALS — BP 133/90 | HR 80 | Temp 98.0°F | Ht 71.65 in | Wt 239.8 lb

## 2021-07-24 DIAGNOSIS — E1140 Type 2 diabetes mellitus with diabetic neuropathy, unspecified: Secondary | ICD-10-CM

## 2021-07-24 DIAGNOSIS — I10 Essential (primary) hypertension: Secondary | ICD-10-CM

## 2021-07-24 DIAGNOSIS — Z794 Long term (current) use of insulin: Secondary | ICD-10-CM

## 2021-07-24 DIAGNOSIS — R062 Wheezing: Secondary | ICD-10-CM

## 2021-07-24 DIAGNOSIS — F17200 Nicotine dependence, unspecified, uncomplicated: Secondary | ICD-10-CM

## 2021-07-24 MED ORDER — ALBUTEROL SULFATE HFA 108 (90 BASE) MCG/ACT IN AERS: 2.0000 | INHALATION_SPRAY | Freq: Four times a day (QID) | RESPIRATORY_TRACT | 3 refills | Status: DC | PRN

## 2021-07-24 MED ORDER — UMECLIDINIUM-VILANTEROL 62.5-25 MCG/ACT IN AEPB: 1.0000 | INHALATION_SPRAY | Freq: Every day | RESPIRATORY_TRACT | 4 refills | Status: DC

## 2021-07-24 MED ORDER — POLYETHYLENE GLYCOL 3350 17 GM/SCOOP PO POWD: 17.0000 g | Freq: Two times a day (BID) | ORAL | 1 refills | Status: DC | PRN

## 2021-07-24 NOTE — Progress Notes (Signed)
BP 133/90    Pulse 80    Temp 98 F (36.7 C) (Oral)    Ht 5' 11.65" (1.82 m)    Wt 239 lb 12.8 oz (108.8 kg)    SpO2 98%    BMI 32.84 kg/m    Subjective:    Patient ID: Lance Vaughan, male    DOB: Mar 26, 1966, 56 y.o.   MRN: [id number]  Chief Complaint  Patient presents with   Abdominal Pain    Patient states that pain has resolved.     HPI: Lance Vaughan is a 56 y.o. male  Abdominal Pain Chronicity: pt didnt get his CT scan as he feels better and couldnt leave work. Episode onset: thinks he ate some nuts or seeds and has a ho diverticulitis and felt this was causing such. took a good laxative and helped. Pertinent negatives include no headaches.  Diabetes He presents for his follow-up (fsbs 130 -- otherwise 117 - 120 is on farxiga) diabetic visit. He has type 2 diabetes mellitus. Pertinent negatives for hypoglycemia include no headaches or sweats. Pertinent negatives for diabetes include no blurred vision and no chest pain.  Wheezing  This is a chronic problem. The current episode started more than 1 month ago. Associated symptoms include abdominal pain. Pertinent negatives include no chest pain, headaches, neck pain or shortness of breath.  Hypertension This is a chronic problem. The current episode started more than 1 year ago. The problem has been waxing and waning since onset. The problem is controlled. Pertinent negatives include no anxiety, blurred vision, chest pain, headaches, malaise/fatigue, neck pain, orthopnea, palpitations, peripheral edema, PND, shortness of breath or sweats.  Hyperlipidemia This is a chronic problem. The current episode started more than 1 month ago. The problem is controlled. Pertinent negatives include no chest pain or shortness of breath.   Chief Complaint  Patient presents with   Abdominal Pain    Patient states that pain has resolved.     Relevant past medical, surgical, family and social history reviewed and updated as indicated. Interim medical  history since our last visit reviewed. Allergies and medications reviewed and updated.  Review of Systems  Constitutional:  Negative for malaise/fatigue.  Eyes:  Negative for blurred vision.  Respiratory:  Positive for wheezing. Negative for shortness of breath.   Cardiovascular:  Negative for chest pain, palpitations, orthopnea and PND.  Gastrointestinal:  Positive for abdominal pain.  Musculoskeletal:  Negative for neck pain.  Neurological:  Negative for headaches.   Per HPI unless specifically indicated above     Objective:    BP 133/90    Pulse 80    Temp 98 F (36.7 C) (Oral)    Ht 5' 11.65" (1.82 m)    Wt 239 lb 12.8 oz (108.8 kg)    SpO2 98%    BMI 32.84 kg/m   Wt Readings from Last 3 Encounters:  07/24/21 239 lb 12.8 oz (108.8 kg)  07/09/21 235 lb 9.6 oz (106.9 kg)  09/10/20 241 lb 3.2 oz (109.4 kg)    Physical Exam Vitals and nursing note reviewed.  Constitutional:      General: He is not in acute distress.    Appearance: Normal appearance. He is not ill-appearing or diaphoretic.  HENT:     Head: Normocephalic and atraumatic.     Right Ear: Tympanic membrane and external ear normal. There is no impacted cerumen.     Left Ear: External ear normal.     Nose: No congestion or rhinorrhea.  Mouth/Throat:     Pharynx: No oropharyngeal exudate or posterior oropharyngeal erythema.  Eyes:     Conjunctiva/sclera: Conjunctivae normal.     Pupils: Pupils are equal, round, and reactive to light.  Cardiovascular:     Rate and Rhythm: Normal rate and regular rhythm.     Heart sounds: No murmur heard.   No friction rub. No gallop.  Pulmonary:     Effort: No respiratory distress.     Breath sounds: No stridor. No wheezing or rhonchi.  Chest:     Chest wall: No tenderness.  Abdominal:     General: Abdomen is flat. Bowel sounds are normal.     Palpations: Abdomen is soft. There is no mass.     Tenderness: There is no abdominal tenderness.  Musculoskeletal:     Cervical  back: Normal range of motion and neck supple. No rigidity or tenderness.     Left lower leg: No edema.  Skin:    General: Skin is warm and dry.  Neurological:     Mental Status: He is alert.    Results for orders placed or performed in visit on 07/09/21  Urine Culture   Specimen: Urine   UR  Result Value Ref Range   Urine Culture, Routine Final report    Organism ID, Bacteria No growth   Microscopic Examination   Urine  Result Value Ref Range   WBC, UA None seen 0 - 5 /hpf   RBC None seen 0 - 2 /hpf   Epithelial Cells (non renal) None seen 0 - 10 /hpf   Mucus, UA Present (A) Not Estab.   Bacteria, UA None seen None seen/Few  CBC with Differential/Platelet  Result Value Ref Range   WBC 4.8 3.4 - 10.8 x10E3/uL   RBC 5.15 4.14 - 5.80 x10E6/uL   Hemoglobin 17.2 13.0 - 17.7 g/dL   Hematocrit 50.0 37.5 - 51.0 %   MCV 97 79 - 97 fL   MCH 33.4 (H) 26.6 - 33.0 pg   MCHC 34.4 31.5 - 35.7 g/dL   RDW 13.3 11.6 - 15.4 %   Platelets 299 150 - 450 x10E3/uL   Neutrophils 51 Not Estab. %   Lymphs 33 Not Estab. %   Monocytes 8 Not Estab. %   Eos 7 Not Estab. %   Basos 1 Not Estab. %   Neutrophils Absolute 2.5 1.4 - 7.0 x10E3/uL   Lymphocytes Absolute 1.6 0.7 - 3.1 x10E3/uL   Monocytes Absolute 0.4 0.1 - 0.9 x10E3/uL   EOS (ABSOLUTE) 0.3 0.0 - 0.4 x10E3/uL   Basophils Absolute 0.0 0.0 - 0.2 x10E3/uL   Immature Granulocytes 0 Not Estab. %   Immature Grans (Abs) 0.0 0.0 - 0.1 x10E3/uL  Comprehensive metabolic panel  Result Value Ref Range   Glucose 109 (H) 70 - 99 mg/dL   BUN 13 6 - 24 mg/dL   Creatinine, Ser 0.97 0.76 - 1.27 mg/dL   eGFR 92 >59 mL/min/1.73   BUN/Creatinine Ratio 13 9 - 20   Sodium 140 134 - 144 mmol/L   Potassium 4.2 3.5 - 5.2 mmol/L   Chloride 105 96 - 106 mmol/L   CO2 19 (L) 20 - 29 mmol/L   Calcium 8.8 8.7 - 10.2 mg/dL   Total Protein 7.3 6.0 - 8.5 g/dL   Albumin 4.4 3.8 - 4.9 g/dL   Globulin, Total 2.9 1.5 - 4.5 g/dL   Albumin/Globulin Ratio 1.5 1.2 - 2.2    Bilirubin Total <0.2 0.0 - 1.2 mg/dL  Alkaline Phosphatase 76 44 - 121 IU/L   AST 31 0 - 40 IU/L   ALT 47 (H) 0 - 44 IU/L  TSH  Result Value Ref Range   TSH 1.270 0.450 - 4.500 uIU/mL  Bayer DCA Hb A1c Waived  Result Value Ref Range   HB A1C (BAYER DCA - WAIVED) 5.8 (H) 4.8 - 5.6 %  Urinalysis, Routine w reflex microscopic  Result Value Ref Range   Specific Gravity, UA >1.030 (H) 1.005 - 1.030   pH, UA 5.5 5.0 - 7.5   Color, UA Yellow Yellow   Appearance Ur Clear Clear   Leukocytes,UA Negative Negative   Protein,UA 1+ (A) Negative/Trace   Glucose, UA Negative Negative   Ketones, UA Negative Negative   RBC, UA Negative Negative   Bilirubin, UA Negative Negative   Urobilinogen, Ur 0.2 0.2 - 1.0 mg/dL   Nitrite, UA Negative Negative   Microscopic Examination See below:   CBC With Differential/Platelet  Result Value Ref Range   WBC 5.1 3.4 - 10.8 x10E3/uL   RBC 5.26 4.14 - 5.80 x10E6/uL   Hemoglobin 17.3 13.0 - 17.7 g/dL   Hematocrit 49.6 37.5 - 51.0 %   MCV 94 79 - 97 fL   MCH 32.9 26.6 - 33.0 pg   MCHC 34.9 31.5 - 35.7 g/dL   RDW 13.9 11.6 - 15.4 %   Platelets 293 150 - 450 x10E3/uL   Neutrophils 60 Not Estab. %   Lymphs 32 Not Estab. %   MID 8 Not Estab. %   Neutrophils Absolute 3.0 1.4 - 7.0 x10E3/uL   Lymphocytes Absolute 1.7 0.7 - 3.1 x10E3/uL   MID (Absolute) 0.4 0.1 - 1.6 X10E3/uL        Current Outpatient Medications:    ACCU-CHEK AVIVA PLUS test strip, TEST TWICE DAILY, Disp: 100 each, Rfl: 1   albuterol (VENTOLIN HFA) 108 (90 Base) MCG/ACT inhaler, Inhale 2 puffs into the lungs every 6 (six) hours as needed for wheezing or shortness of breath. Rescue inhaler, Disp: 8 g, Rfl: 3   amLODipine (NORVASC) 10 MG tablet, TAKE 1 TABLET BY MOUTH ONCE DAILY, Disp: 30 tablet, Rfl: 0   aspirin EC 81 MG tablet, Take 1 tablet (81 mg total) by mouth daily., Disp: 90 tablet, Rfl: 0   atorvastatin (LIPITOR) 40 MG tablet, TAKE 1 TABLET BY MOUTH AT BEDTIME, Disp: 30  tablet, Rfl: 0   dapagliflozin propanediol (FARXIGA) 5 MG TABS tablet, Take 1 tablet (5 mg total) by mouth daily. needs appointment for further refills, Disp: 30 tablet, Rfl: 0   gabapentin (NEURONTIN) 600 MG tablet, TAKE 1 TABLET BY MOUTH 3 TIMES DAILY AS NEEDED, Disp: 270 tablet, Rfl: 0   ibuprofen (ADVIL) 800 MG tablet, Take 1 tablet (800 mg total) by mouth every 8 (eight) hours as needed., Disp: 30 tablet, Rfl: 0   metoprolol succinate (TOPROL-XL) 25 MG 24 hr tablet, TAKE 1 TABLET BY MOUTH ONCE DAILY, Disp: 30 tablet, Rfl: 0   polyethylene glycol powder (GLYCOLAX/MIRALAX) 17 GM/SCOOP powder, Take 17 g by mouth 2 (two) times daily as needed., Disp: 3350 g, Rfl: 1   PROCTOZONE-HC 2.5 % rectal cream, APPLY RECTALLY 2 TIMES A DAY, Disp: 60 g, Rfl: 1   ULTRACARE PEN NEEDLES 32G X 4 MM MISC, USE 3 TIMES DAILY AS NEEDED, Disp: 100 each, Rfl: 11   umeclidinium-vilanterol (ANORO ELLIPTA) 62.5-25 MCG/ACT AEPB, Inhale 1 puff into the lungs daily at 6 (six) AM., Disp: 1 each, Rfl: 4  Assessment & Plan:  COPD ? Needs PFTs Start anoro  Albuterol for prn use   2. DM  Continue farxiga a1c at 5.8 check HbA1c,  urine  microalbumin  diabetic diet plan given to pt  adviced regarding hypoglycemia and instructions given to pt today on how to prevent and treat the same if it were to occur. pt acknowledges the plan and voices understanding of the same.  exercise plan given and encouraged.   advice diabetic yearly podiatry, ophthalmology , nutritionist , dental check q 6 months,   3. HLD is on lipitor for such recheck FLP, check LFT's work on diet, SE of meds explained to pt. low fat and high fiber diet explained to pt.   4. HTN HTN :  Continue current meds.  Medication compliance emphasised. pt advised to keep Bp logs. Pt verbalised understanding of the same. Pt to have a low salt diet . Exercise to reach a goal of at least 150 mins a week.  lifestyle modifications explained and pt understands  importance of the above. Under good control on current regimen. Continue current regimen. Continue to monitor. Call with any concerns. Refills given. Labs drawn today.  Problem List Items Addressed This Visit       Cardiovascular and Mediastinum   Hypertension     Endocrine   Type 2 diabetes mellitus with diabetic neuropathy, unspecified ([place]) - Primary     Other   Tobacco use disorder   Other Visit Diagnoses     Wheezing       Relevant Orders   Ambulatory referral to Pulmonology        Orders Placed This Encounter  Procedures   Ambulatory referral to Pulmonology     Meds ordered this encounter  Medications   polyethylene glycol powder (GLYCOLAX/MIRALAX) 17 GM/SCOOP powder    Sig: Take 17 g by mouth 2 (two) times daily as needed.    Dispense:  3350 g    Refill:  1   umeclidinium-vilanterol (ANORO ELLIPTA) 62.5-25 MCG/ACT AEPB    Sig: Inhale 1 puff into the lungs daily at 6 (six) AM.    Dispense:  1 each    Refill:  4   albuterol (VENTOLIN HFA) 108 (90 Base) MCG/ACT inhaler    Sig: Inhale 2 puffs into the lungs every 6 (six) hours as needed for wheezing or shortness of breath. Rescue inhaler    Dispense:  8 g    Refill:  3     Follow up plan: Return in about 3 months (around 10/21/2021).

## 2021-08-01 ENCOUNTER — Ambulatory Visit: Payer: Medicaid Other | Admitting: Podiatry

## 2021-08-15 ENCOUNTER — Ambulatory Visit: Payer: Medicaid Other | Admitting: Podiatry

## 2021-08-15 ENCOUNTER — Other Ambulatory Visit: Payer: Self-pay | Admitting: Internal Medicine

## 2021-08-15 NOTE — Telephone Encounter (Signed)
Requested Prescriptions  Pending Prescriptions Disp Refills   ACCU-CHEK AVIVA PLUS test strip [Pharmacy Med Name: ACCU-CHEK AVIVA PLUS STRIP] 100 each 1    Sig: TEST TWICE DAILY     Endocrinology: Diabetes - Testing Supplies Passed - 08/15/2021 12:47 PM      Passed - Valid encounter within last 12 months    Recent Outpatient Visits          3 weeks ago Type 2 diabetes mellitus with diabetic neuropathy, with long-term current use of insulin (HCC)   [facility] Vigg, Avanti, MD   1 month ago Lower abdominal pain   [facility] Vigg, Avanti, MD   11 months ago Diabetes mellitus without complication (HCC)   [facility] Vigg, Avanti, MD   1 year ago Type 2 diabetes mellitus with diabetic neuropathy, with long-term current use of insulin (HCC)   [facility] Valentino Nose, NP   1 year ago Chest pain, unspecified type   [facility] Valentino Nose, NP      Future Appointments            In 2 months Vigg, Avanti, MD [facility]

## 2021-08-19 ENCOUNTER — Ambulatory Visit: Payer: Medicaid Other | Admitting: Podiatry

## 2021-08-30 ENCOUNTER — Ambulatory Visit
Admission: RE | Admit: 2021-08-30 | Discharge: 2021-08-30 | Disposition: A | Discharge status: Home / Self Care | Payer: Medicaid Other | Source: Ambulatory Visit | Attending: Internal Medicine | Admitting: Internal Medicine

## 2021-08-30 ENCOUNTER — Other Ambulatory Visit: Payer: Self-pay

## 2021-08-30 ENCOUNTER — Ambulatory Visit (INDEPENDENT_AMBULATORY_CARE_PROVIDER_SITE_OTHER): Payer: Medicaid Other | Admitting: Internal Medicine

## 2021-08-30 ENCOUNTER — Encounter: Payer: Self-pay | Admitting: Internal Medicine

## 2021-08-30 DIAGNOSIS — R0609 Other forms of dyspnea: Secondary | ICD-10-CM | POA: Diagnosis not present

## 2021-08-30 DIAGNOSIS — F17210 Nicotine dependence, cigarettes, uncomplicated: Secondary | ICD-10-CM

## 2021-08-30 NOTE — Assessment & Plan Note (Addendum)
Counseled re importance of smoking cessation but did not meet time criteria for separate billing   ? ? ?Low-dose CT lung cancer screening is recommended for patients who ?are 21-56 years of age with a 20+ pack-year history of smoking and ?who are currently smoking or quit <=15 years ago. ?No coughing up blood  ?No unintentional weight loss of > 15 pounds in the last 6 months  ?>>> eligible for screening, will discuss on return  ? ? ?Each maintenance medication was reviewed in detail including emphasizing most importantly the difference between maintenance and prns and under what circumstances the prns are to be triggered using an action plan format where appropriate. ? ?Total time for H and P, chart review, counseling,  directly observing portions of ambulatory 02 saturation study/ and generating customized AVS unique to this office visit / same day charting = 60 min  ?     ?  ?       ? ? ?

## 2021-08-30 NOTE — Progress Notes (Signed)
? ?Lance Vaughan, male    DOB: 11-12-1965  MRN: [id number] ? ? ?Brief patient profile:  ?[id number]  yobm defensive lineman [employer]   active smoker   [facility]  08/30/2021 by Dr Lance Vaughan  for wheezing/sob.      ? ? ? ? ?History of Present Illness  ?08/30/2021  Pulmonary/ 1st office eval/ [facility]  ?No chief complaint on file. ?Dyspnea:  around dec 2022 flat surface has to slow down / also slowed down by DM  neuropathy  ?Cough: slt smoker's rattle  ?Sleep: does not feel rested but no daytime hypersomnolence over baseline  ?SABA use: none / there was rec for anoro says never picked up rx  ? ?No obvious day to day or daytime variability or assoc excess/ purulent sputum or mucus plugs or hemoptysis or cp or chest tightness,   overt sinus or hb symptoms.  ? ?Sleeping  without nocturnal  or early am exacerbation  of respiratory  c/o's or need for noct saba. Also denies any obvious fluctuation of symptoms with weather or environmental changes or other aggravating or alleviating factors except as outlined above  ? ?No unusual exposure hx or h/o childhood pna/ asthma or knowledge of premature birth. ? ?Current Allergies, Complete Past Medical History, Past Surgical History, Family History, and Social History were reviewed in [facility] electronic medical record. ? ?ROS  The following are not active complaints unless bolded ?Hoarseness, sore throat, dysphagia, dental problems, itching, sneezing,  nasal congestion or discharge of excess mucus or purulent secretions, ear ache,   fever, chills, sweats, unintended wt loss or wt gain, classically pleuritic or exertional cp,  orthopnea pnd or arm/hand swelling  or leg swelling, presyncope, palpitations, abdominal pain, anorexia, nausea, vomiting, diarrhea  or change in bowel habits or change in bladder habits, change in stools or change in urine, dysuria, hematuria,  rash, arthralgias, visual complaints, headache, numbness, weakness or  ataxia or problems with walking or coordination,  change in mood or  memory. ?      ?   ? ?Past Medical History:  ?Diagnosis Date  ? Arthritis   ? Bipolar disorder (HCC)   ? Bursitis   ? Depression   ? Diabetes mellitus without complication (HCC)   ? Diverticulitis   ? Hyperlipidemia   ? Hypertension   ? Psychosis (HCC)   ? Psychosis (HCC)   ? ? ?Outpatient Medications Prior to Visit  - - NOTE:   Unable to verify as accurately reflecting what pt takes    ?Medication Sig Dispense Refill  ? ACCU-CHEK AVIVA PLUS test strip TEST TWICE DAILY 100 each 1  ? albuterol (VENTOLIN HFA) 108 (90 Base) MCG/ACT inhaler Inhale 2 puffs into the lungs every 6 (six) hours as needed for wheezing or shortness of breath. Rescue inhaler 8 g 3  ? amLODipine (NORVASC) 10 MG tablet TAKE 1 TABLET BY MOUTH ONCE DAILY 30 tablet 0  ? aspirin EC 81 MG tablet Take 1 tablet (81 mg total) by mouth daily. 90 tablet 0  ? atorvastatin (LIPITOR) 40 MG tablet TAKE 1 TABLET BY MOUTH AT BEDTIME 30 tablet 0  ? dapagliflozin propanediol (FARXIGA) 5 MG TABS tablet Take 1 tablet (5 mg total) by mouth daily. needs appointment for further refills 30 tablet 0  ? gabapentin (NEURONTIN) 600 MG tablet TAKE 1 TABLET BY MOUTH 3 TIMES DAILY AS NEEDED 270 tablet 0  ? ibuprofen (ADVIL) 800 MG tablet Take 1 tablet (  800 mg total) by mouth every 8 (eight) hours as needed. 30 tablet 0  ? metoprolol succinate (TOPROL-XL) 25 MG 24 hr tablet TAKE 1 TABLET BY MOUTH ONCE DAILY 30 tablet 0  ? polyethylene glycol powder (GLYCOLAX/MIRALAX) 17 GM/SCOOP powder Take 17 g by mouth 2 (two) times daily as needed. 3350 g 1  ? PROCTOZONE-HC 2.5 % rectal cream APPLY RECTALLY 2 TIMES A DAY 60 g 1  ? ULTRACARE PEN NEEDLES 32G X 4 MM MISC USE 3 TIMES DAILY AS NEEDED 100 each 11  ?     4  ?  ? ? ?Objective:  ?  ? ?BP 120/84 (BP Location: Left Arm, Patient Position: Sitting, Cuff Size: Normal)   Pulse 69   Temp (!) 97.1 ?F (36.2 ?C) (Oral)   Ht 6' (1.829 m)   Wt 242 lb 12.8 oz (110.1 kg)    SpO2 99%   BMI 32.93 kg/m?  ? ?SpO2: 99 %  ? ?Wt Readings from Last 3 Encounters:  ?08/30/21 242 lb 12.8 oz (110.1 kg)  ?07/24/21 239 lb 12.8 oz (108.8 kg)  ?07/09/21 235 lb 9.6 oz (106.9 kg)  ? 43/21/22        241  ? ? ?Vital signs reviewed  08/30/2021  - Note at rest 02 sats  99% on RA  ? ?General appearance:    amb moderately obese bm nad    ? ?Amb bm nad / smoker's rattle  ? ? HEENT : pt wearing mask not removed for exam due to covid -19 concerns.  ? ? ?NECK :  without JVD/Nodes/TM/ nl carotid upstrokes bilaterally ? ? ?LUNGS: no acc muscle use,  Nl contour chest which is clear to A and P bilaterally without cough on insp or exp maneuvers ? ? ?CV:  RRR  no s3 or murmur or increase in P2, and no edema  ? ?ABD:  soft and nontender with nl inspiratory excursion in the supine position. No bruits or organomegaly appreciated, bowel sounds nl ? ?MS:  slt awkard gait/ ext warm without deformities, calf tenderness, cyanosis or clubbing ?No obvious joint restrictions  ? ?SKIN: warm and dry without lesions   ? ?NEURO:  alert, approp, nl sensorium with  no motor or cerebellar deficits apparent.  ? ? ?CXR PA and Lateral:   08/30/2021 :    ?I personally reviewed images and agree with radiology impression as follows:    ?No active cardiopulmonary disease. ? ?Labs:  did not go to labs as rec  ? ?Labs   reviewed:  ? ? ?  Chemistry   ?   ?Component Value Date/Time  ? NA 140 07/09/2021 1035  ? K 4.2 07/09/2021 1035  ? CL 105 07/09/2021 1035  ? CO2 19 (L) 07/09/2021 1035  ? BUN 13 07/09/2021 1035  ? CREATININE 0.97 07/09/2021 1035  ?    ?Component Value Date/Time  ? CALCIUM 8.8 07/09/2021 1035  ? ALKPHOS 76 07/09/2021 1035  ? AST 31 07/09/2021 1035  ? ALT 47 (H) 07/09/2021 1035  ? BILITOT <0.2 07/09/2021 1035  ?  ? ?  ? ?Lab Results  ?Component Value Date  ? WBC 4.8 07/09/2021  ? HGB 17.2 07/09/2021  ? HCT 50.0 07/09/2021  ? MCV 97 07/09/2021  ? PLT 299 07/09/2021  ?     EOS  0.3                                     07/09/21  ?  ?  ?Lab Results  ?Component Value Date  ? TSH 1.270 07/09/2021  ?  ? ?No results found for: PROBNP  ? ? No results found for: ESRSEDRATE  ?   ?   ?Assessment  ? ?No problem-specific Assessment & Plan notes found for this encounter. ? ? ? ? ?Sandrea Hughs, MD ?08/30/2021 ?    ?

## 2021-08-30 NOTE — Patient Instructions (Addendum)
The key is to stop smoking completely before smoking completely stops you! ? ?Please remember to go to the lab and x-ray department  for your tests - we will call you with the results when they are available. ?     ? ?We will schedule a lung function test next available  ?   ?

## 2021-08-31 ENCOUNTER — Encounter: Payer: Self-pay | Admitting: Internal Medicine

## 2021-08-31 NOTE — Assessment & Plan Note (Addendum)
Active  smoker ?- Onset around dec 2022  ?- 08/30/2021   Walked on RA  x  1  lap(s) =  approx 175  ft  @ nl pace, stopped due to sob with lowest 02 sats 100%  ? ?Symptoms are markedly disproportionate to objective findings and not clear to what extent this is actually a pulmonary  problem but pt does appear to have difficult to sort out respiratory symptoms of unknown origin for which  DDX  = almost all start with A and  include Adherence, Ace Inhibitors, Acid Reflux, Active Sinus Disease, Alpha 1 Antitripsin deficiency, Anxiety masquerading as Airways dz,  ABPA,  Allergy(esp in young), Aspiration (esp in elderly), Adverse effects of meds,  Active smoking or Vaping, A bunch of PE's/clot burden (a few small clots can't cause this syndrome unless there is already severe underlying pulm or vascular dz with poor reserve),  Anemia or thyroid disorder, plus two Bs  = Bronchiectasis and Beta blocker use..and one C= CHF  ?  ? ?Adherence is always the initial "prime suspect" and is a multilayered concern that requires a "trust but verify" approach in every patient - starting with knowing how to use medications, especially inhalers, correctly, keeping up with refills and understanding the fundamental difference between maintenance and prns vs those medications only taken for a very short course and then stopped and not refilled.  ? ?Advised:  Return with all meds in hand using a trust but verify approach to confirm accurate Medication  Reconciliation The principal here is that until we are certain that the  patients are doing what we've asked, it makes no sense to ask them to do more.  ? ?Active smoking > see sep a/p  ? ?? Adverse drug effects:  None of the usual suspects (eg ACEi ) listed ? ?? Allergy/asthma > nothing to suggest, could be developing copd needs pfts next  ? ?? Alpha one > extremely rare in AA pts ? ?? Anemia/ thyroid dz/metabolic derangement  ?  ?Lab Results  ?Component Value Date  ? HGB 17.2 07/09/2021  ? HGB  17.3 07/09/2021  ? HGB 17.0 11/08/2019  ?  ?Lab Results  ?Component Value Date  ? TSH 1.270 07/09/2021  ?  ?Bicarb level concerning ? RTA  Related to dm > return to lab to recheck bmet  ? ?? A bunch of Pe's > check d dimer > advised to return to lab ? ?? Anxiety / hyperventilation could drive down the bicarb but would be unusual just to have this with exertion  ? ?? CHF > check BNP > advised to return to lab ? ? ? ?>>> advised to return to complete the w/u ?

## 2021-09-02 ENCOUNTER — Ambulatory Visit: Payer: Medicaid Other | Admitting: Podiatry

## 2021-09-30 ENCOUNTER — Other Ambulatory Visit: Payer: Self-pay | Admitting: Internal Medicine

## 2021-09-30 ENCOUNTER — Telehealth: Payer: Self-pay | Admitting: Internal Medicine

## 2021-09-30 NOTE — Telephone Encounter (Signed)
I don't see that he's been on this medicine in 2 years. Has he been taking it? Otherwise needs appointment to get restarted.  ?

## 2021-09-30 NOTE — Telephone Encounter (Signed)
Routing to provider to advise on medication.

## 2021-09-30 NOTE — Telephone Encounter (Signed)
Pt called and stated that his sugar is going back up and can not keep it back down and would like ozempic called back in.  ? ?Sugar has been 176. Has almost reach 200.  ? ?Please advise  ? ?[facility] - [place], [place] - [address] Phone:  [phone]  ?Fax:  [phone]  ?  ? ?

## 2021-10-01 NOTE — Telephone Encounter (Signed)
Spoke with patient to notified him of Dr.Johnson's recommendations. Patient states he spoke with someone and was provided the recommendations. Patient is scheduled to see provider on 10/08/21. Patient verbalized understanding and has no further questions at this time.  ?

## 2021-10-01 NOTE — Telephone Encounter (Signed)
Requested Prescriptions  ?Pending Prescriptions Disp Refills  ?? gabapentin (NEURONTIN) 600 MG tablet [Pharmacy Med Name: GABAPENTIN 600 MG TAB] 270 tablet 0  ?  Sig: TAKE 1 TABLET BY MOUTH 3 TIMES DAILY AS NEEDED  ?  ? Neurology: Anticonvulsants - gabapentin Failed - 09/30/2021 10:32 AM  ?  ?  Failed - Completed PHQ-2 or PHQ-9 in the last 360 days  ?  ?  Passed - Cr in normal range and within 360 days  ?  Creatinine, Ser  ?Date Value Ref Range Status  ?07/09/2021 0.97 0.76 - 1.27 mg/dL Final  ?   ?  ?  Passed - Valid encounter within last 12 months  ?  Recent Outpatient Visits   ?      ? 2 months ago Type 2 diabetes mellitus with diabetic neuropathy, with long-term current use of insulin (HCC)  ? [facility] Vigg, Avanti, MD  ? 2 months ago Lower abdominal pain  ? [facility] Vigg, Avanti, MD  ? 1 year ago Diabetes mellitus without complication (HCC)  ? [facility] Vigg, Avanti, MD  ? 1 year ago Type 2 diabetes mellitus with diabetic neuropathy, with long-term current use of insulin (HCC)  ? [facility] Cathlean Marseilles A, NP  ? 1 year ago Chest pain, unspecified type  ? [facility] Valentino Nose, NP  ?  ?  ?Future Appointments   ?        ? In 1 week Vigg, Avanti, MD [facility]  ? In 2 weeks Vigg, Avanti, MD [facility]  ?  ? ?  ?  ?  ? ? ?

## 2021-10-02 ENCOUNTER — Encounter: Payer: Self-pay | Admitting: Internal Medicine

## 2021-10-08 ENCOUNTER — Ambulatory Visit (INDEPENDENT_AMBULATORY_CARE_PROVIDER_SITE_OTHER): Payer: Medicaid Other | Admitting: Internal Medicine

## 2021-10-08 ENCOUNTER — Encounter: Payer: Self-pay | Admitting: Internal Medicine

## 2021-10-08 VITALS — BP 146/82 | HR 90 | Temp 97.8°F | Ht 72.01 in | Wt 235.6 lb

## 2021-10-08 DIAGNOSIS — Z794 Long term (current) use of insulin: Secondary | ICD-10-CM

## 2021-10-08 DIAGNOSIS — E782 Mixed hyperlipidemia: Secondary | ICD-10-CM

## 2021-10-08 DIAGNOSIS — I10 Essential (primary) hypertension: Secondary | ICD-10-CM | POA: Diagnosis not present

## 2021-10-08 DIAGNOSIS — E1140 Type 2 diabetes mellitus with diabetic neuropathy, unspecified: Secondary | ICD-10-CM

## 2021-10-08 LAB — MICROALBUMIN, URINE WAIVED
Creatinine, Urine Waived: 300 mg/dL (ref 10–300)
Microalb, Ur Waived: 80 mg/L — ABNORMAL HIGH (ref 0–19)

## 2021-10-08 LAB — BAYER DCA HB A1C WAIVED: HB A1C (BAYER DCA - WAIVED): 5.2 % (ref 4.8–5.6)

## 2021-10-08 MED ORDER — OZEMPIC (0.25 OR 0.5 MG/DOSE) 2 MG/1.5ML ~~LOC~~ SOPN
0.5000 mg | PEN_INJECTOR | SUBCUTANEOUS | 6 refills | Status: DC
Start: 2021-10-08 — End: 2022-05-27

## 2021-10-08 NOTE — Progress Notes (Signed)
? ?BP (!) 146/82   Pulse 90   Temp 97.8 ?F (36.6 ?C) (Oral)   Ht 6' 0.01" (1.829 m)   Wt 235 lb 9.6 oz (106.9 kg)   SpO2 97%   BMI 31.95 kg/m?   ? ?Subjective:  ? ? Patient ID: Donyel Nester, male    DOB: February 15, 1966, 56 y.o.   MRN: [id number] ? ?Chief Complaint  ?Patient presents with  ? Diabetes  ?  Wants to start Ozempic again. Stopped taking because he thought the ozempic cured him.  ? ? ?HPI: ?Travontae Freiberger is a 56 y.o. male ? ?Diabetes ?He presents for his follow-up (fsbs 200 's now stopped tkaing ozempic x  2 yrs ago thinkgin it cured his diabetes/ he would like it back now since x 1 month) diabetic visit. He has type 2 diabetes mellitus.  ? ?Chief Complaint  ?Patient presents with  ? Diabetes  ?  Wants to start Ozempic again. Stopped taking because he thought the ozempic cured him.  ? ? ?Relevant past medical, surgical, family and social history reviewed and updated as indicated. Interim medical history since our last visit reviewed. ?Allergies and medications reviewed and updated. ? ?Review of Systems ? ?Per HPI unless specifically indicated above ? ?   ?Objective:  ?  ?BP (!) 146/82   Pulse 90   Temp 97.8 ?F (36.6 ?C) (Oral)   Ht 6' 0.01" (1.829 m)   Wt 235 lb 9.6 oz (106.9 kg)   SpO2 97%   BMI 31.95 kg/m?   ?Wt Readings from Last 3 Encounters:  ?10/08/21 235 lb 9.6 oz (106.9 kg)  ?08/30/21 242 lb 12.8 oz (110.1 kg)  ?07/24/21 239 lb 12.8 oz (108.8 kg)  ?  ?Physical Exam ? ?Results for orders placed or performed in visit on 07/09/21  ?Urine Culture  ? Specimen: Urine  ? UR  ?Result Value Ref Range  ? Urine Culture, Routine Final report   ? Organism ID, Bacteria No growth   ?Microscopic Examination  ? Urine  ?Result Value Ref Range  ? WBC, UA None seen 0 - 5 /hpf  ? RBC None seen 0 - 2 /hpf  ? Epithelial Cells (non renal) None seen 0 - 10 /hpf  ? Mucus, UA Present (A) Not Estab.  ? Bacteria, UA None seen None seen/Few  ?CBC with Differential/Platelet  ?Result Value Ref Range  ? WBC 4.8 3.4 - 10.8  x10E3/uL  ? RBC 5.15 4.14 - 5.80 x10E6/uL  ? Hemoglobin 17.2 13.0 - 17.7 g/dL  ? Hematocrit 50.0 37.5 - 51.0 %  ? MCV 97 79 - 97 fL  ? MCH 33.4 (H) 26.6 - 33.0 pg  ? MCHC 34.4 31.5 - 35.7 g/dL  ? RDW 13.3 11.6 - 15.4 %  ? Platelets 299 150 - 450 x10E3/uL  ? Neutrophils 51 Not Estab. %  ? Lymphs 33 Not Estab. %  ? Monocytes 8 Not Estab. %  ? Eos 7 Not Estab. %  ? Basos 1 Not Estab. %  ? Neutrophils Absolute 2.5 1.4 - 7.0 x10E3/uL  ? Lymphocytes Absolute 1.6 0.7 - 3.1 x10E3/uL  ? Monocytes Absolute 0.4 0.1 - 0.9 x10E3/uL  ? EOS (ABSOLUTE) 0.3 0.0 - 0.4 x10E3/uL  ? Basophils Absolute 0.0 0.0 - 0.2 x10E3/uL  ? Immature Granulocytes 0 Not Estab. %  ? Immature Grans (Abs) 0.0 0.0 - 0.1 x10E3/uL  ?Comprehensive metabolic panel  ?Result Value Ref Range  ? Glucose 109 (H) 70 - 99 mg/dL  ? BUN  13 6 - 24 mg/dL  ? Creatinine, Ser 0.97 0.76 - 1.27 mg/dL  ? eGFR 92 >59 mL/min/1.73  ? BUN/Creatinine Ratio 13 9 - 20  ? Sodium 140 134 - 144 mmol/L  ? Potassium 4.2 3.5 - 5.2 mmol/L  ? Chloride 105 96 - 106 mmol/L  ? CO2 19 (L) 20 - 29 mmol/L  ? Calcium 8.8 8.7 - 10.2 mg/dL  ? Total Protein 7.3 6.0 - 8.5 g/dL  ? Albumin 4.4 3.8 - 4.9 g/dL  ? Globulin, Total 2.9 1.5 - 4.5 g/dL  ? Albumin/Globulin Ratio 1.5 1.2 - 2.2  ? Bilirubin Total <0.2 0.0 - 1.2 mg/dL  ? Alkaline Phosphatase 76 44 - 121 IU/L  ? AST 31 0 - 40 IU/L  ? ALT 47 (H) 0 - 44 IU/L  ?TSH  ?Result Value Ref Range  ? TSH 1.270 0.450 - 4.500 uIU/mL  ?Bayer DCA Hb A1c Waived  ?Result Value Ref Range  ? HB A1C (BAYER DCA - WAIVED) 5.8 (H) 4.8 - 5.6 %  ?Urinalysis, Routine w reflex microscopic  ?Result Value Ref Range  ? Specific Gravity, UA >1.030 (H) 1.005 - 1.030  ? pH, UA 5.5 5.0 - 7.5  ? Color, UA Yellow Yellow  ? Appearance Ur Clear Clear  ? Leukocytes,UA Negative Negative  ? Protein,UA 1+ (A) Negative/Trace  ? Glucose, UA Negative Negative  ? Ketones, UA Negative Negative  ? RBC, UA Negative Negative  ? Bilirubin, UA Negative Negative  ? Urobilinogen, Ur 0.2 0.2 - 1.0 mg/dL   ? Nitrite, UA Negative Negative  ? Microscopic Examination See below:   ?CBC With Differential/Platelet  ?Result Value Ref Range  ? WBC 5.1 3.4 - 10.8 x10E3/uL  ? RBC 5.26 4.14 - 5.80 x10E6/uL  ? Hemoglobin 17.3 13.0 - 17.7 g/dL  ? Hematocrit 49.6 37.5 - 51.0 %  ? MCV 94 79 - 97 fL  ? MCH 32.9 26.6 - 33.0 pg  ? MCHC 34.9 31.5 - 35.7 g/dL  ? RDW 13.9 11.6 - 15.4 %  ? Platelets 293 150 - 450 x10E3/uL  ? Neutrophils 60 Not Estab. %  ? Lymphs 32 Not Estab. %  ? MID 8 Not Estab. %  ? Neutrophils Absolute 3.0 1.4 - 7.0 x10E3/uL  ? Lymphocytes Absolute 1.7 0.7 - 3.1 x10E3/uL  ? MID (Absolute) 0.4 0.1 - 1.6 X10E3/uL  ? ?   ? ? ?Current Outpatient Medications:  ?  ACCU-CHEK AVIVA PLUS test strip, TEST TWICE DAILY, Disp: 100 each, Rfl: 1 ?  albuterol (VENTOLIN HFA) 108 (90 Base) MCG/ACT inhaler, Inhale 2 puffs into the lungs every 6 (six) hours as needed for wheezing or shortness of breath. Rescue inhaler, Disp: 8 g, Rfl: 3 ?  amLODipine (NORVASC) 10 MG tablet, TAKE 1 TABLET BY MOUTH ONCE DAILY, Disp: 30 tablet, Rfl: 0 ?  aspirin EC 81 MG tablet, Take 1 tablet (81 mg total) by mouth daily., Disp: 90 tablet, Rfl: 0 ?  atorvastatin (LIPITOR) 40 MG tablet, TAKE 1 TABLET BY MOUTH AT BEDTIME, Disp: 30 tablet, Rfl: 0 ?  dapagliflozin propanediol (FARXIGA) 5 MG TABS tablet, Take 1 tablet (5 mg total) by mouth daily. needs appointment for further refills, Disp: 30 tablet, Rfl: 0 ?  gabapentin (NEURONTIN) 600 MG tablet, TAKE 1 TABLET BY MOUTH 3 TIMES DAILY AS NEEDED, Disp: 270 tablet, Rfl: 0 ?  ibuprofen (ADVIL) 800 MG tablet, Take 1 tablet (800 mg total) by mouth every 8 (eight) hours as needed., Disp: 30 tablet,  Rfl: 0 ?  metoprolol succinate (TOPROL-XL) 25 MG 24 hr tablet, TAKE 1 TABLET BY MOUTH ONCE DAILY, Disp: 30 tablet, Rfl: 0 ?  polyethylene glycol powder (GLYCOLAX/MIRALAX) 17 GM/SCOOP powder, Take 17 g by mouth 2 (two) times daily as needed., Disp: 3350 g, Rfl: 1 ?  PROCTOZONE-HC 2.5 % rectal cream, APPLY RECTALLY 2 TIMES A  DAY, Disp: 60 g, Rfl: 1 ?  ULTRACARE PEN NEEDLES 32G X 4 MM MISC, USE 3 TIMES DAILY AS NEEDED, Disp: 100 each, Rfl: 11  ? ? ?Assessment & Plan:  ?COPD ? Needs PFTs. Is on anoro , Albuterol for prn use ? to have had pfts didn't have them at pulmonology to fu with them regarding such didn't start anoro ?Check pfts and revisit at next visit. ?  ?2. DM  ?Continue farxiga a1c at 5.8 last visit ?check HbA1c,  urine  microalbumin  diabetic diet plan given to pt  adviced regarding hypoglycemia and instructions given to pt today on how to prevent and treat the same if it were to occur. pt acknowledges the plan and voices understanding of the same.  exercise plan given and encouraged.   advice diabetic yearly podiatry, ophthalmology , nutritionist , dental check q 6 months, ?  ? ?3. HLD is on lipitor for such ?recheck FLP, check LFT's work on diet, SE of meds explained to pt. low fat and high fiber diet explained to pt. ?  ?  ?4. HTN ?Continue current meds.  Medication compliance emphasised. pt advised to keep Bp logs. Pt verbalised understanding of the same. Pt to have a low salt diet . Exercise to reach a goal of at least 150 mins a week.  lifestyle modifications explained and pt understands importance of the above. ?Under good control on current regimen. Continue current regimen. Continue to monitor. Call with any concerns. Refills given. Labs drawn today. ?  ?Problem List Items Addressed This Visit   ? ?  ? Endocrine  ? Type 2 diabetes mellitus with diabetic neuropathy, unspecified ([place]) - Primary  ? Relevant Orders  ? Microalbumin, Urine Waived  ? Bayer DCA Hb A1c Waived  ?  ? ?Orders Placed This Encounter  ?Procedures  ? Microalbumin, Urine Waived  ? Bayer DCA Hb A1c Waived  ?  ? ?No orders of the defined types were placed in this encounter. ?  ? ?Follow up plan: ?No follow-ups on file. ? ? ?

## 2021-10-10 ENCOUNTER — Telehealth: Payer: Self-pay

## 2021-10-10 ENCOUNTER — Telehealth: Payer: Self-pay | Admitting: Internal Medicine

## 2021-10-10 NOTE — Telephone Encounter (Signed)
PA started for Ozempic 0.25 mg 88mL pen through [employer]. Spoke with Patty at [facility]  0.25mg  has been approved from 10/11/22 through 10/05/22.  Approval # [id number].  Call ID number is [id number] ? ?

## 2021-10-10 NOTE — Telephone Encounter (Signed)
Copied from CRM [id number]. Topic: General - Other ?>> Oct 10, 2021 12:48 PM Pawlus, Maxine Glenn A wrote: ?Reason for CRM: Pt called in stating he needs a PA for Semaglutide,0.25 or 0.5MG /DOS, (OZEMPIC, 0.25 OR 0.5 MG/DOSE,) 2 MG/1.5ML SOPN, please advise. ?

## 2021-10-10 NOTE — Telephone Encounter (Signed)
PA has been approved from today though 10/05/22 ?

## 2021-10-21 ENCOUNTER — Ambulatory Visit: Payer: Medicaid Other | Admitting: Internal Medicine

## 2021-10-21 ENCOUNTER — Telehealth: Payer: Self-pay | Admitting: Cardiology

## 2021-10-21 NOTE — Telephone Encounter (Signed)
Have made several attempts to schedule follow up with no success ?Recall deleted ?

## 2021-10-21 NOTE — Telephone Encounter (Signed)
Noted  

## 2021-10-22 ENCOUNTER — Ambulatory Visit: Payer: Medicaid Other | Admitting: Internal Medicine

## 2021-11-01 ENCOUNTER — Other Ambulatory Visit: Payer: Self-pay | Admitting: Internal Medicine

## 2021-11-01 ENCOUNTER — Other Ambulatory Visit: Payer: Self-pay | Admitting: Family Medicine

## 2021-11-04 NOTE — Telephone Encounter (Signed)
Requested Prescriptions  ?Pending Prescriptions Disp Refills  ?? amLODipine (NORVASC) 10 MG tablet [Pharmacy Med Name: AMLODIPINE BESYLATE 10 MG TAB] 30 tablet 0  ?  Sig: TAKE 1 TABLET BY MOUTH ONCE DAILY  ?  ? Cardiovascular: Calcium Channel Blockers 2 Failed - 11/01/2021 12:57 PM  ?  ?  Failed - Last BP in normal range  ?  BP Readings from Last 1 Encounters:  ?10/08/21 (!) 146/82  ?   ?  ?  Passed - Last Heart Rate in normal range  ?  Pulse Readings from Last 1 Encounters:  ?10/08/21 90  ?   ?  ?  Passed - Valid encounter within last 6 months  ?  Recent Outpatient Visits   ?      ? 3 weeks ago Type 2 diabetes mellitus with diabetic neuropathy, with long-term current use of insulin (HCC)  ? [facility] Vigg, Avanti, MD  ? 3 months ago Type 2 diabetes mellitus with diabetic neuropathy, with long-term current use of insulin (HCC)  ? [facility] Vigg, Avanti, MD  ? 3 months ago Lower abdominal pain  ? [facility] Vigg, Avanti, MD  ? 1 year ago Diabetes mellitus without complication (HCC)  ? [facility] Vigg, Avanti, MD  ? 1 year ago Type 2 diabetes mellitus with diabetic neuropathy, with long-term current use of insulin (HCC)  ? [facility] Valentino Nose, NP  ?  ?  ?Future Appointments   ?        ? In 2 weeks Vigg, Avanti, MD [facility]  ?  ? ?  ?  ?  ? ? ?

## 2021-11-04 NOTE — Telephone Encounter (Signed)
Requested Prescriptions  ?Pending Prescriptions Disp Refills  ?? ACCU-CHEK AVIVA PLUS test strip [Pharmacy Med Name: ACCU-CHEK AVIVA PLUS STRIP] 100 each 1  ?  Sig: TEST TWICE DAILY  ?  ? Endocrinology: Diabetes - Testing Supplies Passed - 11/01/2021 12:56 PM  ?  ?  Passed - Valid encounter within last 12 months  ?  Recent Outpatient Visits   ?      ? 3 weeks ago Type 2 diabetes mellitus with diabetic neuropathy, with long-term current use of insulin (HCC)  ? [facility] Vigg, Avanti, MD  ? 3 months ago Type 2 diabetes mellitus with diabetic neuropathy, with long-term current use of insulin (HCC)  ? [facility] Vigg, Avanti, MD  ? 3 months ago Lower abdominal pain  ? [facility] Vigg, Avanti, MD  ? 1 year ago Diabetes mellitus without complication (HCC)  ? [facility] Vigg, Avanti, MD  ? 1 year ago Type 2 diabetes mellitus with diabetic neuropathy, with long-term current use of insulin (HCC)  ? [facility] Valentino Nose, NP  ?  ?  ?Future Appointments   ?        ? In 2 weeks Vigg, Avanti, MD [facility]  ?  ? ?  ?  ?  ? ? ?

## 2021-11-19 ENCOUNTER — Encounter: Payer: Medicaid Other | Admitting: Internal Medicine

## 2021-12-03 ENCOUNTER — Encounter: Payer: Self-pay | Admitting: Podiatry

## 2021-12-03 ENCOUNTER — Ambulatory Visit (INDEPENDENT_AMBULATORY_CARE_PROVIDER_SITE_OTHER): Payer: Medicaid Other | Admitting: Podiatry

## 2021-12-03 DIAGNOSIS — E1140 Type 2 diabetes mellitus with diabetic neuropathy, unspecified: Secondary | ICD-10-CM | POA: Diagnosis not present

## 2021-12-03 DIAGNOSIS — Z794 Long term (current) use of insulin: Secondary | ICD-10-CM | POA: Diagnosis not present

## 2021-12-03 DIAGNOSIS — M79675 Pain in left toe(s): Secondary | ICD-10-CM

## 2021-12-03 DIAGNOSIS — B351 Tinea unguium: Secondary | ICD-10-CM

## 2021-12-03 DIAGNOSIS — M79674 Pain in right toe(s): Secondary | ICD-10-CM

## 2021-12-03 NOTE — Progress Notes (Signed)
Complaint:  Visit Type: Patient returns to my office for continued preventative foot care services. Complaint: Patient states" my nails have grown long and thick and become painful to walk and wear shoes" Patient has been diagnosed with DM with no foot complications. The patient presents for preventative foot care services.  Podiatric Exam: Vascular: dorsalis pedis and posterior tibial pulses are palpable bilateral. Capillary return is immediate. Temperature gradient is WNL. Skin turgor WNL  Sensorium: Normal Semmes Weinstein monofilament test. Normal tactile sensation bilaterally. Nail Exam: Pt has thick disfigured discolored nails with subungual debris noted bilateral entire nail hallux through fifth toenails Ulcer Exam: There is no evidence of ulcer or pre-ulcerative changes or infection. Orthopedic Exam: Muscle tone and strength are WNL. No limitations in general ROM. No crepitus or effusions noted. Foot type and digits show no abnormalities. Bony prominences are unremarkable. Skin: No Porokeratosis. No infection or ulcers  Diagnosis:  Onychomycosis, , Pain in right toe, pain in left toes  Treatment & Plan Procedures and Treatment: Consent by patient was obtained for treatment procedures.   Debridement of mycotic and hypertrophic toenails, 1 through 5 bilateral and clearing of subungual debris. No ulceration, no infection noted.  Return Visit-Office Procedure: Patient instructed to return to the office for a follow up visit 3 months for continued evaluation and treatment.    Gregory Mayer DPM 

## 2022-01-30 ENCOUNTER — Other Ambulatory Visit: Payer: Self-pay

## 2022-01-30 MED ORDER — AMLODIPINE BESYLATE 10 MG PO TABS: 10.0000 mg | ORAL_TABLET | Freq: Every day | ORAL | 0 refills | Status: DC

## 2022-01-30 MED ORDER — ATORVASTATIN CALCIUM 40 MG PO TABS: 40.0000 mg | ORAL_TABLET | Freq: Every day | ORAL | 0 refills | Status: DC

## 2022-01-30 NOTE — Telephone Encounter (Signed)
Patient scheduled.

## 2022-01-30 NOTE — Telephone Encounter (Signed)
LOV 10/08/21  No future appt

## 2022-02-07 ENCOUNTER — Ambulatory Visit: Payer: Medicaid Other | Admitting: Physician Assistant

## 2022-03-03 ENCOUNTER — Ambulatory Visit: Payer: Medicaid Other | Admitting: Physician Assistant

## 2022-03-06 ENCOUNTER — Ambulatory Visit: Payer: Medicaid Other | Admitting: Podiatry

## 2022-03-19 ENCOUNTER — Other Ambulatory Visit: Payer: Self-pay | Admitting: Nurse Practitioner

## 2022-03-19 NOTE — Telephone Encounter (Signed)
Requested Prescriptions  Pending Prescriptions Disp Refills  . atorvastatin (LIPITOR) 40 MG tablet [Pharmacy Med Name: ATORVASTATIN CALCIUM 40 MG TAB] 90 tablet 0    Sig: TAKE 1 TABLET BY MOUTH AT BEDTIME     Cardiovascular:  Antilipid - Statins Failed - 03/19/2022 12:57 PM      Failed - Lipid Panel in normal range within the last 12 months    Cholesterol, Total  Date Value Ref Range Status  11/08/2019 149 100 - 199 mg/dL Final   LDL Chol Calc (NIH)  Date Value Ref Range Status  11/08/2019 71 0 - 99 mg/dL Final   HDL  Date Value Ref Range Status  11/08/2019 44 >39 mg/dL Final   Triglycerides  Date Value Ref Range Status  11/08/2019 203 (H) 0 - 149 mg/dL Final         Passed - Patient is not pregnant      Passed - Valid encounter within last 12 months    Recent Outpatient Visits          5 months ago Type 2 diabetes mellitus with diabetic neuropathy, with long-term current use of insulin ([place])   [facility] Vigg, Avanti, MD   7 months ago Type 2 diabetes mellitus with diabetic neuropathy, with long-term current use of insulin ([place])   [facility] Vigg, Avanti, MD   8 months ago Lower abdominal pain   [facility] Vigg, Avanti, MD   1 year ago Diabetes mellitus without complication ([place])   [facility] Vigg, Avanti, MD   2 years ago Type 2 diabetes mellitus with diabetic neuropathy, with long-term current use of insulin ([place])   [place] [name], NP

## 2022-03-20 ENCOUNTER — Other Ambulatory Visit: Payer: Self-pay

## 2022-03-20 MED ORDER — GABAPENTIN 600 MG PO TABS: 600.0000 mg | ORAL_TABLET | Freq: Three times a day (TID) | ORAL | 0 refills | Status: DC | PRN

## 2022-03-20 NOTE — Telephone Encounter (Signed)
Medication refill for Gabapentin last ov 10/08/21, upcoming ov no future appt . Please advise

## 2022-03-20 NOTE — Telephone Encounter (Signed)
Due for appt next month.  Please schedule with me

## 2022-03-24 NOTE — Telephone Encounter (Signed)
Tried to call patient to schedule an appointment. I was unable to leave a vm

## 2022-03-25 NOTE — Telephone Encounter (Signed)
Pt scheduled  

## 2022-04-15 ENCOUNTER — Encounter: Payer: Self-pay | Admitting: Nurse Practitioner

## 2022-04-15 ENCOUNTER — Other Ambulatory Visit: Payer: Self-pay | Admitting: Nurse Practitioner

## 2022-04-15 ENCOUNTER — Ambulatory Visit (INDEPENDENT_AMBULATORY_CARE_PROVIDER_SITE_OTHER): Payer: Medicaid Other | Admitting: Nurse Practitioner

## 2022-04-15 VITALS — BP 152/88 | HR 60 | Temp 98.3°F | Wt 234.8 lb

## 2022-04-15 DIAGNOSIS — F331 Major depressive disorder, recurrent, moderate: Secondary | ICD-10-CM | POA: Diagnosis not present

## 2022-04-15 DIAGNOSIS — Z0000 Encounter for general adult medical examination without abnormal findings: Secondary | ICD-10-CM | POA: Diagnosis not present

## 2022-04-15 DIAGNOSIS — E782 Mixed hyperlipidemia: Secondary | ICD-10-CM

## 2022-04-15 DIAGNOSIS — E1140 Type 2 diabetes mellitus with diabetic neuropathy, unspecified: Secondary | ICD-10-CM

## 2022-04-15 DIAGNOSIS — F3132 Bipolar disorder, current episode depressed, moderate: Secondary | ICD-10-CM

## 2022-04-15 DIAGNOSIS — F17200 Nicotine dependence, unspecified, uncomplicated: Secondary | ICD-10-CM | POA: Insufficient documentation

## 2022-04-15 DIAGNOSIS — Z794 Long term (current) use of insulin: Secondary | ICD-10-CM

## 2022-04-15 DIAGNOSIS — I10 Essential (primary) hypertension: Secondary | ICD-10-CM

## 2022-04-15 LAB — URINALYSIS, ROUTINE W REFLEX MICROSCOPIC
Bilirubin, UA: NEGATIVE
Glucose, UA: NEGATIVE
Ketones, UA: NEGATIVE
Leukocytes,UA: NEGATIVE
Nitrite, UA: NEGATIVE
Protein,UA: NEGATIVE
RBC, UA: NEGATIVE
Specific Gravity, UA: >1.03 — ABNORMAL HIGH (ref 1.005–1.030)
Urobilinogen, Ur: 0.2 mg/dL (ref 0.2–1.0)
pH, UA: 6 (ref 5.0–7.5)

## 2022-04-15 MED ORDER — ALBUTEROL SULFATE HFA 108 (90 BASE) MCG/ACT IN AERS: 2.0000 | INHALATION_SPRAY | Freq: Four times a day (QID) | RESPIRATORY_TRACT | 3 refills | Status: DC | PRN

## 2022-04-15 MED ORDER — GABAPENTIN 600 MG PO TABS: 600.0000 mg | ORAL_TABLET | Freq: Three times a day (TID) | ORAL | 1 refills | Status: DC | PRN

## 2022-04-15 MED ORDER — VALSARTAN 80 MG PO TABS: 80.0000 mg | ORAL_TABLET | Freq: Every day | ORAL | 0 refills | Status: DC

## 2022-04-15 NOTE — Assessment & Plan Note (Signed)
Chronic.  Controlled.  Last A1c was 5.2.  Continue with current medication regimen on Farxiga and Ozempic.  Labs ordered today.  Return to clinic in 6 months for reevaluation.  Call sooner if concerns arise.

## 2022-04-15 NOTE — Assessment & Plan Note (Signed)
Chronic. Not well controlled. Continue with Amlodipine 10mg  daily.  Will add Valsartan of 80mg .  Side effects and benefits of medication discussed during visit.  Labs ordered. Today.  Will make recommendations based on lab results.

## 2022-04-15 NOTE — Assessment & Plan Note (Signed)
Chronic. Not currently on medication.  Does not feel like he needs it.  Does not want a referral to psychiatry at this time.

## 2022-04-15 NOTE — Assessment & Plan Note (Signed)
Chronic. Smoking 2-3 ppd.  Has been smoking since he was 56 years old.  CT Lung screening ordered.  Pneumonia vaccine up to date. Declines flu.

## 2022-04-15 NOTE — Assessment & Plan Note (Signed)
Chronic. Not currently on medication.  Does not feel like he needs it.  Does not want a referral to psychiatry at this time.   

## 2022-04-15 NOTE — Assessment & Plan Note (Signed)
Recommend a healthy lifestyle through diet and exercise.  °

## 2022-04-15 NOTE — Assessment & Plan Note (Signed)
Chronic.  Controlled.  Continue with current medication regimen of Atorvastatin 40mg daily.  Labs ordered today.  Return to clinic in 6 months for reevaluation.  Call sooner if concerns arise.   

## 2022-04-15 NOTE — Progress Notes (Signed)
BP (!) 152/88   Pulse 60   Temp 98.3 F (36.8 C) (Oral)   Wt 234 lb 12.8 oz (106.5 kg)   SpO2 96%   BMI 31.84 kg/m    Subjective:    Patient ID: Lance Vaughan, male    DOB: 1965/12/20, 56 y.o.   MRN: [id number]  HPI: Lance Vaughan is a 56 y.o. male presenting on 04/15/2022 for comprehensive medical examination. Current medical complaints include:none  He currently lives with: Interim Problems from his last visit: no  HYPERTENSION / HYPERLIPIDEMIA Satisfied with current treatment? no Duration of hypertension: years BP monitoring frequency: daily BP range: 150/90 BP medication side effects: no Past BP meds: amlodipine Duration of hyperlipidemia: years Cholesterol medication side effects: no Cholesterol supplements: none Past cholesterol medications: atorvastain (lipitor) Medication compliance: excellent compliance Aspirin: yes Recent stressors: no Recurrent headaches: no Visual changes: no Palpitations: no Dyspnea: yes Chest pain: no Lower extremity edema: no Dizzy/lightheaded: no  DIABETES Hypoglycemic episodes:no Polydipsia/polyuria: no Visual disturbance: no Chest pain: no Paresthesias: yes Glucose Monitoring: yes  Accucheck frequency: Daily  Fasting glucose: 113-117  Post prandial:  Evening:  Before meals: Taking Insulin?: no  Long acting insulin:  Short acting insulin: Blood Pressure Monitoring: daily Retinal Examination: Not up to Date Foot Exam: Up to Date Diabetic Education: Not Completed Pneumovax: Up to Date Influenza: Not up to Date Aspirin: yes  Depression Screen done today and results listed below:     04/15/2022    1:11 PM 09/10/2020    1:35 PM 07/26/2018    1:20 PM 04/15/2018    2:26 PM 03/18/2018    3:00 PM  Depression screen PHQ 2/9  Decreased Interest 1 0 3 1 3   Down, Depressed, Hopeless 1 2 3 1 3   PHQ - 2 Score 2 2 6 2 6   Altered sleeping 3 2 3 2 3   Tired, decreased energy 3 2 3 3 3   Change in appetite 2 2 2 3 2   Feeling bad or  failure about yourself  0 2 3 1 3   Trouble concentrating 0 2 0 2 2  Moving slowly or fidgety/restless 2 2 3 3 1   Suicidal thoughts 1 1 0 0 2  PHQ-9 Score 13 15 20 16 22   Difficult doing work/chores Somewhat difficult Somewhat difficult Extremely dIfficult      The patient did a history of falls. I did complete a risk assessment for falls. A plan of care for falls was documented.   Past Medical History:  Past Medical History:  Diagnosis Date   Arthritis    Bipolar disorder ([place])    Bursitis    Depression    Diabetes mellitus without complication ([place])    Diverticulitis    Hyperlipidemia    Hypertension    Psychosis ([place])    Psychosis ([place])     Surgical History:  Past Surgical History:  Procedure Laterality Date   COLONOSCOPY WITH PROPOFOL N/A 05/03/2018   Procedure: COLONOSCOPY WITH PROPOFOL;  Surgeon: Lin Landsman, MD;  Location: [facility];  Service: Gastroenterology;  Laterality: N/A;   HEMORRHOID SURGERY N/A 07/06/2019   Procedure: HEMORRHOIDECTOMY;  Surgeon: Ronny Bacon, MD;  Location: [facility] ORS;  Service: General;  Laterality: N/A;   RECTAL EXAM UNDER ANESTHESIA N/A 07/06/2019   Procedure: RECTAL EXAM UNDER ANESTHESIA;  Surgeon: Ronny Bacon, MD;  Location: [facility] ORS;  Service: General;  Laterality: N/A;    Medications:  Current Outpatient Medications on File Prior to Visit  Medication Sig   amLODipine (  NORVASC) 10 MG tablet Take 1 tablet (10 mg total) by mouth daily.   aspirin EC 81 MG tablet Take 1 tablet (81 mg total) by mouth daily.   atorvastatin (LIPITOR) 40 MG tablet TAKE 1 TABLET BY MOUTH AT BEDTIME   dapagliflozin propanediol (FARXIGA) 5 MG TABS tablet Take 1 tablet (5 mg total) by mouth daily. needs appointment for further refills   ibuprofen (ADVIL) 800 MG tablet Take 1 tablet (800 mg total) by mouth every 8 (eight) hours as needed.   metoprolol succinate (TOPROL-XL) 25 MG 24 hr tablet TAKE 1 TABLET BY MOUTH ONCE DAILY   polyethylene  glycol powder (GLYCOLAX/MIRALAX) 17 GM/SCOOP powder Take 17 g by mouth 2 (two) times daily as needed.   Semaglutide,0.25 or 0.5MG /DOS, (OZEMPIC, 0.25 OR 0.5 MG/DOSE,) 2 MG/1.5ML SOPN Inject 0.5 mg into the skin once a week.   ACCU-CHEK AVIVA PLUS test strip TEST TWICE DAILY   ULTRACARE PEN NEEDLES 32G X 4 MM MISC USE 3 TIMES DAILY AS NEEDED   No current facility-administered medications on file prior to visit.    Allergies:  Allergies  Allergen Reactions   Lisinopril Other (See Comments)    Chest pain    Social History:  Social History   Socioeconomic History   Marital status: Single    Spouse name: Not on file   Number of children: 4   Years of education: Not on file   Highest education level: GED or equivalent  Occupational History   Occupation: disability  Tobacco Use   Smoking status: Every Day    Packs/day: 2.00    Years: 30.00    Total pack years: 60.00    Types: Cigarettes   Smokeless tobacco: Never  Vaping Use   Vaping Use: Never used  Substance and Sexual Activity   Alcohol use: No   Drug use: No   Sexual activity: Yes  Other Topics Concern   Not on file  Social History Narrative   Patient is currently living with his ex-wife, but is looking for other housing options.  He gets food when he has a ride to the foodbank.   Social Determinants of Health   Financial Resource Strain: High Risk (11/19/2017)   Overall Financial Resource Strain (CARDIA)    Difficulty of Paying Living Expenses: Very hard  Food Insecurity: Food Insecurity Present (11/19/2017)   Hunger Vital Sign    Worried About Running Out of Food in the Last Year: Often true    Ran Out of Food in the Last Year: Sometimes true  Transportation Needs: Unmet Transportation Needs (11/19/2017)   PRAPARE - Administrator, Civil ServiceTransportation    Lack of Transportation (Medical): Yes    Lack of Transportation (Non-Medical): No  Physical Activity: Insufficiently Active (11/19/2017)   Exercise Vital Sign    Days of Exercise per  Week: 3 days    Minutes of Exercise per Session: 10 min  Stress: Stress Concern Present (11/19/2017)   [employer] - Occupational Stress Questionnaire    Feeling of Stress : Rather much  Social Connections: Socially Isolated (11/19/2017)   Social Connection and Isolation Panel [NHANES]    Frequency of Communication with Friends and Family: Once a week    Frequency of Social Gatherings with Friends and Family: Never    Attends Religious Services: Never    Database administratorActive Member of Clubs or Organizations: No    Attends BankerClub or Organization Meetings: Never    Marital Status: Separated  Intimate Partner Violence: Not At Risk (11/19/2017)  Humiliation, Afraid, Rape, and Kick questionnaire    Fear of Current or Ex-Partner: No    Emotionally Abused: No    Physically Abused: No    Sexually Abused: No   Social History   Tobacco Use  Smoking Status Every Day   Packs/day: 2.00   Years: 30.00   Total pack years: 60.00   Types: Cigarettes  Smokeless Tobacco Never   Social History   Substance and Sexual Activity  Alcohol Use No    Family History:  Family History  Problem Relation Age of Onset   Hypertension Mother    Hypertension Sister    Hyperlipidemia Sister    Hyperlipidemia Sister    Hypertension Sister    Diabetes Maternal Grandmother     Past medical history, surgical history, medications, allergies, family history and social history reviewed with patient today and changes made to appropriate areas of the chart.   Review of Systems  Eyes:  Negative for blurred vision and double vision.  Respiratory:  Positive for shortness of breath.   Cardiovascular:  Negative for chest pain, palpitations and leg swelling.  Neurological:  Negative for dizziness and headaches.   All other ROS negative except what is listed above and in the HPI.      Objective:    BP (!) 152/88   Pulse 60   Temp 98.3 F (36.8 C) (Oral)   Wt 234 lb 12.8 oz (106.5 kg)   SpO2  96%   BMI 31.84 kg/m   Wt Readings from Last 3 Encounters:  04/15/22 234 lb 12.8 oz (106.5 kg)  10/08/21 235 lb 9.6 oz (106.9 kg)  08/30/21 242 lb 12.8 oz (110.1 kg)    Physical Exam Vitals and nursing note reviewed.  Constitutional:      General: He is not in acute distress.    Appearance: Normal appearance. He is obese. He is not ill-appearing, toxic-appearing or diaphoretic.  HENT:     Head: Normocephalic.     Right Ear: Tympanic membrane, ear canal and external ear normal.     Left Ear: Tympanic membrane, ear canal and external ear normal.     Nose: Nose normal. No congestion or rhinorrhea.     Mouth/Throat:     Mouth: Mucous membranes are moist.  Eyes:     General:        Right eye: No discharge.        Left eye: No discharge.     Extraocular Movements: Extraocular movements intact.     Conjunctiva/sclera: Conjunctivae normal.     Pupils: Pupils are equal, round, and reactive to light.  Cardiovascular:     Rate and Rhythm: Normal rate and regular rhythm.     Heart sounds: No murmur heard. Pulmonary:     Effort: Pulmonary effort is normal. No respiratory distress.     Breath sounds: Normal breath sounds. No wheezing, rhonchi or rales.  Abdominal:     General: Abdomen is flat. Bowel sounds are normal. There is no distension.     Palpations: Abdomen is soft.     Tenderness: There is no abdominal tenderness. There is no guarding.  Musculoskeletal:     Cervical back: Normal range of motion and neck supple.  Skin:    General: Skin is warm and dry.     Capillary Refill: Capillary refill takes less than 2 seconds.  Neurological:     General: No focal deficit present.     Mental Status: He is alert and oriented to person,  place, and time.     Cranial Nerves: No cranial nerve deficit.     Motor: No weakness.     Deep Tendon Reflexes: Reflexes normal.  Psychiatric:        Mood and Affect: Mood normal.        Behavior: Behavior normal.        Thought Content: Thought  content normal.        Judgment: Judgment normal.     Results for orders placed or performed in visit on 10/08/21  Bayer DCA Hb A1c Waived  Result Value Ref Range   HB A1C (BAYER DCA - WAIVED) 5.2 4.8 - 5.6 %  Microalbumin, Urine Waived  Result Value Ref Range   Microalb, Ur Waived 80 (H) 0 - 19 mg/L   Creatinine, Urine Waived 300 10 - 300 mg/dL   Microalb/Creat Ratio 30-300 (H) <30 mg/g      Assessment & Plan:   Problem List Items Addressed This Visit       Cardiovascular and Mediastinum   Hypertension    Chronic. Not well controlled. Continue with Amlodipine 10mg  daily.  Will add Valsartan of 80mg .  Side effects and benefits of medication discussed during visit.  Labs ordered. Today.  Will make recommendations based on lab results.       Relevant Medications   valsartan (DIOVAN) 80 MG tablet     Endocrine   Type 2 diabetes mellitus with diabetic neuropathy, unspecified (HCC)    Chronic.  Controlled.  Last A1c was 5.2.  Continue with current medication regimen on Farxiga and Ozempic.  Labs ordered today.  Return to clinic in 6 months for reevaluation.  Call sooner if concerns arise.        Relevant Medications   valsartan (DIOVAN) 80 MG tablet   Other Relevant Orders   HgB A1c     Other   Hyperlipidemia    Chronic.  Controlled.  Continue with current medication regimen of Atorvastatin 40mg  daily.  Labs ordered today.  Return to clinic in 6 months for reevaluation.  Call sooner if concerns arise.        Relevant Medications   valsartan (DIOVAN) 80 MG tablet   Other Relevant Orders   Lipid panel   Major depression, recurrent (HCC)    Chronic. Not currently on medication.  Does not feel like he needs it.  Does not want a referral to psychiatry at this time.        Bipolar disorder (HCC)    Chronic. Not currently on medication.  Does not feel like he needs it.  Does not want a referral to psychiatry at this time.        Morbid obesity (HCC)    Recommend a  healthy lifestyle through diet and exercise.       Current every day smoker    Chronic. Smoking 2-3 ppd.  Has been smoking since he was 56 years old.  CT Lung screening ordered.  Pneumonia vaccine up to date. Declines flu.      Relevant Orders   CT CHEST LUNG CA SCREEN LOW DOSE W/O CM   Other Visit Diagnoses     Annual physical exam    -  Primary   Health maintenance reivewed during visit today.  Labs ordered. Declined flu shot. Up to date on Colonoscopy.    Relevant Orders   TSH   PSA   Lipid panel   CBC with Differential/Platelet   Comprehensive metabolic panel   Urinalysis, Routine  w reflex microscopic   HgB A1c        Discussed aspirin prophylaxis for myocardial infarction prevention and decision was made to continue ASA  LABORATORY TESTING:  Health maintenance labs ordered today as discussed above.   The natural history of prostate cancer and ongoing controversy regarding screening and potential treatment outcomes of prostate cancer has been discussed with the patient. The meaning of a false positive PSA and a false negative PSA has been discussed. He indicates understanding of the limitations of this screening test and wishes to proceed with screening PSA testing.   IMMUNIZATIONS:   - Tdap: Tetanus vaccination status reviewed: last tetanus booster within 10 years. - Influenza: Refused - Pneumovax: Up to date - Prevnar: Not applicable - COVID: Not applicable - HPV: Not applicable - Shingrix vaccine:  Discussed at visit today. Would like to hold off  SCREENING: - Colonoscopy: Up to date  Discussed with patient purpose of the colonoscopy is to detect colon cancer at curable precancerous or early stages   - AAA Screening: Not applicable  -Hearing Test: Not applicable  -Spirometry: Not applicable   PATIENT COUNSELING:    Sexuality: Discussed sexually transmitted diseases, partner selection, use of condoms, avoidance of unintended pregnancy  and contraceptive  alternatives.   Advised to avoid cigarette smoking.  I discussed with the patient that most people either abstain from alcohol or drink within safe limits (<=14/week and <=4 drinks/occasion for males, <=7/weeks and <= 3 drinks/occasion for females) and that the risk for alcohol disorders and other health effects rises proportionally with the number of drinks per week and how often a drinker exceeds daily limits.  Discussed cessation/primary prevention of drug use and availability of treatment for abuse.   Diet: Encouraged to adjust caloric intake to maintain  or achieve ideal body weight, to reduce intake of dietary saturated fat and total fat, to limit sodium intake by avoiding high sodium foods and not adding table salt, and to maintain adequate dietary potassium and calcium preferably from fresh fruits, vegetables, and low-fat dairy products.    stressed the importance of regular exercise  Injury prevention: Discussed safety belts, safety helmets, smoke detector, smoking near bedding or upholstery.   Dental health: Discussed importance of regular tooth brushing, flossing, and dental visits.   Follow up plan: NEXT PREVENTATIVE PHYSICAL DUE IN 1 YEAR. Return in about 1 month (around 05/16/2022) for BP Check.

## 2022-04-16 LAB — LIPID PANEL
Chol/HDL Ratio: 3.8 ratio (ref 0.0–5.0)
Cholesterol, Total: 157 mg/dL (ref 100–199)
HDL: 41 mg/dL (ref >39)
LDL Chol Calc (NIH): 86 mg/dL (ref 0–99)
Triglycerides: 173 mg/dL — ABNORMAL HIGH (ref 0–149)
VLDL Cholesterol Cal: 30 mg/dL (ref 5–40)

## 2022-04-16 LAB — CBC WITH DIFFERENTIAL/PLATELET
Basophils Absolute: 0 10*3/uL (ref 0.0–0.2)
Basos: 1 %
EOS (ABSOLUTE): 0.2 10*3/uL (ref 0.0–0.4)
Eos: 6 %
Hematocrit: 43.9 % (ref 37.5–51.0)
Hemoglobin: 15.1 g/dL (ref 13.0–17.7)
Immature Grans (Abs): 0 10*3/uL (ref 0.0–0.1)
Immature Granulocytes: 0 %
Lymphocytes Absolute: 1.1 10*3/uL (ref 0.7–3.1)
Lymphs: 27 %
MCH: 33.6 pg — ABNORMAL HIGH (ref 26.6–33.0)
MCHC: 34.4 g/dL (ref 31.5–35.7)
MCV: 98 fL — ABNORMAL HIGH (ref 79–97)
Monocytes Absolute: 0.3 10*3/uL (ref 0.1–0.9)
Monocytes: 7 %
Neutrophils Absolute: 2.6 10*3/uL (ref 1.4–7.0)
Neutrophils: 59 %
Platelets: 332 10*3/uL (ref 150–450)
RBC: 4.49 x10E6/uL (ref 4.14–5.80)
RDW: 12.6 % (ref 11.6–15.4)
WBC: 4.2 10*3/uL (ref 3.4–10.8)

## 2022-04-16 LAB — COMPREHENSIVE METABOLIC PANEL
ALT: 43 IU/L (ref 0–44)
AST: 29 IU/L (ref 0–40)
Albumin/Globulin Ratio: 1.8 (ref 1.2–2.2)
Albumin: 4.2 g/dL (ref 3.8–4.9)
Alkaline Phosphatase: 72 IU/L (ref 44–121)
BUN/Creatinine Ratio: 15 (ref 9–20)
BUN: 17 mg/dL (ref 6–24)
Bilirubin Total: 0.3 mg/dL (ref 0.0–1.2)
CO2: 21 mmol/L (ref 20–29)
Calcium: 8.7 mg/dL (ref 8.7–10.2)
Chloride: 106 mmol/L (ref 96–106)
Creatinine, Ser: 1.11 mg/dL (ref 0.76–1.27)
Globulin, Total: 2.4 g/dL (ref 1.5–4.5)
Glucose: 186 mg/dL — ABNORMAL HIGH (ref 70–99)
Potassium: 4.2 mmol/L (ref 3.5–5.2)
Sodium: 141 mmol/L (ref 134–144)
Total Protein: 6.6 g/dL (ref 6.0–8.5)
eGFR: 78 mL/min/{1.73_m2} (ref >59)

## 2022-04-16 LAB — TSH: TSH: 0.601 u[IU]/mL (ref 0.450–4.500)

## 2022-04-16 LAB — PSA: Prostate Specific Ag, Serum: 0.4 ng/mL (ref 0.0–4.0)

## 2022-04-16 LAB — HEMOGLOBIN A1C
Est. average glucose Bld gHb Est-mCnc: 131 mg/dL
Hgb A1c MFr Bld: 6.2 % — ABNORMAL HIGH (ref 4.8–5.6)

## 2022-04-16 NOTE — Telephone Encounter (Signed)
Requested Prescriptions  Pending Prescriptions Disp Refills  . amLODipine (NORVASC) 10 MG tablet [Pharmacy Med Name: AMLODIPINE BESYLATE 10 MG TAB] 90 tablet 0    Sig: TAKE 1 TABLET BY MOUTH ONCE DAILY     Cardiovascular: Calcium Channel Blockers 2 Failed - 04/15/2022  3:38 PM      Failed - Last BP in normal range    BP Readings from Last 1 Encounters:  04/15/22 (!) 152/88         Passed - Last Heart Rate in normal range    Pulse Readings from Last 1 Encounters:  04/15/22 60         Passed - Valid encounter within last 6 months    Recent Outpatient Visits          Yesterday Annual physical exam   [facility] [name], NP   6 months ago Type 2 diabetes mellitus with diabetic neuropathy, with long-term current use of insulin ([place])   [facility] Vigg, Avanti, MD   8 months ago Type 2 diabetes mellitus with diabetic neuropathy, with long-term current use of insulin ([place])   [facility] Vigg, Avanti, MD   9 months ago Lower abdominal pain   [facility] Vigg, Avanti, MD   1 year ago Diabetes mellitus without complication ([place])   [facility] Vigg, Avanti, MD      Future Appointments            In 1 month [name], NP [place], [place]

## 2022-04-16 NOTE — Progress Notes (Signed)
Please let patient know that his lab work looks good.  No concerns at this time.  Continue with current medication regimen.  His A1c is well controlled at 6.2.  His Triglycerides have improved some.  I will see him at out next visit.

## 2022-05-13 ENCOUNTER — Other Ambulatory Visit: Payer: Self-pay | Admitting: Nurse Practitioner

## 2022-05-13 ENCOUNTER — Encounter: Payer: Self-pay | Admitting: Podiatry

## 2022-05-13 ENCOUNTER — Ambulatory Visit (INDEPENDENT_AMBULATORY_CARE_PROVIDER_SITE_OTHER): Payer: Medicaid Other | Admitting: Podiatry

## 2022-05-13 DIAGNOSIS — M79675 Pain in left toe(s): Secondary | ICD-10-CM

## 2022-05-13 DIAGNOSIS — B351 Tinea unguium: Secondary | ICD-10-CM | POA: Diagnosis not present

## 2022-05-13 DIAGNOSIS — M79674 Pain in right toe(s): Secondary | ICD-10-CM

## 2022-05-13 DIAGNOSIS — Z794 Long term (current) use of insulin: Secondary | ICD-10-CM

## 2022-05-13 DIAGNOSIS — E1140 Type 2 diabetes mellitus with diabetic neuropathy, unspecified: Secondary | ICD-10-CM

## 2022-05-13 NOTE — Progress Notes (Signed)
Complaint:  Visit Type: Patient returns to my office for continued preventative foot care services. Complaint: Patient states" my nails have grown long and thick and become painful to walk and wear shoes" Patient has been diagnosed with DM with no foot complications. The patient presents for preventative foot care services.  Podiatric Exam: Vascular: dorsalis pedis and posterior tibial pulses are palpable bilateral. Capillary return is immediate. Temperature gradient is WNL. Skin turgor WNL  Sensorium: Normal Semmes Weinstein monofilament test. Normal tactile sensation bilaterally. Nail Exam: Pt has thick disfigured discolored nails with subungual debris noted bilateral entire nail hallux through fifth toenails Ulcer Exam: There is no evidence of ulcer or pre-ulcerative changes or infection. Orthopedic Exam: Muscle tone and strength are WNL. No limitations in general ROM. No crepitus or effusions noted. Foot type and digits show no abnormalities. Bony prominences are unremarkable. Skin: No Porokeratosis. No infection or ulcers  Diagnosis:  Onychomycosis, , Pain in right toe, pain in left toes  Treatment & Plan Procedures and Treatment: Consent by patient was obtained for treatment procedures.   Debridement of mycotic and hypertrophic toenails, 1 through 5 bilateral and clearing of subungual debris. No ulceration, no infection noted.  Return Visit-Office Procedure: Patient instructed to return to the office for a follow up visit 3 months for continued evaluation and treatment.    Nicholes Rough D.P.M.

## 2022-05-13 NOTE — Telephone Encounter (Signed)
Requested Prescriptions  Pending Prescriptions Disp Refills   valsartan (DIOVAN) 80 MG tablet [Pharmacy Med Name: VALSARTAN 80 MG TAB] 90 tablet 0    Sig: TAKE 1 TABLET BY MOUTH ONCE DAILY     Cardiovascular:  Angiotensin Receptor Blockers Failed - 05/13/2022  3:43 PM      Failed - Last BP in normal range    BP Readings from Last 1 Encounters:  04/15/22 (!) 152/88         Passed - Cr in normal range and within 180 days    Creatinine, Ser  Date Value Ref Range Status  04/15/2022 1.11 0.76 - 1.27 mg/dL Final         Passed - K in normal range and within 180 days    Potassium  Date Value Ref Range Status  04/15/2022 4.2 3.5 - 5.2 mmol/L Final         Passed - Patient is not pregnant      Passed - Valid encounter within last 6 months    Recent Outpatient Visits           4 weeks ago Annual physical exam   [facility] Larae Grooms, NP   7 months ago Type 2 diabetes mellitus with diabetic neuropathy, with long-term current use of insulin (HCC)   [facility] Vigg, Avanti, MD   9 months ago Type 2 diabetes mellitus with diabetic neuropathy, with long-term current use of insulin (HCC)   [facility] Vigg, Avanti, MD   10 months ago Lower abdominal pain   [facility] Vigg, Avanti, MD   1 year ago Diabetes mellitus without complication (HCC)   [facility] Vigg, Avanti, MD       Future Appointments             In 1 week Larae Grooms, NP [facility]

## 2022-05-19 NOTE — Progress Notes (Deleted)
There were no vitals taken for this visit.   Subjective:    Patient ID: Lance Vaughan, male    DOB: 22-May-1966, 56 y.o.   MRN: [id number]  HPI: Lance Vaughan is a 56 y.o. male  No chief complaint on file.  HYPERTENSION {Blank single:19197::"without","with"} Chronic Kidney Disease Hypertension status: {Blank single:19197::"controlled","uncontrolled","better","worse","exacerbated","stable"}  Satisfied with current treatment? {Blank single:19197::"yes","no"} Duration of hypertension: {Blank single:19197::"chronic","months","years"} BP monitoring frequency:  {Blank single:19197::"not checking","rarely","daily","weekly","monthly","a few times a day","a few times a week","a few times a month"} BP range:  BP medication side effects:  {Blank single:19197::"yes","no"} Medication compliance: {Blank single:19197::"excellent compliance","good compliance","fair compliance","poor compliance"} Previous BP meds:{Blank TFTDDUKG:25427::"CWCB","JSEGBTDVVO","HYWVPXTGGY/IRSWNIOEVO","JJKKXFGH","WEXHBZJIRC","VELFYBOFBP/ZWCH","ENIDPOEUMP (bystolic)","carvedilol","chlorthalidone","clonidine","diltiazem","exforge HCT","HCTZ","irbesartan (avapro)","labetalol","lisinopril","lisinopril-HCTZ","losartan (cozaar)","methyldopa","nifedipine","olmesartan (benicar)","olmesartan-HCTZ","quinapril","ramipril","spironalactone","tekturna","valsartan","valsartan-HCTZ","verapamil"} Aspirin: {Blank single:19197::"yes","no"} Recurrent headaches: {Blank single:19197::"yes","no"} Visual changes: {Blank single:19197::"yes","no"} Palpitations: {Blank single:19197::"yes","no"} Dyspnea: {Blank single:19197::"yes","no"} Chest pain: {Blank single:19197::"yes","no"} Lower extremity edema: {Blank single:19197::"yes","no"} Dizzy/lightheaded: {Blank single:19197::"yes","no"}  Relevant past medical, surgical, family and social history reviewed and updated as indicated. Interim medical history since our last visit reviewed. Allergies and  medications reviewed and updated.  Review of Systems  Per HPI unless specifically indicated above     Objective:    There were no vitals taken for this visit.  Wt Readings from Last 3 Encounters:  04/15/22 234 lb 12.8 oz (106.5 kg)  10/08/21 235 lb 9.6 oz (106.9 kg)  08/30/21 242 lb 12.8 oz (110.1 kg)    Physical Exam  Results for orders placed or performed in visit on 04/15/22  TSH  Result Value Ref Range   TSH 0.601 0.450 - 4.500 uIU/mL  PSA  Result Value Ref Range   Prostate Specific Ag, Serum 0.4 0.0 - 4.0 ng/mL  Lipid panel  Result Value Ref Range   Cholesterol, Total 157 100 - 199 mg/dL   Triglycerides 173 (H) 0 - 149 mg/dL   HDL 41 >39 mg/dL   VLDL Cholesterol Cal 30 5 - 40 mg/dL   LDL Chol Calc (NIH) 86 0 - 99 mg/dL   Chol/HDL Ratio 3.8 0.0 - 5.0 ratio  CBC with Differential/Platelet  Result Value Ref Range   WBC 4.2 3.4 - 10.8 x10E3/uL   RBC 4.49 4.14 - 5.80 x10E6/uL   Hemoglobin 15.1 13.0 - 17.7 g/dL   Hematocrit 43.9 37.5 - 51.0 %   MCV 98 (H) 79 - 97 fL   MCH 33.6 (H) 26.6 - 33.0 pg   MCHC 34.4 31.5 - 35.7 g/dL   RDW 12.6 11.6 - 15.4 %   Platelets 332 150 - 450 x10E3/uL   Neutrophils 59 Not Estab. %   Lymphs 27 Not Estab. %   Monocytes 7 Not Estab. %   Eos 6 Not Estab. %   Basos 1 Not Estab. %   Neutrophils Absolute 2.6 1.4 - 7.0 x10E3/uL   Lymphocytes Absolute 1.1 0.7 - 3.1 x10E3/uL   Monocytes Absolute 0.3 0.1 - 0.9 x10E3/uL   EOS (ABSOLUTE) 0.2 0.0 - 0.4 x10E3/uL   Basophils Absolute 0.0 0.0 - 0.2 x10E3/uL   Immature Granulocytes 0 Not Estab. %   Immature Grans (Abs) 0.0 0.0 - 0.1 x10E3/uL  Comprehensive metabolic panel  Result Value Ref Range   Glucose 186 (H) 70 - 99 mg/dL   BUN 17 6 - 24 mg/dL   Creatinine, Ser 1.11 0.76 - 1.27 mg/dL   eGFR 78 >59 mL/min/1.73   BUN/Creatinine Ratio 15 9 - 20   Sodium 141 134 - 144 mmol/L   Potassium 4.2 3.5 - 5.2 mmol/L   Chloride 106 96 - 106 mmol/L   CO2 21  20 - 29 mmol/L   Calcium 8.7 8.7 - 10.2  mg/dL   Total Protein 6.6 6.0 - 8.5 g/dL   Albumin 4.2 3.8 - 4.9 g/dL   Globulin, Total 2.4 1.5 - 4.5 g/dL   Albumin/Globulin Ratio 1.8 1.2 - 2.2   Bilirubin Total 0.3 0.0 - 1.2 mg/dL   Alkaline Phosphatase 72 44 - 121 IU/L   AST 29 0 - 40 IU/L   ALT 43 0 - 44 IU/L  Urinalysis, Routine w reflex microscopic  Result Value Ref Range   Specific Gravity, UA >1.030 (H) 1.005 - 1.030   pH, UA 6.0 5.0 - 7.5   Color, UA Yellow Yellow   Appearance Ur Clear Clear   Leukocytes,UA Negative Negative   Protein,UA Negative Negative/Trace   Glucose, UA Negative Negative   Ketones, UA Negative Negative   RBC, UA Negative Negative   Bilirubin, UA Negative Negative   Urobilinogen, Ur 0.2 0.2 - 1.0 mg/dL   Nitrite, UA Negative Negative   Microscopic Examination Comment   HgB A1c  Result Value Ref Range   Hgb A1c MFr Bld 6.2 (H) 4.8 - 5.6 %   Est. average glucose Bld gHb Est-mCnc 131 mg/dL      Assessment & Plan:   Problem List Items Addressed This Visit       Cardiovascular and Mediastinum   Hypertension - Primary     Follow up plan: No follow-ups on file.

## 2022-05-20 ENCOUNTER — Other Ambulatory Visit: Payer: Self-pay

## 2022-05-20 ENCOUNTER — Ambulatory Visit: Payer: Medicaid Other | Admitting: Nurse Practitioner

## 2022-05-20 DIAGNOSIS — I10 Essential (primary) hypertension: Secondary | ICD-10-CM

## 2022-05-20 MED ORDER — ONETOUCH ULTRA 2 W/DEVICE KIT: 1.0000 | PACK | Freq: Every day | 0 refills | Status: DC

## 2022-05-20 MED ORDER — ACCU-CHEK AVIVA PLUS VI STRP: ORAL_STRIP | 2 refills | Status: DC

## 2022-05-20 MED ORDER — ONETOUCH ULTRA VI STRP: 1.0000 | ORAL_STRIP | Freq: Every day | 3 refills | Status: DC

## 2022-05-20 MED ORDER — ONETOUCH ULTRASOFT LANCETS MISC: 1.0000 | Freq: Every day | 3 refills | Status: DC

## 2022-05-21 ENCOUNTER — Other Ambulatory Visit: Payer: Self-pay

## 2022-05-21 DIAGNOSIS — E1140 Type 2 diabetes mellitus with diabetic neuropathy, unspecified: Secondary | ICD-10-CM

## 2022-05-21 MED ORDER — ACCU-CHEK AVIVA PLUS W/DEVICE KIT: 1.0000 | PACK | Freq: Every day | 0 refills | Status: DC

## 2022-05-21 MED ORDER — ACCU-CHEK SOFTCLIX LANCETS MISC: 1.0000 | Freq: Every day | 3 refills | Status: DC

## 2022-05-21 MED ORDER — ACCU-CHEK AVIVA PLUS VI STRP: 1.0000 | ORAL_STRIP | Freq: Every day | 3 refills | Status: DC

## 2022-05-27 ENCOUNTER — Other Ambulatory Visit: Payer: Self-pay

## 2022-05-28 MED ORDER — OZEMPIC (0.25 OR 0.5 MG/DOSE) 2 MG/1.5ML ~~LOC~~ SOPN: 0.5000 mg | PEN_INJECTOR | SUBCUTANEOUS | 0 refills | Status: DC

## 2022-06-19 ENCOUNTER — Ambulatory Visit: Payer: Medicaid Other | Admitting: Nurse Practitioner

## 2022-06-24 ENCOUNTER — Other Ambulatory Visit: Payer: Self-pay | Admitting: Nurse Practitioner

## 2022-06-24 NOTE — Telephone Encounter (Signed)
Pls be advise pt called re a refill for   Disp Refills Start End   Semaglutide,0.25 or 0.5MG /DOS, (OZEMPIC, 0.25 OR 0.5 MG/DOSE,) 2 MG/1.5ML SOPN       He states that he had had an appt for 12/28 and provider cancelled. The soonest appt available at the time was on 1/31 which he took. Pt wanted to remind dr of provider cancellation so script from pharmacy will be honored. FU if necessary at [phone]

## 2022-06-25 ENCOUNTER — Other Ambulatory Visit: Payer: Self-pay

## 2022-06-25 MED ORDER — OZEMPIC (0.25 OR 0.5 MG/DOSE) 2 MG/1.5ML ~~LOC~~ SOPN: 0.5000 mg | PEN_INJECTOR | SUBCUTANEOUS | 0 refills | Status: DC

## 2022-06-25 MED ORDER — DAPAGLIFLOZIN PROPANEDIOL 5 MG PO TABS: 5.0000 mg | ORAL_TABLET | Freq: Every day | ORAL | 0 refills | Status: DC

## 2022-06-25 NOTE — Telephone Encounter (Signed)
Routing to provider  

## 2022-07-23 ENCOUNTER — Encounter: Payer: Self-pay | Admitting: Nurse Practitioner

## 2022-07-23 ENCOUNTER — Ambulatory Visit (INDEPENDENT_AMBULATORY_CARE_PROVIDER_SITE_OTHER): Payer: Medicaid Other | Admitting: Nurse Practitioner

## 2022-07-23 VITALS — BP 164/100 | HR 91 | Temp 98.8°F | Wt 232.6 lb

## 2022-07-23 DIAGNOSIS — I10 Essential (primary) hypertension: Secondary | ICD-10-CM

## 2022-07-23 DIAGNOSIS — E1140 Type 2 diabetes mellitus with diabetic neuropathy, unspecified: Secondary | ICD-10-CM

## 2022-07-23 DIAGNOSIS — Z794 Long term (current) use of insulin: Secondary | ICD-10-CM | POA: Diagnosis not present

## 2022-07-23 MED ORDER — DAPAGLIFLOZIN PROPANEDIOL 10 MG PO TABS: 10.0000 mg | ORAL_TABLET | Freq: Every day | ORAL | 1 refills | Status: DC

## 2022-07-23 MED ORDER — VALSARTAN 80 MG PO TABS: 80.0000 mg | ORAL_TABLET | Freq: Every day | ORAL | 0 refills | Status: DC

## 2022-07-23 NOTE — Assessment & Plan Note (Signed)
Chronic. Not well controlled.  Did not start Valsartan after last visit.  Did not take Amlodipine today.  Discussed with patient the importance of taking medications.  Requested patient bring medications to next visit.  Follow up in 1 month.  Call sooner if concerns arise.

## 2022-07-23 NOTE — Assessment & Plan Note (Signed)
Chronic. Increase Farxiga to 10mg  to maximize benefits of medication.

## 2022-07-23 NOTE — Progress Notes (Signed)
BP (!) 164/100 (BP Location: Left Arm, Cuff Size: Normal)   Pulse 91   Temp 98.8 F (37.1 C) (Oral)   Wt 232 lb 9.6 oz (105.5 kg)   SpO2 97%   BMI 31.54 kg/m    Subjective:    Patient ID: Lance Vaughan, male    DOB: [birth year], 57 y.o.   MRN: [id number]  HPI: Lance Vaughan is a 57 y.o. male  Chief Complaint  Patient presents with   Hypertension   HYPERTENSION without Chronic Kidney Disease Hypertension status: controlled  Satisfied with current treatment? yes Duration of hypertension: years BP monitoring frequency:  not checking BP range:  BP medication side effects:  no Medication compliance: excellent compliance Previous BP meds:amlodipine Aspirin: yes Recurrent headaches: no Visual changes: no Palpitations: no Dyspnea: no Chest pain: no Lower extremity edema: no Dizzy/lightheaded: no  Relevant past medical, surgical, family and social history reviewed and updated as indicated. Interim medical history since our last visit reviewed. Allergies and medications reviewed and updated.  Review of Systems  Eyes:  Negative for visual disturbance.  Respiratory:  Negative for shortness of breath.   Cardiovascular:  Negative for chest pain and leg swelling.  Neurological:  Negative for light-headedness and headaches.    Per HPI unless specifically indicated above     Objective:    BP (!) 164/100 (BP Location: Left Arm, Cuff Size: Normal)   Pulse 91   Temp 98.8 F (37.1 C) (Oral)   Wt 232 lb 9.6 oz (105.5 kg)   SpO2 97%   BMI 31.54 kg/m   Wt Readings from Last 3 Encounters:  07/23/22 232 lb 9.6 oz (105.5 kg)  04/15/22 234 lb 12.8 oz (106.5 kg)  10/08/21 235 lb 9.6 oz (106.9 kg)    Physical Exam Vitals and nursing note reviewed.  Constitutional:      General: He is not in acute distress.    Appearance: Normal appearance. He is not ill-appearing, toxic-appearing or diaphoretic.  HENT:     Head: Normocephalic.     Right Ear: External ear normal.     Left Ear:  External ear normal.     Nose: Nose normal. No congestion or rhinorrhea.     Mouth/Throat:     Mouth: Mucous membranes are moist.  Eyes:     General:        Right eye: No discharge.        Left eye: No discharge.     Extraocular Movements: Extraocular movements intact.     Conjunctiva/sclera: Conjunctivae normal.     Pupils: Pupils are equal, round, and reactive to light.  Cardiovascular:     Rate and Rhythm: Normal rate and regular rhythm.     Heart sounds: No murmur heard. Pulmonary:     Effort: Pulmonary effort is normal. No respiratory distress.     Breath sounds: Normal breath sounds. No wheezing, rhonchi or rales.  Abdominal:     General: Abdomen is flat. Bowel sounds are normal.  Musculoskeletal:     Cervical back: Normal range of motion and neck supple.  Skin:    General: Skin is warm and dry.     Capillary Refill: Capillary refill takes less than 2 seconds.  Neurological:     General: No focal deficit present.     Mental Status: He is alert and oriented to person, place, and time.  Psychiatric:        Mood and Affect: Mood normal.        Behavior:  Behavior normal.        Thought Content: Thought content normal.        Judgment: Judgment normal.     Results for orders placed or performed in visit on 04/15/22  TSH  Result Value Ref Range   TSH 0.601 0.450 - 4.500 uIU/mL  PSA  Result Value Ref Range   Prostate Specific Ag, Serum 0.4 0.0 - 4.0 ng/mL  Lipid panel  Result Value Ref Range   Cholesterol, Total 157 100 - 199 mg/dL   Triglycerides 173 (H) 0 - 149 mg/dL   HDL 41 >39 mg/dL   VLDL Cholesterol Cal 30 5 - 40 mg/dL   LDL Chol Calc (NIH) 86 0 - 99 mg/dL   Chol/HDL Ratio 3.8 0.0 - 5.0 ratio  CBC with Differential/Platelet  Result Value Ref Range   WBC 4.2 3.4 - 10.8 x10E3/uL   RBC 4.49 4.14 - 5.80 x10E6/uL   Hemoglobin 15.1 13.0 - 17.7 g/dL   Hematocrit 43.9 37.5 - 51.0 %   MCV 98 (H) 79 - 97 fL   MCH 33.6 (H) 26.6 - 33.0 pg   MCHC 34.4 31.5 - 35.7  g/dL   RDW 12.6 11.6 - 15.4 %   Platelets 332 150 - 450 x10E3/uL   Neutrophils 59 Not Estab. %   Lymphs 27 Not Estab. %   Monocytes 7 Not Estab. %   Eos 6 Not Estab. %   Basos 1 Not Estab. %   Neutrophils Absolute 2.6 1.4 - 7.0 x10E3/uL   Lymphocytes Absolute 1.1 0.7 - 3.1 x10E3/uL   Monocytes Absolute 0.3 0.1 - 0.9 x10E3/uL   EOS (ABSOLUTE) 0.2 0.0 - 0.4 x10E3/uL   Basophils Absolute 0.0 0.0 - 0.2 x10E3/uL   Immature Granulocytes 0 Not Estab. %   Immature Grans (Abs) 0.0 0.0 - 0.1 x10E3/uL  Comprehensive metabolic panel  Result Value Ref Range   Glucose 186 (H) 70 - 99 mg/dL   BUN 17 6 - 24 mg/dL   Creatinine, Ser 1.11 0.76 - 1.27 mg/dL   eGFR 78 >59 mL/min/1.73   BUN/Creatinine Ratio 15 9 - 20   Sodium 141 134 - 144 mmol/L   Potassium 4.2 3.5 - 5.2 mmol/L   Chloride 106 96 - 106 mmol/L   CO2 21 20 - 29 mmol/L   Calcium 8.7 8.7 - 10.2 mg/dL   Total Protein 6.6 6.0 - 8.5 g/dL   Albumin 4.2 3.8 - 4.9 g/dL   Globulin, Total 2.4 1.5 - 4.5 g/dL   Albumin/Globulin Ratio 1.8 1.2 - 2.2   Bilirubin Total 0.3 0.0 - 1.2 mg/dL   Alkaline Phosphatase 72 44 - 121 IU/L   AST 29 0 - 40 IU/L   ALT 43 0 - 44 IU/L  Urinalysis, Routine w reflex microscopic  Result Value Ref Range   Specific Gravity, UA >1.030 (H) 1.005 - 1.030   pH, UA 6.0 5.0 - 7.5   Color, UA Yellow Yellow   Appearance Ur Clear Clear   Leukocytes,UA Negative Negative   Protein,UA Negative Negative/Trace   Glucose, UA Negative Negative   Ketones, UA Negative Negative   RBC, UA Negative Negative   Bilirubin, UA Negative Negative   Urobilinogen, Ur 0.2 0.2 - 1.0 mg/dL   Nitrite, UA Negative Negative   Microscopic Examination Comment   HgB A1c  Result Value Ref Range   Hgb A1c MFr Bld 6.2 (H) 4.8 - 5.6 %   Est. average glucose Bld gHb Est-mCnc 131 mg/dL  Assessment & Plan:   Problem List Items Addressed This Visit       Cardiovascular and Mediastinum   Hypertension - Primary    Chronic. Not well  controlled.  Did not start Valsartan after last visit.  Did not take Amlodipine today.  Discussed with patient the importance of taking medications.  Requested patient bring medications to next visit.  Follow up in 1 month.  Call sooner if concerns arise.       Relevant Medications   valsartan (DIOVAN) 80 MG tablet     Endocrine   Type 2 diabetes mellitus with diabetic neuropathy, unspecified (HCC)    Chronic. Increase Farxiga to 10mg  to maximize benefits of medication.       Relevant Medications   valsartan (DIOVAN) 80 MG tablet   dapagliflozin propanediol (FARXIGA) 10 MG TABS tablet     Follow up plan: Return in about 1 month (around 08/21/2022) for BP Check.

## 2022-07-24 ENCOUNTER — Other Ambulatory Visit: Payer: Self-pay | Admitting: Nurse Practitioner

## 2022-07-24 NOTE — Telephone Encounter (Signed)
Requested Prescriptions  Pending Prescriptions Disp Refills   OZEMPIC, 0.25 OR 0.5 MG/DOSE, 2 MG/3ML SOPN [Pharmacy Med Name: OZEMPIC (0.25 OR 0.5 MG/DOSE) 2 MG/] 3 mL 0    Sig: INJECT 0.5MG  SUBCUTANEOUSLY ONCE A WEEK     Endocrinology:  Diabetes - GLP-1 Receptor Agonists - semaglutide Failed - 07/24/2022  1:59 PM      Failed - HBA1C in normal range and within 180 days    HB A1C (BAYER DCA - WAIVED)  Date Value Ref Range Status  10/08/2021 5.2 4.8 - 5.6 % Final    Comment:             Prediabetes: 5.7 - 6.4          Diabetes: >6.4          Glycemic control for adults with diabetes: <7.0    Hgb A1c MFr Bld  Date Value Ref Range Status  04/15/2022 6.2 (H) 4.8 - 5.6 % Final    Comment:             Prediabetes: 5.7 - 6.4          Diabetes: >6.4          Glycemic control for adults with diabetes: <7.0          Passed - Cr in normal range and within 360 days    Creatinine, Ser  Date Value Ref Range Status  04/15/2022 1.11 0.76 - 1.27 mg/dL Final         Passed - Valid encounter within last 6 months    Recent Outpatient Visits           Yesterday Primary hypertension   [place], Karen, NP   3 months ago Annual physical exam   [place] [name], NP   9 months ago Type 2 diabetes mellitus with diabetic neuropathy, with long-term current use of insulin ([place])   [place] [facility] Vigg, Avanti, MD   1 year ago Type 2 diabetes mellitus with diabetic neuropathy, with long-term current use of insulin ([place])   [place] Vigg, Avanti, MD   1 year ago Lower abdominal pain   [place] Vigg, Loman [place], MD       Future Appointments             In 4 weeks [name], NP [place], [facility]

## 2022-07-24 NOTE — Telephone Encounter (Signed)
Requested Prescriptions  Pending Prescriptions Disp Refills   atorvastatin (LIPITOR) 40 MG tablet [Pharmacy Med Name: ATORVASTATIN CALCIUM 40 MG TAB] 90 tablet 0    Sig: TAKE 1 TABLET BY MOUTH AT BEDTIME     Cardiovascular:  Antilipid - Statins Failed - 07/24/2022  2:00 PM      Failed - Lipid Panel in normal range within the last 12 months    Cholesterol, Total  Date Value Ref Range Status  04/15/2022 157 100 - 199 mg/dL Final   LDL Chol Calc (NIH)  Date Value Ref Range Status  04/15/2022 86 0 - 99 mg/dL Final   HDL  Date Value Ref Range Status  04/15/2022 41 >39 mg/dL Final   Triglycerides  Date Value Ref Range Status  04/15/2022 173 (H) 0 - 149 mg/dL Final         Passed - Patient is not pregnant      Passed - Valid encounter within last 12 months    Recent Outpatient Visits           Yesterday Primary hypertension   [place], Karen, NP   3 months ago Annual physical exam   [place] [name], NP   9 months ago Type 2 diabetes mellitus with diabetic neuropathy, with long-term current use of insulin ([place])   [place] [facility] Vigg, Avanti, MD   1 year ago Type 2 diabetes mellitus with diabetic neuropathy, with long-term current use of insulin ([place])   [place] Vigg, Avanti, MD   1 year ago Lower abdominal pain   [place] Vigg, Loman [place], MD       Future Appointments             In 4 weeks [name], NP [place], [facility]

## 2022-08-08 NOTE — Telephone Encounter (Addendum)
Pt states that he still does not have his medication dapagliflozin propanediol (FARXIGA) 10 MG TABS tablet .  Pt states that when he had his last visit with his PCP he was advised that they have received the Prior Authorization and was sending it over to his pharmacy. Pt called the pharmacy and was advised they never received the Prior Authorization.    Per pt he is almost out of medication.   [place], [place]. Phone: [phone]  Fax: [phone]      Pt is upset and is wanting a call back. Please advise.

## 2022-08-14 ENCOUNTER — Ambulatory Visit: Payer: Medicaid Other | Admitting: Podiatry

## 2022-08-15 ENCOUNTER — Other Ambulatory Visit: Payer: Medicaid Other

## 2022-08-15 NOTE — Progress Notes (Signed)
   08/15/2022  Patient ID: Lance Vaughan, male   DOB: 02/12/1966, 57 y.o.   MRN: [id number]  Subjective/Objective: Patient appearing on report for [employer] - Hypertension Control report due to last documented ambulatory blood pressure of 164/100 on 07/23/22. Next appointment with PCP is 08/21/22   Outreached patient to discuss hypertension control and medication management.   HTN Current antihypertensives: amlodipine 61m daily, valsartan 853m and metoprolol xl 2576maily -Patient does not have an automated upper arm home BP machine. -Patient denies side effects related to current regimen -States picked up and began taking valsartan after last PCP appointment on 07/23/22 DM Current medications: ozempic 0.5mg9mekly, farxiga 10mg24mly  -04/15/22 A1c 6.2, glucose 186 -Patient states farxiga 10mg 35mequiring a Prior Auth, so he has yet to pick up medication; but he had some 5mg on55mnd he is taking -Tolerating ozempic 0.5mg wel14mith no adverse effects -States FBG and 2h post-prandial range 117-124 -Does not endorse any s/sx of hypoglycemia HLD Current medications:  atorvastatin 40mg dai68m10/24/23 LDL 86, TG 173 Medication management and adherence -Patient endorses struggling with being able to pay all medication copays each month.  Pharmacy does allow a charge account, but will not allow additional charges if balance has not been paid.  Patient states this has caused gaps in adherence every few months.  Assessment/Plan: HTN -Insurance will pay for new BP monitor at a pharmacy that is able to bill for DME.  Patient's pharmacy does not, but I will see if he would like script sent elsewhere that does. -Recommend checking BP at least 2-3x/wk and recording. -If consistently >130/80, I would recommend increasing valsartan to 160mg dail50m -I recommend increasing Ozempic dosing to 1mg weekly78msed on tolerance and patient results.  Repeat CMP and A1c in 3 months. HLD -I recommend  increasing to atorvastatin 80mg daily 63mttempt LDL <70.  Repeat lipids and LFT's in 3months. Med56month management and adherence -Contacted [place] Mills[place] PA on Farxiga 10mg.  I will71me sure provider has received and is working on this.   -Pharmacy states patient has $47.11 balance from waived Medicaid copays that has been present for >/=90d, so he is unable to place any charges until paid -Reaching out to social work to see if they have resources around assistance with bill pay that may free up more money monthly for patient to be able to afford Medicaid copays  Follow-Up:  Called patient back to go over everything, and he requested a phone call Monday morning to discuss.  Scheduled for 9am 2/26.  Ari Engelbrecht A GentrDarlina Guys

## 2022-08-18 ENCOUNTER — Other Ambulatory Visit: Payer: Medicaid Other

## 2022-08-18 ENCOUNTER — Telehealth: Payer: Self-pay

## 2022-08-18 NOTE — Progress Notes (Signed)
   08/18/2022  Patient ID: Lance Vaughan, male   DOB: 04/13/1966, 57 y.o.   MRN: [id number]  Outreach attempt to follow-up with patient after our telephone visit Friday.  Patient initially answered but stated he could not hear me well and asked that I call him right back.  Attempted x2 but got no answer, so a voicemail was left for him to return my call.  Wanting to let patient know that the Wilder Glade PA is being worked on, and that I asked PCP to send script for Ozempic 92m weekly to pharmacy.  I have also asked social work to contact the patient to see how they may be able to assist with financial concerns.  Also need to see if there is another pharmacy (that can bill for DME) we could send a script for a new BP monitor to that patient would be willing/able to pick up from.  CDarlina Guys PharmD, DPLA

## 2022-08-21 ENCOUNTER — Other Ambulatory Visit: Payer: Medicaid Other

## 2022-08-21 ENCOUNTER — Ambulatory Visit: Payer: Medicaid Other | Admitting: Nurse Practitioner

## 2022-08-21 NOTE — Patient Instructions (Signed)
Thank you for taking time to speak with me today about care coordination and care management services available to you at no cost as part of your Medicaid benefit. These services are voluntary. Our team is available to provide assistance regarding your health care needs at any time. Please do not hesitate to reach out to me if we can be of service to you at any time in the future.   Mickel Fuchs, BSW, [place] Managed Medicaid Team  [phone]

## 2022-08-21 NOTE — Progress Notes (Deleted)
There were no vitals taken for this visit.   Subjective:    Patient ID: Lance Vaughan, male    DOB: 02-Jun-1966, 57 y.o.   MRN: [id number]  HPI: Lance Vaughan is a 57 y.o. male  No chief complaint on file.  HYPERTENSION {Blank single:19197::"without","with"} Chronic Kidney Disease Hypertension status: {Blank single:19197::"controlled","uncontrolled","better","worse","exacerbated","stable"}  Satisfied with current treatment? {Blank single:19197::"yes","no"} Duration of hypertension: {Blank single:19197::"chronic","months","years"} BP monitoring frequency:  {Blank single:19197::"not checking","rarely","daily","weekly","monthly","a few times a day","a few times a week","a few times a month"} BP range:  BP medication side effects:  {Blank single:19197::"yes","no"} Medication compliance: {Blank single:19197::"excellent compliance","good compliance","fair compliance","poor compliance"} Previous BP meds:{Blank [id number] (bystolic)","carvedilol","chlorthalidone","clonidine","diltiazem","exforge HCT","HCTZ","irbesartan (avapro)","labetalol","lisinopril","lisinopril-HCTZ","losartan (cozaar)","methyldopa","nifedipine","olmesartan (benicar)","olmesartan-HCTZ","quinapril","ramipril","spironalactone","tekturna","valsartan","valsartan-HCTZ","verapamil"} Aspirin: {Blank single:19197::"yes","no"} Recurrent headaches: {Blank single:19197::"yes","no"} Visual changes: {Blank single:19197::"yes","no"} Palpitations: {Blank single:19197::"yes","no"} Dyspnea: {Blank single:19197::"yes","no"} Chest pain: {Blank single:19197::"yes","no"} Lower extremity edema: {Blank single:19197::"yes","no"} Dizzy/lightheaded: {Blank single:19197::"yes","no"}  Relevant past medical, surgical, family and social history reviewed and updated as indicated. Interim medical history since our last visit reviewed. Allergies and  medications reviewed and updated.  Review of Systems  Per HPI unless specifically indicated above     Objective:    There were no vitals taken for this visit.  Wt Readings from Last 3 Encounters:  07/23/22 232 Lance 9.6 oz (105.5 kg)  04/15/22 234 Lance 12.8 oz (106.5 kg)  10/08/21 235 Lance 9.6 oz (106.9 kg)    Physical Exam  Results for orders placed or performed in visit on 04/15/22  TSH  Result Value Ref Range   TSH 0.601 0.450 - 4.500 uIU/mL  PSA  Result Value Ref Range   Prostate Specific Ag, Serum 0.4 0.0 - 4.0 ng/mL  Lipid panel  Result Value Ref Range   Cholesterol, Total 157 100 - 199 mg/dL   Triglycerides 173 (H) 0 - 149 mg/dL   HDL 41 >39 mg/dL   VLDL Cholesterol Cal 30 5 - 40 mg/dL   LDL Chol Calc (NIH) 86 0 - 99 mg/dL   Chol/HDL Ratio 3.8 0.0 - 5.0 ratio  CBC with Differential/Platelet  Result Value Ref Range   WBC 4.2 3.4 - 10.8 x10E3/uL   RBC 4.49 4.14 - 5.80 x10E6/uL   Hemoglobin 15.1 13.0 - 17.7 g/dL   Hematocrit 43.9 37.5 - 51.0 %   MCV 98 (H) 79 - 97 fL   MCH 33.6 (H) 26.6 - 33.0 pg   MCHC 34.4 31.5 - 35.7 g/dL   RDW 12.6 11.6 - 15.4 %   Platelets 332 150 - 450 x10E3/uL   Neutrophils 59 Not Estab. %   Lymphs 27 Not Estab. %   Monocytes 7 Not Estab. %   Eos 6 Not Estab. %   Basos 1 Not Estab. %   Neutrophils Absolute 2.6 1.4 - 7.0 x10E3/uL   Lymphocytes Absolute 1.1 0.7 - 3.1 x10E3/uL   Monocytes Absolute 0.3 0.1 - 0.9 x10E3/uL   EOS (ABSOLUTE) 0.2 0.0 - 0.4 x10E3/uL   Basophils Absolute 0.0 0.0 - 0.2 x10E3/uL   Immature Granulocytes 0 Not Estab. %   Immature Grans (Abs) 0.0 0.0 - 0.1 x10E3/uL  Comprehensive metabolic panel  Result Value Ref Range   Glucose 186 (H) 70 - 99 mg/dL   BUN 17 6 - 24 mg/dL   Creatinine, Ser 1.11 0.76 - 1.27 mg/dL   eGFR 78 >59 mL/min/1.73   BUN/Creatinine Ratio 15 9 - 20   Sodium 141 134 - 144 mmol/L   Potassium 4.2 3.5 - 5.2 mmol/L   Chloride 106 96 - 106 mmol/L   CO2 21  20 - 29 mmol/L   Calcium 8.7 8.7 - 10.2  mg/dL   Total Protein 6.6 6.0 - 8.5 g/dL   Albumin 4.2 3.8 - 4.9 g/dL   Globulin, Total 2.4 1.5 - 4.5 g/dL   Albumin/Globulin Ratio 1.8 1.2 - 2.2   Bilirubin Total 0.3 0.0 - 1.2 mg/dL   Alkaline Phosphatase 72 44 - 121 IU/L   AST 29 0 - 40 IU/L   ALT 43 0 - 44 IU/L  Urinalysis, Routine w reflex microscopic  Result Value Ref Range   Specific Gravity, UA >1.030 (H) 1.005 - 1.030   pH, UA 6.0 5.0 - 7.5   Color, UA Yellow Yellow   Appearance Ur Clear Clear   Leukocytes,UA Negative Negative   Protein,UA Negative Negative/Trace   Glucose, UA Negative Negative   Ketones, UA Negative Negative   RBC, UA Negative Negative   Bilirubin, UA Negative Negative   Urobilinogen, Ur 0.2 0.2 - 1.0 mg/dL   Nitrite, UA Negative Negative   Microscopic Examination Comment   HgB A1c  Result Value Ref Range   Hgb A1c MFr Bld 6.2 (H) 4.8 - 5.6 %   Est. average glucose Bld gHb Est-mCnc 131 mg/dL      Assessment & Plan:   Problem List Items Addressed This Visit       Cardiovascular and Mediastinum   Hypertension - Primary     Follow up plan: No follow-ups on file.

## 2022-08-21 NOTE — Patient Outreach (Signed)
Medicaid Managed Care Social Work Note  08/21/2022 Name:  Lance Vaughan MRN:  [id number] DOB:  [birth year]  Lance Vaughan is an 57 y.o. year old male who is a primary patient of [name], NP.  The [facility] Managed Care Coordination team was consulted for assistance with:   medication assistance  [name] was given information about Medicaid Managed Care Coordination team services today. Lance Vaughan Patient agreed to services and verbal consent obtained.  Engaged with patient  for by telephone forinitial visit in response to referral for case management and/or care coordination services.   Assessments/Interventions:  Review of past medical history, allergies, medications, health status, including review of consultants reports, laboratory and other test data, was performed as part of comprehensive evaluation and provision of chronic care management services.  SDOH: (Social Determinant of Health) assessments and interventions performed: SDOH Interventions    Flowsheet Row Office Visit from 04/15/2022 in [place]  SDOH Interventions   Depression Interventions/Treatment  Patient refuses Treatment     BSW completed a telephone outreach with patient, he states he is on a fixed income and needs assistance with his medications. Patient states he has received several resources and none of them can help. Patient states he pays about 500 in rent and has to choose between eating or medications. He also has to pay for rides. He is unable to get foodstamps. Patient has already received all resources BSW would send.  Advanced Directives Status:  Not addressed in this encounter.  Care Plan                 Allergies  Allergen Reactions   Lisinopril Other (See Comments)    Chest pain    Medications Reviewed Today     Reviewed by Darlina Guys, [facility] (Pharmacist) on 08/15/22 at [id number]  Med List Status: <None>   Medication Order Taking? Sig Documenting Provider Last  Dose Status Informant  Accu-Chek Softclix Lancets lancets [id number] Yes 1 each by Other route daily. Use as instructed [name], NP Taking Active   albuterol (VENTOLIN HFA) 108 (90 Base) MCG/ACT inhaler [id number] Yes Inhale 2 puffs into the lungs every 6 (six) hours as needed for wheezing or shortness of breath. Rescue inhaler [name], NP Taking Active   amLODipine (NORVASC) 10 MG tablet [id number] Yes TAKE 1 TABLET BY MOUTH ONCE DAILY Vaughan, Lance T, NP Taking Active   aspirin EC 81 MG tablet [id number] Yes Take 1 tablet (81 mg total) by mouth daily. Lance Quan, NP Taking Active Self  atorvastatin (LIPITOR) 40 MG tablet [id number] Yes TAKE 1 TABLET BY MOUTH AT BEDTIME Vaughan, Lance T, NP Taking Active   Blood Glucose Monitoring Suppl (ACCU-CHEK AVIVA PLUS) w/Device KIT [id number] Yes 1 each by Does not apply route daily. [name], NP Taking Active   dapagliflozin propanediol (FARXIGA) 10 MG TABS tablet [id number] Yes Take 1 tablet (10 mg total) by mouth daily. needs appointment for further refills [name], NP Taking Active   gabapentin (NEURONTIN) 600 MG tablet [id number] Yes Take 1 tablet (600 mg total) by mouth 3 (three) times daily as needed. [name], NP Taking Active   glucose blood (ACCU-CHEK AVIVA PLUS) test strip [id number] Yes TEST TWICE DAILY [name], NP Taking Active   ibuprofen (ADVIL) 800 MG tablet [id number] Yes Take 1 tablet (800 mg total) by mouth every 8 (eight) hours as needed. Lance Bacon, MD Taking Active   metoprolol succinate (TOPROL-XL) 25 MG 24 hr tablet [id number]  Yes TAKE 1 TABLET BY MOUTH ONCE DAILY Vaughan, Lance P, DO Taking Active   polyethylene glycol powder (GLYCOLAX/MIRALAX) 17 GM/SCOOP powder [id number] Yes Take 17 g by mouth 2 (two) times daily as needed. Lance Cousins, MD Taking Active   Semaglutide,0.25 or 0.'5MG'$ /DOS, (OZEMPIC, 0.25 OR 0.5 MG/DOSE,) 2 MG/3ML SOPN [id number] Yes INJECT 0.'5MG'$   SUBCUTANEOUSLY ONCE A Lance Sane, NP Taking Active   ULTRACARE PEN NEEDLES 32G X 4 MM MISC [id number] Yes USE 3 TIMES DAILY AS NEEDED [name], NP Taking Active   valsartan (DIOVAN) 80 MG tablet [id number] Yes Take 1 tablet (80 mg total) by mouth daily. 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   Knee effusion, left 01/20/2019   Rectal bleeding    Major depression, recurrent ([place]) 02/25/2018   Bipolar disorder ([place]) 02/25/2018   Cigarette smoker 02/25/2018   Morbid obesity ([place]) 02/25/2018   Erectile dysfunction 02/25/2018   Constipation 02/25/2018   Bloody stool 02/25/2018   Hyperlipidemia 08/20/2017   Type 2 diabetes mellitus with diabetic neuropathy, unspecified ([place]) 08/20/2017   Hypertension 08/20/2017   Pneumococcal vaccine refused 12/18/2015   Diverticulosis of colon 07/26/2015    Conditions to be addressed/monitored per PCP order:   medication assistance  There are no care plans that you recently modified to display for this patient.   Follow up:  Patient requests no follow-up at this time.     Lance Vaughan, BSW, [place] Managed Medicaid Team  [phone]

## 2022-09-24 ENCOUNTER — Other Ambulatory Visit: Payer: Self-pay | Admitting: Nurse Practitioner

## 2022-09-24 NOTE — Telephone Encounter (Signed)
Requested Prescriptions  Pending Prescriptions Disp Refills   OZEMPIC, 0.25 OR 0.5 MG/DOSE, 2 MG/3ML SOPN [Pharmacy Med Name: OZEMPIC (0.25 OR 0.5 MG/DOSE) 2 MG/] 3 mL 0    Sig: INJECT 0.5MG  SUBCUTANEOUSLY ONCE A WEEK     Endocrinology:  Diabetes - GLP-1 Receptor Agonists - semaglutide Failed - 09/24/2022  1:46 PM      Failed - HBA1C in normal range and within 180 days    HB A1C (BAYER DCA - WAIVED)  Date Value Ref Range Status  10/08/2021 5.2 4.8 - 5.6 % Final    Comment:             Prediabetes: 5.7 - 6.4          Diabetes: >6.4          Glycemic control for adults with diabetes: <7.0    Hgb A1c MFr Bld  Date Value Ref Range Status  04/15/2022 6.2 (H) 4.8 - 5.6 % Final    Comment:             Prediabetes: 5.7 - 6.4          Diabetes: >6.4          Glycemic control for adults with diabetes: <7.0          Passed - Cr in normal range and within 360 days    Creatinine, Ser  Date Value Ref Range Status  04/15/2022 1.11 0.76 - 1.27 mg/dL Final         Passed - Valid encounter within last 6 months    Recent Outpatient Visits           2 months ago Primary hypertension   [place], Karen, NP   5 months ago Annual physical exam   [place] [name], NP   11 months ago Type 2 diabetes mellitus with diabetic neuropathy, with long-term current use of insulin ([place])   [facility] Vigg, Avanti, MD   1 year ago Type 2 diabetes mellitus with diabetic neuropathy, with long-term current use of insulin ([place])   [facility] Vigg, Avanti, MD   1 year ago Lower abdominal pain    [facility] [name], MD

## 2022-09-25 ENCOUNTER — Other Ambulatory Visit: Payer: Self-pay | Admitting: Nurse Practitioner

## 2022-09-25 NOTE — Telephone Encounter (Signed)
Medication Refill - Medication: dapagliflozin propanediol (FARXIGA) 10 MG TABS tablet   Has the patient contacted their pharmacy? Yes. Pharmacy advised pt they do not have the medication refill request.   Preferred Pharmacy (with phone number or street name):  [facility] [place], [place]. Phone: [phone]  Fax: [phone]     Has the patient been seen for an appointment in the last year OR does the patient have an upcoming appointment? Yes.    Agent: Please be advised that RX refills may take up to 3 business days. We ask that you follow-up with your pharmacy.

## 2022-09-25 NOTE — Telephone Encounter (Signed)
Unable to refill per protocol, Rx request is too soon. Last refill 07/23/22 for 90 and 1 refill.  Requested Prescriptions  Pending Prescriptions Disp Refills   dapagliflozin propanediol (FARXIGA) 10 MG TABS tablet 90 tablet 1    Sig: Take 1 tablet (10 mg total) by mouth daily. needs appointment for further refills     Endocrinology:  Diabetes - SGLT2 Inhibitors Passed - 09/25/2022  2:07 PM      Passed - Cr in normal range and within 360 days    Creatinine, Ser  Date Value Ref Range Status  04/15/2022 1.11 0.76 - 1.27 mg/dL Final         Passed - HBA1C is between 0 and 7.9 and within 180 days    HB A1C (BAYER DCA - WAIVED)  Date Value Ref Range Status  10/08/2021 5.2 4.8 - 5.6 % Final    Comment:             Prediabetes: 5.7 - 6.4          Diabetes: >6.4          Glycemic control for adults with diabetes: <7.0    Hgb A1c MFr Bld  Date Value Ref Range Status  04/15/2022 6.2 (H) 4.8 - 5.6 % Final    Comment:             Prediabetes: 5.7 - 6.4          Diabetes: >6.4          Glycemic control for adults with diabetes: <7.0          Passed - eGFR in normal range and within 360 days    GFR calc Af Amer  Date Value Ref Range Status  02/14/2020 78 >59 mL/min/1.73 Final    Comment:    **[facility] currently reports eGFR in compliance with the current**   recommendations of the [employer]. [facility] will   update reporting as new guidelines are published from the NKF-ASN   Task force.    GFR calc non Af Amer  Date Value Ref Range Status  02/14/2020 67 >59 mL/min/1.73 Final   eGFR  Date Value Ref Range Status  04/15/2022 78 >59 mL/min/1.73 Final         Passed - Valid encounter within last 6 months    Recent Outpatient Visits           2 months ago Primary hypertension   [place], NP   5 months ago Annual physical exam   [place] [name], NP   11 months ago Type 2 diabetes  mellitus with diabetic neuropathy, with long-term current use of insulin ([place])   [facility] Vigg, Avanti, MD   1 year ago Type 2 diabetes mellitus with diabetic neuropathy, with long-term current use of insulin ([place])   [facility] Vigg, Avanti, MD   1 year ago Lower abdominal pain   [place] Vigg, Loman [place], MD       Future Appointments             In 6 days [name], NP [place]

## 2022-10-01 ENCOUNTER — Ambulatory Visit (INDEPENDENT_AMBULATORY_CARE_PROVIDER_SITE_OTHER): Payer: Medicaid Other | Admitting: Nurse Practitioner

## 2022-10-01 ENCOUNTER — Encounter: Payer: Self-pay | Admitting: Nurse Practitioner

## 2022-10-01 VITALS — BP 137/89 | HR 61 | Temp 97.9°F | Ht 72.01 in | Wt 233.6 lb

## 2022-10-01 DIAGNOSIS — E782 Mixed hyperlipidemia: Secondary | ICD-10-CM

## 2022-10-01 DIAGNOSIS — I10 Essential (primary) hypertension: Secondary | ICD-10-CM | POA: Diagnosis not present

## 2022-10-01 DIAGNOSIS — R079 Chest pain, unspecified: Secondary | ICD-10-CM

## 2022-10-01 DIAGNOSIS — F331 Major depressive disorder, recurrent, moderate: Secondary | ICD-10-CM | POA: Diagnosis not present

## 2022-10-01 DIAGNOSIS — E1140 Type 2 diabetes mellitus with diabetic neuropathy, unspecified: Secondary | ICD-10-CM | POA: Diagnosis not present

## 2022-10-01 DIAGNOSIS — F17200 Nicotine dependence, unspecified, uncomplicated: Secondary | ICD-10-CM

## 2022-10-01 DIAGNOSIS — Z794 Long term (current) use of insulin: Secondary | ICD-10-CM

## 2022-10-01 MED ORDER — DAPAGLIFLOZIN PROPANEDIOL 10 MG PO TABS: 10.0000 mg | ORAL_TABLET | Freq: Every day | ORAL | 1 refills | Status: DC

## 2022-10-01 NOTE — Assessment & Plan Note (Signed)
New referral placed for CT Lung screening.

## 2022-10-01 NOTE — Progress Notes (Signed)
BP 137/89   Pulse 61   Temp 97.9 F (36.6 C) (Oral)   Ht 6' 0.01" (1.829 m)   Wt 233 lb 9.6 oz (106 kg)   SpO2 98%   BMI 31.67 kg/m    Subjective:    Patient ID: Lance Vaughan, male    DOB: September 01, 1965, 57 y.o.   MRN: [id number]  HPI: Lance Vaughan is a 57 y.o. male  Chief Complaint  Patient presents with   Hypertension   Diabetes   HYPERTENSION with Chronic Kidney Disease Hypertension status: controlled  Satisfied with current treatment? yes Duration of hypertension: years BP monitoring frequency:  not checking BP range:  BP medication side effects:  no Medication compliance: excellent compliance Previous BP meds: metoprolol, amlodipine, and valsartan Aspirin: no Recurrent headaches: yes Visual changes: no Palpitations: no Dyspnea: no Chest pain: yes Lower extremity edema: no Dizzy/lightheaded: yes  DIABETES Patient states he still has Neuropathy.  The Gabapentin doesn't help with his symptoms. He is only taking it at night right now Hypoglycemic episodes:no Polydipsia/polyuria: no Visual disturbance: yes Chest pain: yes Paresthesias: yes Glucose Monitoring: yes  Accucheck frequency: Daily  Fasting glucose: 109  Post prandial:  Evening:  Before meals: Taking Insulin?: no  Long acting insulin:  Short acting insulin: Blood Pressure Monitoring: not checking Retinal Examination: Not up to Date Foot Exam: Up to Date Diabetic Education: Not Completed Pneumovax: Up to Date Influenza: Up to Date Aspirin: no  CHEST PAIN Duration: month Onset: gradual Description of breathing discomfort: none Severity: moderate Episode duration: lasts till he falls asleep Frequency: almost everyday Related to exertion: no Cough: no Chest tightness:  sometimes Wheezing: no Fevers: no Chest pain: yes Palpitations: yes  Nausea: no Diaphoresis: no Deconditioning: no Status: stable Worse after he eats.     Relevant past medical, surgical, family and social history  reviewed and updated as indicated. Interim medical history since our last visit reviewed. Allergies and medications reviewed and updated.  Review of Systems  Eyes:  Negative for visual disturbance.  Respiratory:  Positive for chest tightness and shortness of breath.   Cardiovascular:  Positive for chest pain. Negative for palpitations and leg swelling.  Endocrine: Negative for polydipsia and polyuria.  Neurological:  Positive for dizziness and headaches. Negative for light-headedness and numbness.    Per HPI unless specifically indicated above     Objective:    BP 137/89   Pulse 61   Temp 97.9 F (36.6 C) (Oral)   Ht 6' 0.01" (1.829 m)   Wt 233 lb 9.6 oz (106 kg)   SpO2 98%   BMI 31.67 kg/m   Wt Readings from Last 3 Encounters:  10/01/22 233 lb 9.6 oz (106 kg)  07/23/22 232 lb 9.6 oz (105.5 kg)  04/15/22 234 lb 12.8 oz (106.5 kg)    Physical Exam Vitals and nursing note reviewed.  Constitutional:      General: He is not in acute distress.    Appearance: Normal appearance. He is not ill-appearing, toxic-appearing or diaphoretic.  HENT:     Head: Normocephalic.     Right Ear: External ear normal.     Left Ear: External ear normal.     Nose: Nose normal. No congestion or rhinorrhea.     Mouth/Throat:     Mouth: Mucous membranes are moist.  Eyes:     General:        Right eye: No discharge.        Left eye: No discharge.  Extraocular Movements: Extraocular movements intact.     Conjunctiva/sclera: Conjunctivae normal.     Pupils: Pupils are equal, round, and reactive to light.  Cardiovascular:     Rate and Rhythm: Normal rate and regular rhythm.     Heart sounds: No murmur heard. Pulmonary:     Effort: Pulmonary effort is normal. No respiratory distress.     Breath sounds: Normal breath sounds. No wheezing, rhonchi or rales.  Abdominal:     General: Abdomen is flat. Bowel sounds are normal.  Musculoskeletal:     Cervical back: Normal range of motion and neck  supple.  Skin:    General: Skin is warm and dry.     Capillary Refill: Capillary refill takes less than 2 seconds.  Neurological:     General: No focal deficit present.     Mental Status: He is alert and oriented to person, place, and time.  Psychiatric:        Mood and Affect: Mood normal.        Behavior: Behavior normal.        Thought Content: Thought content normal.        Judgment: Judgment normal.     Results for orders placed or performed in visit on 04/15/22  TSH  Result Value Ref Range   TSH 0.601 0.450 - 4.500 uIU/mL  PSA  Result Value Ref Range   Prostate Specific Ag, Serum 0.4 0.0 - 4.0 ng/mL  Lipid panel  Result Value Ref Range   Cholesterol, Total 157 100 - 199 mg/dL   Triglycerides 993 (H) 0 - 149 mg/dL   HDL 41 >71 mg/dL   VLDL Cholesterol Cal 30 5 - 40 mg/dL   LDL Chol Calc (NIH) 86 0 - 99 mg/dL   Chol/HDL Ratio 3.8 0.0 - 5.0 ratio  CBC with Differential/Platelet  Result Value Ref Range   WBC 4.2 3.4 - 10.8 x10E3/uL   RBC 4.49 4.14 - 5.80 x10E6/uL   Hemoglobin 15.1 13.0 - 17.7 g/dL   Hematocrit 69.6 78.9 - 51.0 %   MCV 98 (H) 79 - 97 fL   MCH 33.6 (H) 26.6 - 33.0 pg   MCHC 34.4 31.5 - 35.7 g/dL   RDW 38.1 01.7 - 51.0 %   Platelets 332 150 - 450 x10E3/uL   Neutrophils 59 Not Estab. %   Lymphs 27 Not Estab. %   Monocytes 7 Not Estab. %   Eos 6 Not Estab. %   Basos 1 Not Estab. %   Neutrophils Absolute 2.6 1.4 - 7.0 x10E3/uL   Lymphocytes Absolute 1.1 0.7 - 3.1 x10E3/uL   Monocytes Absolute 0.3 0.1 - 0.9 x10E3/uL   EOS (ABSOLUTE) 0.2 0.0 - 0.4 x10E3/uL   Basophils Absolute 0.0 0.0 - 0.2 x10E3/uL   Immature Granulocytes 0 Not Estab. %   Immature Grans (Abs) 0.0 0.0 - 0.1 x10E3/uL  Comprehensive metabolic panel  Result Value Ref Range   Glucose 186 (H) 70 - 99 mg/dL   BUN 17 6 - 24 mg/dL   Creatinine, Ser 2.58 0.76 - 1.27 mg/dL   eGFR 78 >52 DP/OEU/2.35   BUN/Creatinine Ratio 15 9 - 20   Sodium 141 134 - 144 mmol/L   Potassium 4.2 3.5 - 5.2  mmol/L   Chloride 106 96 - 106 mmol/L   CO2 21 20 - 29 mmol/L   Calcium 8.7 8.7 - 10.2 mg/dL   Total Protein 6.6 6.0 - 8.5 g/dL   Albumin 4.2 3.8 - 4.9 g/dL  Globulin, Total 2.4 1.5 - 4.5 g/dL   Albumin/Globulin Ratio 1.8 1.2 - 2.2   Bilirubin Total 0.3 0.0 - 1.2 mg/dL   Alkaline Phosphatase 72 44 - 121 IU/L   AST 29 0 - 40 IU/L   ALT 43 0 - 44 IU/L  Urinalysis, Routine w reflex microscopic  Result Value Ref Range   Specific Gravity, UA >1.030 (H) 1.005 - 1.030   pH, UA 6.0 5.0 - 7.5   Color, UA Yellow Yellow   Appearance Ur Clear Clear   Leukocytes,UA Negative Negative   Protein,UA Negative Negative/Trace   Glucose, UA Negative Negative   Ketones, UA Negative Negative   RBC, UA Negative Negative   Bilirubin, UA Negative Negative   Urobilinogen, Ur 0.2 0.2 - 1.0 mg/dL   Nitrite, UA Negative Negative   Microscopic Examination Comment   HgB A1c  Result Value Ref Range   Hgb A1c MFr Bld 6.2 (H) 4.8 - 5.6 %   Est. average glucose Bld gHb Est-mCnc 131 mg/dL      Assessment & Plan:   Problem List Items Addressed This Visit       Cardiovascular and Mediastinum   Hypertension - Primary    Chronic.  Controlled.  Continue with current medication regimen of Amlodipine, Valsartan and Metoprolol.  Labs ordered today.  Return to clinic in 6 months for reevaluation.  Call sooner if concerns arise.        Relevant Orders   Comp Met (CMET)     Endocrine   Type 2 diabetes mellitus with diabetic neuropathy, unspecified    Chronic.  Controlled.  Last A1c in October was 6.2%.  Not able to pick up Farxiga from the pharmacy due to having a bill.  States he needs a referral for eye exam- referral placed today.  Followed by Podiatry. Labs ordered today.  Follow up in 6 months.  Call sooner if concerns arise.        Relevant Medications   dapagliflozin propanediol (FARXIGA) 10 MG TABS tablet   Other Relevant Orders   HgB A1c   Microalbumin, Urine Waived   Ambulatory referral to  Ophthalmology     Other   Hyperlipidemia    Chronic.  Controlled.  Continue with current medication regimen of atorvastatin 40mg  daily.  Labs ordered today.  Return to clinic in 6 months for reevaluation.  Call sooner if concerns arise.        Relevant Orders   Lipid Profile   Major depression, recurrent    Chronic. Not currently on medication.  Does not feel like he needs it.  Does not want a referral to psychiatry at this time.        Morbid obesity    Secondary to type 2 diabetes and HTN.      Relevant Medications   dapagliflozin propanediol (FARXIGA) 10 MG TABS tablet   Chest pain    Ongoing x several months.  Recommend he follow up with Cardiology.  Has not seen them since 2021.  Does not want to start medication for GERD.  States his pain has worsened since he last saw Cardiology.  Encouraged patient to make an appt.      Current every day smoker    New referral placed for CT Lung screening.      Relevant Orders   Ambulatory Referral Lung Cancer Screening [place] [facility]     Follow up plan: Return in about 6 months (around 04/02/2023) for Physical and Fasting labs.

## 2022-10-01 NOTE — Assessment & Plan Note (Signed)
Chronic. Not currently on medication.  Does not feel like he needs it.  Does not want a referral to psychiatry at this time.   

## 2022-10-01 NOTE — Assessment & Plan Note (Signed)
Secondary to type 2 diabetes and HTN.

## 2022-10-01 NOTE — Assessment & Plan Note (Signed)
Chronic.  Controlled.  Continue with current medication regimen of atorvastatin 40mg  daily.  Labs ordered today.  Return to clinic in 6 months for reevaluation.  Call sooner if concerns arise.

## 2022-10-01 NOTE — Assessment & Plan Note (Signed)
Ongoing x several months.  Recommend he follow up with Cardiology.  Has not seen them since 2021.  Does not want to start medication for GERD.  States his pain has worsened since he last saw Cardiology.  Encouraged patient to make an appt.

## 2022-10-01 NOTE — Assessment & Plan Note (Signed)
Chronic.  Controlled.  Continue with current medication regimen of Amlodipine, Valsartan and Metoprolol.  Labs ordered today.  Return to clinic in 6 months for reevaluation.  Call sooner if concerns arise.

## 2022-10-01 NOTE — Assessment & Plan Note (Signed)
Chronic.  Controlled.  Last A1c in October was 6.2%.  Not able to pick up Farxiga from the pharmacy due to having a bill.  States he needs a referral for eye exam- referral placed today.  Followed by Podiatry. Labs ordered today.  Follow up in 6 months.  Call sooner if concerns arise.

## 2022-10-02 LAB — COMPREHENSIVE METABOLIC PANEL
ALT: 40 IU/L (ref 0–44)
AST: 27 IU/L (ref 0–40)
Albumin/Globulin Ratio: 1.7 (ref 1.2–2.2)
Albumin: 4.1 g/dL (ref 3.8–4.9)
Alkaline Phosphatase: 73 IU/L (ref 44–121)
BUN/Creatinine Ratio: 16 (ref 9–20)
BUN: 16 mg/dL (ref 6–24)
Bilirubin Total: <0.2 mg/dL (ref 0.0–1.2)
CO2: 19 mmol/L — ABNORMAL LOW (ref 20–29)
Calcium: 8.9 mg/dL (ref 8.7–10.2)
Chloride: 107 mmol/L — ABNORMAL HIGH (ref 96–106)
Creatinine, Ser: 0.99 mg/dL (ref 0.76–1.27)
Globulin, Total: 2.4 g/dL (ref 1.5–4.5)
Glucose: 222 mg/dL — ABNORMAL HIGH (ref 70–99)
Potassium: 3.9 mmol/L (ref 3.5–5.2)
Sodium: 140 mmol/L (ref 134–144)
Total Protein: 6.5 g/dL (ref 6.0–8.5)
eGFR: 89 mL/min/{1.73_m2} (ref >59)

## 2022-10-02 LAB — HEMOGLOBIN A1C
Est. average glucose Bld gHb Est-mCnc: 126 mg/dL
Hgb A1c MFr Bld: 6 % — ABNORMAL HIGH (ref 4.8–5.6)

## 2022-10-02 LAB — LIPID PANEL
Chol/HDL Ratio: 3.1 ratio (ref 0.0–5.0)
Cholesterol, Total: 129 mg/dL (ref 100–199)
HDL: 42 mg/dL (ref >39)
LDL Chol Calc (NIH): 47 mg/dL (ref 0–99)
Triglycerides: 255 mg/dL — ABNORMAL HIGH (ref 0–149)
VLDL Cholesterol Cal: 40 mg/dL (ref 5–40)

## 2022-10-02 NOTE — Progress Notes (Signed)
Please let patient know that his lab work looks good.  His triglycerides are high.  I recommend decreasing processed foods and refined sugar intake. His A1c is well controlled at 6.0.  Continue with current medication regimen.  Follow up as discussed.

## 2022-10-17 ENCOUNTER — Other Ambulatory Visit: Payer: Self-pay | Admitting: Nurse Practitioner

## 2022-10-17 NOTE — Telephone Encounter (Signed)
Unable to refill per protocol, Rx request was refilled 10/17/22, duplicate request.  Requested Prescriptions  Pending Prescriptions Disp Refills   Semaglutide,0.25 or 0.5MG /DOS, (OZEMPIC, 0.25 OR 0.5 MG/DOSE,) 2 MG/3ML SOPN 3 mL 0     Endocrinology:  Diabetes - GLP-1 Receptor Agonists - semaglutide Failed - 10/17/2022  2:03 PM      Failed - HBA1C in normal range and within 180 days    HB A1C (BAYER DCA - WAIVED)  Date Value Ref Range Status  10/08/2021 5.2 4.8 - 5.6 % Final    Comment:             Prediabetes: 5.7 - 6.4          Diabetes: >6.4          Glycemic control for adults with diabetes: <7.0    Hgb A1c MFr Bld  Date Value Ref Range Status  10/01/2022 6.0 (H) 4.8 - 5.6 % Final    Comment:             Prediabetes: 5.7 - 6.4          Diabetes: >6.4          Glycemic control for adults with diabetes: <7.0          Passed - Cr in normal range and within 360 days    Creatinine, Ser  Date Value Ref Range Status  10/01/2022 0.99 0.76 - 1.27 mg/dL Final         Passed - Valid encounter within last 6 months    Recent Outpatient Visits           2 weeks ago Primary hypertension   [place] [facility] Larae Grooms, NP   2 months ago Primary hypertension   [place] [facility] Larae Grooms, [facility]   6 months ago Annual physical exam   [place] [facility] Larae Grooms, NP   1 year ago Type 2 diabetes mellitus with diabetic neuropathy, with long-term current use of insulin (HCC)   [place] [facility] Vigg, Avanti, MD   1 year ago Type 2 diabetes mellitus with diabetic neuropathy, with long-term current use of insulin (HCC)   [facility] Loura Pardon, MD

## 2022-10-17 NOTE — Telephone Encounter (Signed)
Medication Refill - Medication: OZEMPIC, 0.25 OR 0.5 MG/DOSE, 2 MG/3ML SOPN   Has the patient contacted their pharmacy? Yes.     Preferred Pharmacy (with phone number or street name):  [facility] - [place], [place] - [address] Phone: [phone]  Fax: [phone]     Has the patient been seen for an appointment in the last year OR does the patient have an upcoming appointment? Yes.     The patient just took his last dose and is now completely out.  Please assist patient further

## 2022-10-17 NOTE — Telephone Encounter (Signed)
Requested Prescriptions  Pending Prescriptions Disp Refills   OZEMPIC, 0.25 OR 0.5 MG/DOSE, 2 MG/3ML SOPN [Pharmacy Med Name: OZEMPIC (0.25 OR 0.5 MG/DOSE) 2 MG/] 3 mL 0    Sig: INJECT 0.5MG  SUBCUTANEOUSLY ONCE A WEEK     Endocrinology:  Diabetes - GLP-1 Receptor Agonists - semaglutide Failed - 10/17/2022  1:00 PM      Failed - HBA1C in normal range and within 180 days    HB A1C (BAYER DCA - WAIVED)  Date Value Ref Range Status  10/08/2021 5.2 4.8 - 5.6 % Final    Comment:             Prediabetes: 5.7 - 6.4          Diabetes: >6.4          Glycemic control for adults with diabetes: <7.0    Hgb A1c MFr Bld  Date Value Ref Range Status  10/01/2022 6.0 (H) 4.8 - 5.6 % Final    Comment:             Prediabetes: 5.7 - 6.4          Diabetes: >6.4          Glycemic control for adults with diabetes: <7.0          Passed - Cr in normal range and within 360 days    Creatinine, Ser  Date Value Ref Range Status  10/01/2022 0.99 0.76 - 1.27 mg/dL Final         Passed - Valid encounter within last 6 months    Recent Outpatient Visits           2 weeks ago Primary hypertension   [place] [facility] Larae G[facility], NP   2 months ago Primary hypertension   [place] [facility] G[facility], NP   6 months ago Annual physical exam   [place] [facility] Larae Grooms, NP   1 year ago Type 2 diabetes mellitus with diabetic neuropathy, with long-term current use of insulin (HCC)   [place] [facility] Vigg, Avanti, MD   1 year ago Type 2 diabetes mellitus with diabetic neuropathy, with long-term current use of insulin (HCC)   [place] [facility] Loura Pardon, MD

## 2022-10-22 ENCOUNTER — Telehealth: Payer: Self-pay | Admitting: Nurse Practitioner

## 2022-10-22 NOTE — Telephone Encounter (Signed)
Pt is calling in because he needs a PA for dapagliflozin propanediol (FARXIGA) 10 MG TABS tablet [[id number]] as well as OZEMPIC, 0.25 OR 0.5 MG/DOSE, 2 MG/3ML SOPN [[id number]] . Pt says this has been going on for a month and there has been nothing sent. Pharmacist says they have sent over a request for a PA through Cover My Meds.

## 2022-10-23 NOTE — Telephone Encounter (Signed)
Called and notified patient that prior auth's for both medications have been faxed in. Advised patient that we would let him know something as soon as we receive determinations.

## 2022-10-23 NOTE — Telephone Encounter (Signed)
Tried contacting [employer] [employer] for both prior authorizations. I was informed that forms needed to be completed for these requests. Representative with [place] [employer] assisted me with finding the forms.   Forms have been completed and faxed in to the insurance. Awaiting determination from them   Will call and update the patient on the status.

## 2022-10-27 ENCOUNTER — Ambulatory Visit: Payer: Medicaid Other | Admitting: Podiatry

## 2022-10-27 ENCOUNTER — Telehealth: Payer: Self-pay | Admitting: Nurse Practitioner

## 2022-10-27 ENCOUNTER — Other Ambulatory Visit: Payer: Self-pay | Admitting: Nurse Practitioner

## 2022-10-27 NOTE — Telephone Encounter (Signed)
error 

## 2022-10-28 NOTE — Telephone Encounter (Signed)
Requested Prescriptions  Refused Prescriptions Disp Refills   OZEMPIC, 0.25 OR 0.5 MG/DOSE, 2 MG/3ML SOPN [Pharmacy Med Name: OZEMPIC (0.25 OR 0.5 MG/DOSE) 2 MG/] 3 mL 0    Sig: INJECT 0.5MG  SUBCUTANEOUSLY ONCE A WEEK     Endocrinology:  Diabetes - GLP-1 Receptor Agonists - semaglutide Failed - 10/27/2022 10:14 AM      Failed - HBA1C in normal range and within 180 days    HB A1C (BAYER DCA - WAIVED)  Date Value Ref Range Status  10/08/2021 5.2 4.8 - 5.6 % Final    Comment:             Prediabetes: 5.7 - 6.4          Diabetes: >6.4          Glycemic control for adults with diabetes: <7.0    Hgb A1c MFr Bld  Date Value Ref Range Status  10/01/2022 6.0 (H) 4.8 - 5.6 % Final    Comment:             Prediabetes: 5.7 - 6.4          Diabetes: >6.4          Glycemic control for adults with diabetes: <7.0          Passed - Cr in normal range and within 360 days    Creatinine, Ser  Date Value Ref Range Status  10/01/2022 0.99 0.76 - 1.27 mg/dL Final         Passed - Valid encounter within last 6 months    Recent Outpatient Visits           3 weeks ago Primary hypertension   [place] [facility] Larae Grooms, NP   3 months ago Primary hypertension   [place] [facility] Lar[facility], [facility]   6 months ago Annual physical exam   [place] [facility] Larae G[facility], NP   1 year ago Type 2 diabetes mellitus with diabetic neuropathy, with long-term current use of insulin (HCC)   [place] [facility] Vigg, Avanti, MD   1 year ago Type 2 diabetes mellitus with diabetic neuropathy, with long-term current use of insulin (HCC)   [place] [facility] Loura Pardon, MD

## 2022-11-04 NOTE — Telephone Encounter (Signed)
Contacted [place] [employer] as nothing has been received as far as an approval or denial. [place] [employer] state that both medications have been approved.   Contacted patient and he states that he has already picked up both medications.

## 2022-11-13 ENCOUNTER — Other Ambulatory Visit: Payer: Medicaid Other

## 2022-11-13 ENCOUNTER — Ambulatory Visit: Payer: Medicaid Other | Admitting: Podiatry

## 2022-11-24 LAB — HM DIABETES EYE EXAM

## 2022-11-26 ENCOUNTER — Other Ambulatory Visit: Payer: Self-pay | Admitting: Nurse Practitioner

## 2022-11-26 ENCOUNTER — Encounter: Payer: Self-pay | Admitting: Nurse Practitioner

## 2022-11-27 NOTE — Telephone Encounter (Signed)
Requested Prescriptions  Pending Prescriptions Disp Refills   OZEMPIC, 0.25 OR 0.5 MG/DOSE, 2 MG/3ML SOPN [Pharmacy Med Name: OZEMPIC (0.25 OR 0.5 MG/DOSE) 2 MG/] 3 mL 0    Sig: INJECT 0.5MG  SUBCUTANEOUSLY ONCE A WEEK     Endocrinology:  Diabetes - GLP-1 Receptor Agonists - semaglutide Failed - 11/26/2022  3:25 PM      Failed - HBA1C in normal range and within 180 days    HB A1C (BAYER DCA - WAIVED)  Date Value Ref Range Status  10/08/2021 5.2 4.8 - 5.6 % Final    Comment:             Prediabetes: 5.7 - 6.4          Diabetes: >6.4          Glycemic control for adults with diabetes: <7.0    Hgb A1c MFr Bld  Date Value Ref Range Status  10/01/2022 6.0 (H) 4.8 - 5.6 % Final    Comment:             Prediabetes: 5.7 - 6.4          Diabetes: >6.4          Glycemic control for adults with diabetes: <7.0          Passed - Cr in normal range and within 360 days    Creatinine, Ser  Date Value Ref Range Status  10/01/2022 0.99 0.76 - 1.27 mg/dL Final         Passed - Valid encounter within last 6 months    Recent Outpatient Visits           1 month ago Primary hypertension   [place] [facility] Larae Grooms, NP   4 months ago Primary hypertension   [place] [facility] Lar[facility] G[facility], NP   7 months ago Annual physical exam   [place] [facility] G[facility], NP   1 year ago Type 2 diabetes mellitus with diabetic neuropathy, with long-term current use of insulin (HCC)   [place] [facility] Vigg, Avanti, MD   1 year ago Type 2 diabetes mellitus with diabetic neuropathy, with long-term current use of insulin (HCC)   [place] [facility] Loura Pardon, MD

## 2022-12-05 ENCOUNTER — Ambulatory Visit: Payer: Medicaid Other | Admitting: Podiatry

## 2022-12-05 VITALS — BP 155/99

## 2022-12-05 DIAGNOSIS — M79674 Pain in right toe(s): Secondary | ICD-10-CM

## 2022-12-05 DIAGNOSIS — M79675 Pain in left toe(s): Secondary | ICD-10-CM

## 2022-12-05 DIAGNOSIS — E1140 Type 2 diabetes mellitus with diabetic neuropathy, unspecified: Secondary | ICD-10-CM

## 2022-12-05 DIAGNOSIS — B351 Tinea unguium: Secondary | ICD-10-CM | POA: Diagnosis not present

## 2022-12-05 DIAGNOSIS — Z794 Long term (current) use of insulin: Secondary | ICD-10-CM | POA: Diagnosis not present

## 2022-12-05 NOTE — Progress Notes (Unsigned)
  Subjective:  Patient ID: Lance Vaughan, male    DOB: [birth year],  MRN: [id number]  Lance Vaughan presents to clinic today for {jgcomplaint:23593} Chief Complaint  Patient presents with   Nail Problem    DFC,Referring Provider Larae Grooms, NP,LOV:04/24,A1C:6.0,BS:not today       New problem(s): None. {jgcomplaint:23593}  PCP is Larae Grooms, NP.  Allergies  Allergen Reactions   Lisinopril Other (See Comments)    Chest pain    Review of Systems: Negative except as noted in the HPI. Objective:   Vitals:   12/05/22 0943 12/05/22 0944  BP: (!) 167/88 (!) 155/99   Constitutional Lance Vaughan is a pleasant 57 y.o. male, {jgbodyhabitus:24098} AAO x 3.   Vascular Vascular Examination: Capillary refill time immediate b/l. Vascular status intact b/l with palpable pedal pulses. Pedal hair present ***. No edema. No pain with calf compression b/l. Skin temperature gradient WNL b/l. No cyanosis or clubbing b/l. {jgvascular:23595}  Neurological Examination: Sensation grossly intact b/l with 10 gram monofilament. Vibratory sensation intact b/l. {jgneuro:23601::"Protective sensation intact 5/5 intact bilaterally with 10g monofilament b/l.","Vibratory sensation intact b/l.","Proprioception intact bilaterally."}  Dermatological Examination: Pedal skin with normal turgor, texture and tone b/l.  No open wounds. No interdigital macerations.   Toenails 1-5 b/l thick, discolored, elongated with subungual debris and pain on dorsal palpation.   {jgderm:23598}  Musculoskeletal Examination: {jgmsk:23600}  Radiographs: None  Last A1c:      Latest Ref Rng & Units 10/01/2022   11:02 AM 04/15/2022    1:27 PM  Hemoglobin A1C  Hemoglobin-A1c 4.8 - 5.6 % 6.0  6.2        Assessment:   1. Pain due to onychomycosis of toenails of both feet    Plan:  Patient was evaluated and treated and all questions answered. Consent given for treatment as described  below: {jgplan:23602::"-Patient/POA to call should there be question/concern in the interim."}  Return in about 3 months (around 03/07/2023).  Freddie Breech, DPM

## 2022-12-08 ENCOUNTER — Encounter: Payer: Self-pay | Admitting: Podiatry

## 2023-01-09 ENCOUNTER — Telehealth: Payer: Self-pay | Admitting: Nurse Practitioner

## 2023-01-09 ENCOUNTER — Other Ambulatory Visit: Payer: Self-pay | Admitting: Nurse Practitioner

## 2023-01-09 NOTE — Telephone Encounter (Signed)
Requested Prescriptions  Pending Prescriptions Disp Refills   OZEMPIC, 0.25 OR 0.5 MG/DOSE, 2 MG/3ML SOPN [Pharmacy Med Name: OZEMPIC (0.25 OR 0.5 MG/DOSE) 2 MG/] 3 mL 0    Sig: INJECT 0.5MG  SUBCUTANEOUSLY ONCE A WEEK     Endocrinology:  Diabetes - GLP-1 Receptor Agonists - semaglutide Failed - 01/09/2023 12:12 PM      Failed - HBA1C in normal range and within 180 days    HB A1C (BAYER DCA - WAIVED)  Date Value Ref Range Status  10/08/2021 5.2 4.8 - 5.6 % Final    Comment:             Prediabetes: 5.7 - 6.4          Diabetes: >6.4          Glycemic control for adults with diabetes: <7.0    Hgb A1c MFr Bld  Date Value Ref Range Status  10/01/2022 6.0 (H) 4.8 - 5.6 % Final    Comment:             Prediabetes: 5.7 - 6.4          Diabetes: >6.4          Glycemic control for adults with diabetes: <7.0          Passed - Cr in normal range and within 360 days    Creatinine, Ser  Date Value Ref Range Status  10/01/2022 0.99 0.76 - 1.27 mg/dL Final         Passed - Valid encounter within last 6 months    Recent Outpatient Visits           3 months ago Primary hypertension   [place] [facility] Larae Grooms, NP   5 months ago Primary hypertension   [place] [facility] Larae Grooms, NP   8 months ago Annual physical exam   [place] [facility] G[facility], NP   1 year ago Type 2 diabetes mellitus with diabetic neuropathy, with long-term current use of insulin (HCC)   [place] [facility] Vigg, Avanti, MD   1 year ago Type 2 diabetes mellitus with diabetic neuropathy, with long-term current use of insulin (HCC)   [place] [facility] Loura Pardon, MD

## 2023-01-09 NOTE — Telephone Encounter (Signed)
The patient called in inquiring about his refill request for OZEMPIC, 0.25 OR 0.5 MG/DOSE, 2 MG/3ML SOPN . He states his last injection was a week ago and he needs this ASAP. He is frustrated he has to call this in every month saying this should be automatic as he has been taking it a long time and needs it. Please assist patient further as he is hoping and needing it today he says. He uses  [facility] - [place], [place] - [address] Phone: [phone]  Fax: [phone]

## 2023-01-09 NOTE — Telephone Encounter (Signed)
Already sent in today in a separate refill encounter from the pharmacy 01/09/23.

## 2023-01-20 ENCOUNTER — Encounter: Payer: Self-pay | Admitting: *Deleted

## 2023-01-25 ENCOUNTER — Inpatient Hospital Stay
Admission: EM | Admit: 2023-01-25 | Discharge: 2023-01-27 | DRG: 398 | Disposition: A | Discharge status: Home / Self Care | Payer: MEDICAID | Attending: Internal Medicine | Admitting: Internal Medicine

## 2023-01-25 ENCOUNTER — Encounter: Payer: Self-pay | Admitting: Intensive Care

## 2023-01-25 ENCOUNTER — Other Ambulatory Visit: Payer: Self-pay

## 2023-01-25 ENCOUNTER — Emergency Department: Payer: MEDICAID

## 2023-01-25 DIAGNOSIS — Z5941 Food insecurity: Secondary | ICD-10-CM

## 2023-01-25 DIAGNOSIS — Z833 Family history of diabetes mellitus: Secondary | ICD-10-CM

## 2023-01-25 DIAGNOSIS — K3580 Unspecified acute appendicitis: Secondary | ICD-10-CM | POA: Diagnosis present

## 2023-01-25 DIAGNOSIS — Z7982 Long term (current) use of aspirin: Secondary | ICD-10-CM | POA: Diagnosis not present

## 2023-01-25 DIAGNOSIS — E785 Hyperlipidemia, unspecified: Secondary | ICD-10-CM | POA: Diagnosis present

## 2023-01-25 DIAGNOSIS — N179 Acute kidney failure, unspecified: Secondary | ICD-10-CM | POA: Diagnosis not present

## 2023-01-25 DIAGNOSIS — I10 Essential (primary) hypertension: Secondary | ICD-10-CM | POA: Diagnosis present

## 2023-01-25 DIAGNOSIS — F319 Bipolar disorder, unspecified: Secondary | ICD-10-CM | POA: Diagnosis present

## 2023-01-25 DIAGNOSIS — E669 Obesity, unspecified: Secondary | ICD-10-CM | POA: Diagnosis present

## 2023-01-25 DIAGNOSIS — K37 Unspecified appendicitis: Secondary | ICD-10-CM | POA: Diagnosis not present

## 2023-01-25 DIAGNOSIS — Z83438 Family history of other disorder of lipoprotein metabolism and other lipidemia: Secondary | ICD-10-CM

## 2023-01-25 DIAGNOSIS — Z6832 Body mass index (BMI) 32.0-32.9, adult: Secondary | ICD-10-CM | POA: Diagnosis not present

## 2023-01-25 DIAGNOSIS — K5730 Diverticulosis of large intestine without perforation or abscess without bleeding: Secondary | ICD-10-CM | POA: Diagnosis present

## 2023-01-25 DIAGNOSIS — R1030 Lower abdominal pain, unspecified: Secondary | ICD-10-CM

## 2023-01-25 DIAGNOSIS — K353 Acute appendicitis with localized peritonitis, without perforation or gangrene: Secondary | ICD-10-CM | POA: Diagnosis not present

## 2023-01-25 DIAGNOSIS — Z7985 Long-term (current) use of injectable non-insulin antidiabetic drugs: Secondary | ICD-10-CM | POA: Diagnosis not present

## 2023-01-25 DIAGNOSIS — Z8249 Family history of ischemic heart disease and other diseases of the circulatory system: Secondary | ICD-10-CM

## 2023-01-25 DIAGNOSIS — E1140 Type 2 diabetes mellitus with diabetic neuropathy, unspecified: Secondary | ICD-10-CM | POA: Diagnosis present

## 2023-01-25 DIAGNOSIS — Z888 Allergy status to other drugs, medicaments and biological substances status: Secondary | ICD-10-CM

## 2023-01-25 DIAGNOSIS — K3533 Acute appendicitis with perforation and localized peritonitis, with abscess: Principal | ICD-10-CM | POA: Diagnosis present

## 2023-01-25 DIAGNOSIS — Z79899 Other long term (current) drug therapy: Secondary | ICD-10-CM

## 2023-01-25 DIAGNOSIS — F17210 Nicotine dependence, cigarettes, uncomplicated: Secondary | ICD-10-CM | POA: Diagnosis present

## 2023-01-25 DIAGNOSIS — Z794 Long term (current) use of insulin: Secondary | ICD-10-CM | POA: Diagnosis not present

## 2023-01-25 DIAGNOSIS — Z5986 Financial insecurity: Secondary | ICD-10-CM | POA: Diagnosis not present

## 2023-01-25 HISTORY — DX: Unspecified acute appendicitis: K35.80

## 2023-01-25 LAB — COMPREHENSIVE METABOLIC PANEL
ALT: 40 U/L (ref 0–44)
AST: 32 U/L (ref 15–41)
Albumin: 4.3 g/dL (ref 3.5–5.0)
Alkaline Phosphatase: 65 U/L (ref 38–126)
Anion gap: 10 (ref 5–15)
BUN: 11 mg/dL (ref 6–20)
CO2: 25 mmol/L (ref 22–32)
Calcium: 9 mg/dL (ref 8.9–10.3)
Chloride: 104 mmol/L (ref 98–111)
Creatinine, Ser: 1.08 mg/dL (ref 0.61–1.24)
GFR, Estimated: >60 mL/min (ref >60)
Glucose, Bld: 132 mg/dL — ABNORMAL HIGH (ref 70–99)
Potassium: 3.8 mmol/L (ref 3.5–5.1)
Sodium: 139 mmol/L (ref 135–145)
Total Bilirubin: 0.4 mg/dL (ref 0.3–1.2)
Total Protein: 8 g/dL (ref 6.5–8.1)

## 2023-01-25 LAB — URINALYSIS, ROUTINE W REFLEX MICROSCOPIC
Bilirubin Urine: NEGATIVE
Glucose, UA: NEGATIVE mg/dL
Hgb urine dipstick: NEGATIVE
Ketones, ur: NEGATIVE mg/dL
Leukocytes,Ua: NEGATIVE
Nitrite: NEGATIVE
Protein, ur: NEGATIVE mg/dL
Specific Gravity, Urine: >1.046 — ABNORMAL HIGH (ref 1.005–1.030)
pH: 6 (ref 5.0–8.0)

## 2023-01-25 LAB — CBC
HCT: 47.3 % (ref 39.0–52.0)
Hemoglobin: 16 g/dL (ref 13.0–17.0)
MCH: 32.5 pg (ref 26.0–34.0)
MCHC: 33.8 g/dL (ref 30.0–36.0)
MCV: 95.9 fL (ref 80.0–100.0)
Platelets: 277 10*3/uL (ref 150–400)
RBC: 4.93 MIL/uL (ref 4.22–5.81)
RDW: 13.2 % (ref 11.5–15.5)
WBC: 6.7 10*3/uL (ref 4.0–10.5)
nRBC: 0 % (ref 0.0–0.2)

## 2023-01-25 LAB — LIPASE, BLOOD: Lipase: 29 U/L (ref 11–51)

## 2023-01-25 MED ORDER — METOPROLOL SUCCINATE ER 25 MG PO TB24
25.0000 mg | ORAL_TABLET | Freq: Every day | ORAL | Status: DC
  Administered 2023-01-26 – 2023-01-27 (×3): 25 mg via ORAL
  Filled 2023-01-25 (×3): qty 1

## 2023-01-25 MED ORDER — OXYCODONE HCL 5 MG PO TABS
5.0000 mg | ORAL_TABLET | ORAL | Status: DC | PRN
  Administered 2023-01-26 – 2023-01-27 (×4): 10 mg via ORAL
  Administered 2023-01-27: 5 mg via ORAL
  Filled 2023-01-25 (×2): qty 2
  Filled 2023-01-25: qty 1
  Filled 2023-01-25 (×3): qty 2

## 2023-01-25 MED ORDER — KETOROLAC TROMETHAMINE 30 MG/ML IJ SOLN
30.0000 mg | Freq: Four times a day (QID) | INTRAMUSCULAR | Status: DC | PRN
  Administered 2023-01-25 – 2023-01-27 (×3): 30 mg via INTRAVENOUS
  Filled 2023-01-25 (×4): qty 1

## 2023-01-25 MED ORDER — FENTANYL CITRATE PF 50 MCG/ML IJ SOSY
50.0000 ug | PREFILLED_SYRINGE | Freq: Once | INTRAMUSCULAR | Status: AC
  Administered 2023-01-25: 50 ug via INTRAVENOUS
  Filled 2023-01-25: qty 1

## 2023-01-25 MED ORDER — SODIUM CHLORIDE 0.9 % IV BOLUS
1000.0000 mL | Freq: Once | INTRAVENOUS | Status: AC
  Administered 2023-01-25: 1000 mL via INTRAVENOUS

## 2023-01-25 MED ORDER — PANTOPRAZOLE SODIUM 40 MG IV SOLR
40.0000 mg | Freq: Every day | INTRAVENOUS | Status: DC
  Administered 2023-01-25 – 2023-01-26 (×2): 40 mg via INTRAVENOUS
  Filled 2023-01-25 (×2): qty 10

## 2023-01-25 MED ORDER — DIPHENHYDRAMINE HCL 12.5 MG/5ML PO ELIX: 12.5000 mg | ORAL_SOLUTION | Freq: Four times a day (QID) | ORAL | Status: DC | PRN

## 2023-01-25 MED ORDER — ONDANSETRON 4 MG PO TBDP
4.0000 mg | ORAL_TABLET | Freq: Once | ORAL | Status: AC | PRN
  Administered 2023-01-25: 4 mg via ORAL
  Filled 2023-01-25: qty 1

## 2023-01-25 MED ORDER — OXYCODONE-ACETAMINOPHEN 5-325 MG PO TABS
1.0000 | ORAL_TABLET | ORAL | Status: DC | PRN
  Administered 2023-01-25: 1 via ORAL
  Filled 2023-01-25: qty 1

## 2023-01-25 MED ORDER — INSULIN ASPART 100 UNIT/ML IJ SOLN: 0.0000 [IU] | Freq: Every day | INTRAMUSCULAR | Status: DC

## 2023-01-25 MED ORDER — HYDRALAZINE HCL 20 MG/ML IJ SOLN: 10.0000 mg | INTRAMUSCULAR | Status: DC | PRN

## 2023-01-25 MED ORDER — MELATONIN 5 MG PO TABS: 2.5000 mg | ORAL_TABLET | Freq: Every evening | ORAL | Status: DC | PRN

## 2023-01-25 MED ORDER — INSULIN ASPART 100 UNIT/ML IJ SOLN
0.0000 [IU] | Freq: Three times a day (TID) | INTRAMUSCULAR | Status: DC
  Administered 2023-01-26 (×2): 3 [IU] via SUBCUTANEOUS
  Filled 2023-01-25 (×2): qty 1

## 2023-01-25 MED ORDER — SODIUM CHLORIDE 0.9 % IV SOLN: INTRAVENOUS | Status: DC

## 2023-01-25 MED ORDER — PROCHLORPERAZINE EDISYLATE 10 MG/2ML IJ SOLN: 5.0000 mg | Freq: Four times a day (QID) | INTRAMUSCULAR | Status: DC | PRN

## 2023-01-25 MED ORDER — MORPHINE SULFATE (PF) 2 MG/ML IV SOLN
2.0000 mg | INTRAVENOUS | Status: DC | PRN
  Administered 2023-01-26: 2 mg via INTRAVENOUS
  Filled 2023-01-25: qty 1

## 2023-01-25 MED ORDER — ONDANSETRON 4 MG PO TBDP: 4.0000 mg | ORAL_TABLET | Freq: Four times a day (QID) | ORAL | Status: DC | PRN

## 2023-01-25 MED ORDER — PIPERACILLIN-TAZOBACTAM 3.375 G IVPB 30 MIN
3.3750 g | Freq: Once | INTRAVENOUS | Status: AC
  Administered 2023-01-25: 3.375 g via INTRAVENOUS
  Filled 2023-01-25: qty 50

## 2023-01-25 MED ORDER — MORPHINE SULFATE (PF) 4 MG/ML IV SOLN
4.0000 mg | Freq: Once | INTRAVENOUS | Status: AC
  Administered 2023-01-25: 4 mg via INTRAVENOUS
  Filled 2023-01-25: qty 1

## 2023-01-25 MED ORDER — DIPHENHYDRAMINE HCL 50 MG/ML IJ SOLN: 12.5000 mg | Freq: Four times a day (QID) | INTRAMUSCULAR | Status: DC | PRN

## 2023-01-25 MED ORDER — PIPERACILLIN-TAZOBACTAM 3.375 G IVPB
3.3750 g | Freq: Three times a day (TID) | INTRAVENOUS | Status: DC
  Administered 2023-01-26 – 2023-01-27 (×5): 3.375 g via INTRAVENOUS
  Filled 2023-01-25 (×5): qty 50

## 2023-01-25 MED ORDER — PROCHLORPERAZINE MALEATE 10 MG PO TABS: 10.0000 mg | ORAL_TABLET | Freq: Four times a day (QID) | ORAL | Status: DC | PRN

## 2023-01-25 MED ORDER — ALBUTEROL SULFATE (2.5 MG/3ML) 0.083% IN NEBU: 2.5000 mg | INHALATION_SOLUTION | Freq: Four times a day (QID) | RESPIRATORY_TRACT | Status: DC | PRN

## 2023-01-25 MED ORDER — IOHEXOL 350 MG/ML SOLN
100.0000 mL | Freq: Once | INTRAVENOUS | Status: AC | PRN
  Administered 2023-01-25: 100 mL via INTRAVENOUS

## 2023-01-25 MED ORDER — INSULIN ASPART 100 UNIT/ML IJ SOLN
4.0000 [IU] | Freq: Three times a day (TID) | INTRAMUSCULAR | Status: DC
  Administered 2023-01-26 – 2023-01-27 (×3): 4 [IU] via SUBCUTANEOUS
  Filled 2023-01-25 (×3): qty 1

## 2023-01-25 MED ORDER — ONDANSETRON HCL 4 MG/2ML IJ SOLN: 4.0000 mg | Freq: Four times a day (QID) | INTRAMUSCULAR | Status: DC | PRN

## 2023-01-25 MED ORDER — ACETAMINOPHEN 500 MG PO TABS
1000.0000 mg | ORAL_TABLET | Freq: Four times a day (QID) | ORAL | Status: DC
  Administered 2023-01-25 – 2023-01-27 (×5): 1000 mg via ORAL
  Filled 2023-01-25 (×6): qty 2

## 2023-01-25 MED ORDER — HEPARIN SODIUM (PORCINE) 5000 UNIT/ML IJ SOLN
5000.0000 [IU] | Freq: Three times a day (TID) | INTRAMUSCULAR | Status: DC
  Administered 2023-01-26 – 2023-01-27 (×3): 5000 [IU] via SUBCUTANEOUS
  Filled 2023-01-25 (×3): qty 1

## 2023-01-25 NOTE — ED Notes (Signed)
Patient in recliner in subwait demanding a blanket. Blanket given to patient

## 2023-01-25 NOTE — ED Provider Notes (Signed)
[facility] Emergency Department Provider Note     Event Date/Time   First MD Initiated Contact with Patient 01/25/23 1633     (approximate)   History   Abdominal Pain   HPI  Lance Vaughan is a 57 y.o. male with a history of HLD, HTN, DM, bipolar disorder, obesity, and diverticulosis, presents to the ED for evaluation of acute lower abdominal pain.  Patient reported onset approximately 3 to 4 hours prior to arrival.  Denies any associated nausea, vomiting, diarrhea.  He would endorse anorexia today noting his last solid food intake was yesterday.  No sick contacts, recent travel, fevers reported.  Patient also denies any hematemesis, melanotic stools, or hematochezia.    Physical Exam   Triage Vital Signs: ED Triage Vitals [01/25/23 1527]  Encounter Vitals Group     BP (!) 183/109     Systolic BP Percentile      Diastolic BP Percentile      Pulse Rate 63     Resp 20     Temp 98.2 F (36.8 C)     Temp Source Oral     SpO2 100 %     Weight 230 lb (104.3 kg)     Height 6' (1.829 m)     Head Circumference      Peak Flow      Pain Score 10     Pain Loc      Pain Education      Exclude from Growth Chart     Most recent vital signs: Vitals:   01/25/23 1527  BP: (!) 183/109  Pulse: 63  Resp: 20  Temp: 98.2 F (36.8 C)  SpO2: 100%    General Awake, no distress. NAD HEENT NCAT. PERRL. EOMI. No rhinorrhea. Mucous membranes are moist.  CV:  Good peripheral perfusion. RRR RESP:  Normal effort. CTA ABD:  No distention.  Soft and tender to palpation over the lower abdominal quadrants.  Normal bowel sounds noted.  No rebound, guarding, or rigidity elicited.   ED Results / Procedures / Treatments   Labs (all labs ordered are listed, but only abnormal results are displayed) Labs Reviewed  COMPREHENSIVE METABOLIC PANEL - Abnormal; Notable for the following components:      Result Value   Glucose, Bld 132 (*)    All other components within  normal limits  URINALYSIS, ROUTINE W REFLEX MICROSCOPIC - Abnormal; Notable for the following components:   Color, Urine STRAW (*)    APPearance CLEAR (*)    Specific Gravity, Urine >1.046 (*)    All other components within normal limits  LIPASE, BLOOD  CBC    EKG   RADIOLOGY  I personally viewed and evaluated these images as part of my medical decision making, as well as reviewing the written report by the radiologist.  ED Provider Interpretation: acute appendicitis with appendicolith w/o abscess  CT ABDOMEN PELVIS W CONTRAST  Result Date: 01/25/2023 CLINICAL DATA:  Acute abdominal pain, concern for diverticulitis. EXAM: CT ABDOMEN AND PELVIS WITH CONTRAST TECHNIQUE: Multidetector CT imaging of the abdomen and pelvis was performed using the standard protocol following bolus administration of intravenous contrast. RADIATION DOSE REDUCTION: This exam was performed according to the departmental dose-optimization program which includes automated exposure control, adjustment of the mA and/or kV according to patient size and/or use of iterative reconstruction technique. CONTRAST:  OMNIPAQUE IOHEXOL 350 MG/ML SOLN COMPARISON:  CT abdomen and pelvis dated 03/03/2016. FINDINGS: Lower chest: There is mild  bibasilar atelectasis. Hepatobiliary: No focal liver abnormality is seen. No gallstones, gallbladder wall thickening, or biliary dilatation. Pancreas: Unremarkable. No pancreatic ductal dilatation or surrounding inflammatory changes. Spleen: Normal in size without focal abnormality. Adrenals/Urinary Tract: Adrenal glands are unremarkable. Kidneys are normal, without renal calculi, focal lesion, or hydronephrosis. Bladder is unremarkable. Stomach/Bowel: Stomach is within normal limits. There is diverticulosis of the descending and sigmoid colon with mild bowel wall thickening of the distal descending and proximal sigmoid colon. No significant associated inflammatory changes. There is an  appendicolith at the base of the appendix with dilation of the appendix to 11 mm in diameter and moderate surrounding inflammatory changes. No evidence of abscess formation. No evidence of bowel obstruction. Vascular/Lymphatic: Aortic atherosclerosis. No enlarged abdominal or pelvic lymph nodes. Reproductive: The prostate is unremarkable. Bilateral hydroceles are noted. Other: There is moderate volume free intraperitoneal fluid. No abdominal wall hernia is identified. Musculoskeletal: Degenerative changes are seen in the spine. IMPRESSION: 1. Acute appendicitis.  No evidence of abscess. 2. Bowel wall thickening of the distal descending and proximal sigmoid colon in an area of diverticulosis is indeterminate, but is favored to reflect chronic changes of diverticulosis as opposed to acute diverticulitis. Aortic Atherosclerosis (ICD10-I70.0). Electronically Signed   By: Romona Curls M.D.   On: 01/25/2023 18:41     PROCEDURES:  Critical Care performed: No  Procedures   MEDICATIONS ORDERED IN ED: Medications  oxyCODONE-acetaminophen (PERCOCET/ROXICET) 5-325 MG per tablet 1 tablet (1 tablet Oral Given 01/25/23 1531)  piperacillin-tazobactam (ZOSYN) IVPB 3.375 g (has no administration in time range)  sodium chloride 0.9 % bolus 1,000 mL (has no administration in time range)  morphine (PF) 4 MG/ML injection 4 mg (has no administration in time range)  ondansetron (ZOFRAN-ODT) disintegrating tablet 4 mg (4 mg Oral Given 01/25/23 1531)  fentaNYL (SUBLIMAZE) injection 50 mcg (50 mcg Intravenous Given 01/25/23 1834)  iohexol (OMNIPAQUE) 350 MG/ML injection 100 mL (100 mLs Intravenous Contrast Given 01/25/23 1816)     IMPRESSION / MDM / ASSESSMENT AND PLAN / ED COURSE  I reviewed the triage vital signs and the nursing notes.                              Differential diagnosis includes, but is not limited to, acute appendicitis, renal colic, testicular torsion, urinary tract infection/pyelonephritis,  prostatitis,  epididymitis, diverticulitis, small bowel obstruction or ileus, colitis, abdominal aortic aneurysm, gastroenteritis, hernia, etc.  Patient's presentation is most consistent with acute complicated illness / injury requiring diagnostic workup.  ----------------------------------------- 7:37 PM on 01/25/2023 ----------------------------------------- S/W Dr. Everlene Farrier: He suggested to the patient empirically on Zosyn and have the patient n.p.o. after midnight for presumed surgical intervention tomorrow morning.  Patient's diagnosis is consistent with appendicitis secondary to appendicolith.  Patient to the ED for sudden onset of lower abdominal discomfort today with anorexia and nausea/vomiting, and diarrhea.  Patient presented assuming he had a diverticulitis flare.  Patient with reassuring labs at this time without evidence of acute leukocytosis or critical anemia.  No electrolyte abnormalities noted.  Patient is afebrile on presentation and required p.o. pain medicines in triage. Patient has been updated on his current diagnoses and need for surgical intervention.  He is agreeable to the plan of admission and surgical management in the morning.  Patient will be n.p.o. after midnight.    FINAL CLINICAL IMPRESSION(S) / ED DIAGNOSES   Final diagnoses:  Lower abdominal pain  Acute appendicitis with localized peritonitis,  without perforation, abscess, or gangrene     Rx / DC Orders   ED Discharge Orders     None        Note:  This document was prepared using Dragon voice recognition software and may include unintentional dictation errors.    Lissa Hoard, PA-C 01/25/23 1940    Merwyn Katos, MD 01/25/23 [phone]

## 2023-01-25 NOTE — Assessment & Plan Note (Signed)
S/p appendectomy and a drain placement.  Found to have severe appendicitis and an abscess with contained perforation. -Continue with Zosyn-if remains stable can be discharged tomorrow on Cipro and Flagyl for total of 10 days. -Continue with supportive care

## 2023-01-25 NOTE — Assessment & Plan Note (Signed)
Continue hydralazine, metoprolol and amlodipine Hydralazine IV as needed while n.p.o.

## 2023-01-25 NOTE — ED Triage Notes (Signed)
Patient c/o diverticulitis. Reports flare up started a few hours ago.   Reports N/V/D

## 2023-01-25 NOTE — Assessment & Plan Note (Signed)
Take 

## 2023-01-25 NOTE — H&P (Signed)
History and Physical    Patient: Lance Vaughan RUE:[id number] DOB: [birth year] DOA: 01/25/2023 DOS: the patient was seen and examined on 01/25/2023 PCP: Larae Grooms, NP  Patient coming from: Home  Chief Complaint:  Chief Complaint  Patient presents with   Abdominal Pain    HPI: Lance Vaughan is a 57 y.o. male with medical history significant for DM, HTN, diverticulitis in the past who presents to the ED with a 1 day history of right lower quadrant pain associated with decreased oral intake.  He denies nausea or vomiting, diarrhea or dysuria, fever or chills. ED course and data review: BP initially 183/109 with otherwise normal vitals.  CBC and CMP unremarkable, lipase and LFTs within normal limits and urinalysis unremarkable.CT the abdomen and pelvis showing acute appendicitis as follows: IMPRESSION: 1. Acute appendicitis.  No evidence of abscess. 2. Bowel wall thickening of the distal descending and proximal sigmoid colon in an area of diverticulosis is indeterminate, but is favored to reflect chronic changes of diverticulosis as opposed to acute diverticulitis.  The ED provider spoke with surgeon, Dr. Everlene Farrier who recommended IV Zosyn and admitted to medical service, keeping n.p.o. for surgery in the a.m.  Patient treated with morphine, IV fluids, started on [facility] consulted for admission.   Review of Systems: As mentioned in the history of present illness. All other systems reviewed and are negative.  Past Medical History:  Diagnosis Date   Arthritis    Bipolar disorder (HCC)    Bursitis    Depression    Diabetes mellitus without complication (HCC)    Diverticulitis    Hyperlipidemia    Hypertension    Psychosis (HCC)    Psychosis (HCC)    Past Surgical History:  Procedure Laterality Date   COLONOSCOPY WITH PROPOFOL N/A 05/03/2018   Procedure: COLONOSCOPY WITH PROPOFOL;  Surgeon: Toney Reil, MD;  Location: [facility] ENDOSCOPY;  Service: Gastroenterology;   Laterality: N/A;   HEMORRHOID SURGERY N/A 07/06/2019   Procedure: HEMORRHOIDECTOMY;  Surgeon: [name], MD;  Location: [facility] ORS;  Service: General;  Laterality: N/A;   RECTAL EXAM UNDER ANESTHESIA N/A 07/06/2019   Procedure: RECTAL EXAM UNDER ANESTHESIA;  Surgeon: [name], MD;  Location: [facility] ORS;  Service: General;  Laterality: N/A;   Social History:  reports that he has been smoking cigarettes. He has a 60 pack-year smoking history. He has never used smokeless tobacco. He reports current alcohol use. He reports that he does not use drugs.  Allergies  Allergen Reactions   Lisinopril Other (See Comments)    Chest pain    Family History  Problem Relation Age of Onset   Hypertension Mother    Hypertension Sister    Hyperlipidemia Sister    Hyperlipidemia Sister    Hypertension Sister    Diabetes Maternal Grandmother     Prior to Admission medications   Medication Sig Start Date End Date Taking? Authorizing Provider  Accu-Chek Softclix Lancets lancets 1 each by Other route daily. Use as instructed 05/21/22   Larae Grooms, NP  albuterol (VENTOLIN HFA) 108 (90 Base) MCG/ACT inhaler Inhale 2 puffs into the lungs every 6 (six) hours as needed for wheezing or shortness of breath. Rescue inhaler 04/15/22   Larae Grooms, NP  amLODipine (NORVASC) 10 MG tablet TAKE 1 TABLET BY MOUTH ONCE DAILY 04/16/22   Aura Dials T, NP  aspirin EC 81 MG tablet Take 1 tablet (81 mg total) by mouth daily. 02/25/18   Tukov-Yual, Alroy Bailiff, NP  atorvastatin (LIPITOR) 40  MG tablet TAKE 1 TABLET BY MOUTH AT BEDTIME 07/24/22   Cannady, Jolene T, NP  Blood Glucose Monitoring Suppl (ACCU-CHEK AVIVA PLUS) w/Device KIT 1 each by Does not apply route daily. 05/21/22   Larae Grooms, NP  dapagliflozin propanediol (FARXIGA) 10 MG TABS tablet Take 1 tablet (10 mg total) by mouth daily. needs appointment for further refills 10/01/22   Larae Grooms, NP  gabapentin (NEURONTIN) 600 MG tablet  Take 1 tablet (600 mg total) by mouth 3 (three) times daily as needed. 04/15/22   Larae Grooms, NP  glucose blood (ACCU-CHEK AVIVA PLUS) test strip TEST TWICE DAILY 05/20/22   Larae Grooms, NP  ibuprofen (ADVIL) 800 MG tablet Take 1 tablet (800 mg total) by mouth every 8 (eight) hours as needed. 07/06/19   [name], MD  metoprolol succinate (TOPROL-XL) 25 MG 24 hr tablet TAKE 1 TABLET BY MOUTH ONCE DAILY 07/19/21   Johnson, Megan P, DO  OZEMPIC, 0.25 OR 0.5 MG/DOSE, 2 MG/3ML SOPN INJECT 0.5MG  SUBCUTANEOUSLY ONCE A WEEK 01/09/23   Larae Grooms, NP  polyethylene glycol powder (GLYCOLAX/MIRALAX) 17 GM/SCOOP powder Take 17 g by mouth 2 (two) times daily as needed. 07/24/21   Vigg, Avanti, MD  ULTRACARE PEN NEEDLES 32G X 4 MM MISC USE 3 TIMES DAILY AS NEEDED 07/30/20   Larae Grooms, NP  valsartan (DIOVAN) 80 MG tablet Take 1 tablet (80 mg total) by mouth daily. 07/23/22   Larae Grooms, NP    Physical Exam: Vitals:   01/25/23 1527 01/25/23 1950  BP: (!) 183/109 127/68  Pulse: 63 77  Resp: 20 20  Temp: 98.2 F (36.8 C) 99.5 F (37.5 C)  TempSrc: Oral Oral  SpO2: 100% 96%  Weight: 104.3 kg   Height: 6' (1.829 m)    Physical Exam Vitals and nursing note reviewed.  Constitutional:      General: He is not in acute distress. HENT:     Head: Normocephalic and atraumatic.  Cardiovascular:     Rate and Rhythm: Normal rate and regular rhythm.     Heart sounds: Normal heart sounds.  Pulmonary:     Effort: Pulmonary effort is normal.     Breath sounds: Normal breath sounds.  Abdominal:     Palpations: Abdomen is soft.     Tenderness: There is abdominal tenderness in the periumbilical area.  Neurological:     Mental Status: Mental status is at baseline.     Labs on Admission: I have personally reviewed following labs and imaging studies  CBC: Recent Labs  Lab 01/25/23 1529  WBC 6.7  HGB 16.0  HCT 47.3  MCV 95.9  PLT 277   Basic Metabolic Panel: Recent  Labs  Lab 01/25/23 1529  NA 139  K 3.8  CL 104  CO2 25  GLUCOSE 132*  BUN 11  CREATININE 1.08  CALCIUM 9.0   GFR: Estimated Creatinine Clearance: 94.3 mL/min (by C-G formula based on SCr of 1.08 mg/dL). Liver Function Tests: Recent Labs  Lab 01/25/23 1529  AST 32  ALT 40  ALKPHOS 65  BILITOT 0.4  PROT 8.0  ALBUMIN 4.3   Recent Labs  Lab 01/25/23 1529  LIPASE 29   No results for input(s): "AMMONIA" in the last 168 hours. Coagulation Profile: No results for input(s): "INR", "PROTIME" in the last 168 hours. Cardiac Enzymes: No results for input(s): "CKTOTAL", "CKMB", "CKMBINDEX", "TROPONINI" in the last 168 hours. BNP (last 3 results) No results for input(s): "PROBNP" in the last 8760 hours. HbA1C: No  results for input(s): "HGBA1C" in the last 72 hours. CBG: No results for input(s): "GLUCAP" in the last 168 hours. Lipid Profile: No results for input(s): "CHOL", "HDL", "LDLCALC", "TRIG", "CHOLHDL", "LDLDIRECT" in the last 72 hours. Thyroid Function Tests: No results for input(s): "TSH", "T4TOTAL", "FREET4", "T3FREE", "THYROIDAB" in the last 72 hours. Anemia Panel: No results for input(s): "VITAMINB12", "FOLATE", "FERRITIN", "TIBC", "IRON", "RETICCTPCT" in the last 72 hours. Urine analysis:    Component Value Date/Time   COLORURINE STRAW (A) 01/25/2023 1920   APPEARANCEUR CLEAR (A) 01/25/2023 1920   APPEARANCEUR Clear 04/15/2022 1325   LABSPEC >1.046 (H) 01/25/2023 1920   PHURINE 6.0 01/25/2023 1920   GLUCOSEU NEGATIVE 01/25/2023 1920   HGBUR NEGATIVE 01/25/2023 1920   BILIRUBINUR NEGATIVE 01/25/2023 1920   BILIRUBINUR Negative 04/15/2022 1325   KETONESUR NEGATIVE 01/25/2023 1920   PROTEINUR NEGATIVE 01/25/2023 1920   NITRITE NEGATIVE 01/25/2023 1920   LEUKOCYTESUR NEGATIVE 01/25/2023 1920    Radiological Exams on Admission: CT ABDOMEN PELVIS W CONTRAST  Result Date: 01/25/2023 CLINICAL DATA:  Acute abdominal pain, concern for diverticulitis. EXAM: CT  ABDOMEN AND PELVIS WITH CONTRAST TECHNIQUE: Multidetector CT imaging of the abdomen and pelvis was performed using the standard protocol following bolus administration of intravenous contrast. RADIATION DOSE REDUCTION: This exam was performed according to the departmental dose-optimization program which includes automated exposure control, adjustment of the mA and/or kV according to patient size and/or use of iterative reconstruction technique. CONTRAST:  OMNIPAQUE IOHEXOL 350 MG/ML SOLN COMPARISON:  CT abdomen and pelvis dated 03/03/2016. FINDINGS: Lower chest: There is mild bibasilar atelectasis. Hepatobiliary: No focal liver abnormality is seen. No gallstones, gallbladder wall thickening, or biliary dilatation. Pancreas: Unremarkable. No pancreatic ductal dilatation or surrounding inflammatory changes. Spleen: Normal in size without focal abnormality. Adrenals/Urinary Tract: Adrenal glands are unremarkable. Kidneys are normal, without renal calculi, focal lesion, or hydronephrosis. Bladder is unremarkable. Stomach/Bowel: Stomach is within normal limits. There is diverticulosis of the descending and sigmoid colon with mild bowel wall thickening of the distal descending and proximal sigmoid colon. No significant associated inflammatory changes. There is an appendicolith at the base of the appendix with dilation of the appendix to 11 mm in diameter and moderate surrounding inflammatory changes. No evidence of abscess formation. No evidence of bowel obstruction. Vascular/Lymphatic: Aortic atherosclerosis. No enlarged abdominal or pelvic lymph nodes. Reproductive: The prostate is unremarkable. Bilateral hydroceles are noted. Other: There is moderate volume free intraperitoneal fluid. No abdominal wall hernia is identified. Musculoskeletal: Degenerative changes are seen in the spine. IMPRESSION: 1. Acute appendicitis.  No evidence of abscess. 2. Bowel wall thickening of the distal descending and proximal sigmoid  colon in an area of diverticulosis is indeterminate, but is favored to reflect chronic changes of diverticulosis as opposed to acute diverticulitis. Aortic Atherosclerosis (ICD10-I70.0). Electronically Signed   By: Romona Curls M.D.   On: 01/25/2023 18:41     Data Reviewed: Relevant notes from primary care and specialist visits, past discharge summaries as available in EHR, including Care Everywhere. Prior diagnostic testing as pertinent to current admission diagnoses Updated medications and problem lists for reconciliation ED course, including vitals, labs, imaging, treatment and response to treatment Triage notes, nursing and pharmacy notes and ED provider's notes Notable results as noted in HPI   Assessment and Plan: * Acute appendicitis N.p.o. from midnight Pain control, IV antiemetics and IV hydration Surgery aware and plans to take patient to the OR in the a.m.  Diverticulosis of colon No evidence of acute diverticulitis on  CT  Cigarette smoker Nicotine patch  Bipolar disorder (HCC) Take  Hypertension Continue hydralazine, metoprolol and amlodipine Hydralazine IV as needed while n.p.o.  Type 2 diabetes mellitus with diabetic neuropathy, unspecified (HCC) Sliding scale insulin coverage   DVT prophylaxis: SCD  Consults: Surgery  Advance Care Planning:   Code Status: Full Code   Family Communication: none  Disposition Plan: Back to previous home environment  Severity of Illness: The appropriate patient status for this patient is INPATIENT. Inpatient status is judged to be reasonable and necessary in order to provide the required intensity of service to ensure the patient's safety. The patient's presenting symptoms, physical exam findings, and initial radiographic and laboratory data in the context of their chronic comorbidities is felt to place them at high risk for further clinical deterioration. Furthermore, it is not anticipated that the patient will be medically  stable for discharge from the hospital within 2 midnights of admission.   * I certify that at the point of admission it is my clinical judgment that the patient will require inpatient hospital care spanning beyond 2 midnights from the point of admission due to high intensity of service, high risk for further deterioration and high frequency of surveillance required.*  Author: Andris Baumann, MD 01/25/2023 7:59 PM  For on call review [email or web address].

## 2023-01-25 NOTE — Assessment & Plan Note (Signed)
No evidence of acute diverticulitis on CT

## 2023-01-25 NOTE — Assessment & Plan Note (Signed)
-  Nicotine patch 

## 2023-01-25 NOTE — Assessment & Plan Note (Signed)
Sliding scale insulin coverage 

## 2023-01-26 ENCOUNTER — Encounter: Payer: Self-pay | Admitting: Internal Medicine

## 2023-01-26 ENCOUNTER — Inpatient Hospital Stay: Payer: MEDICAID | Admitting: Certified Registered Nurse Anesthetist

## 2023-01-26 ENCOUNTER — Encounter
Admission: EM | Disposition: A | Discharge status: Home / Self Care | Payer: Self-pay | Source: Home / Self Care | Attending: Internal Medicine

## 2023-01-26 ENCOUNTER — Other Ambulatory Visit: Payer: Self-pay

## 2023-01-26 DIAGNOSIS — E1140 Type 2 diabetes mellitus with diabetic neuropathy, unspecified: Secondary | ICD-10-CM

## 2023-01-26 DIAGNOSIS — I10 Essential (primary) hypertension: Secondary | ICD-10-CM | POA: Diagnosis not present

## 2023-01-26 DIAGNOSIS — K3533 Acute appendicitis with perforation and localized peritonitis, with abscess: Secondary | ICD-10-CM | POA: Diagnosis not present

## 2023-01-26 DIAGNOSIS — Z794 Long term (current) use of insulin: Secondary | ICD-10-CM

## 2023-01-26 DIAGNOSIS — K3580 Unspecified acute appendicitis: Secondary | ICD-10-CM | POA: Diagnosis not present

## 2023-01-26 HISTORY — PX: LAPAROSCOPIC APPENDECTOMY: SHX408

## 2023-01-26 LAB — GLUCOSE, CAPILLARY
Glucose-Capillary: 113 mg/dL — ABNORMAL HIGH (ref 70–99)
Glucose-Capillary: 104 mg/dL — ABNORMAL HIGH (ref 70–99)
Glucose-Capillary: 165 mg/dL — ABNORMAL HIGH (ref 70–99)
Glucose-Capillary: 151 mg/dL — ABNORMAL HIGH (ref 70–99)
Glucose-Capillary: 129 mg/dL — ABNORMAL HIGH (ref 70–99)
Glucose-Capillary: 142 mg/dL — ABNORMAL HIGH (ref 70–99)

## 2023-01-26 LAB — HIV ANTIBODY (ROUTINE TESTING W REFLEX): HIV Screen 4th Generation wRfx: NONREACTIVE

## 2023-01-26 SURGERY — APPENDECTOMY, LAPAROSCOPIC
Anesthesia: General | Site: Abdomen

## 2023-01-26 MED ORDER — ONDANSETRON HCL 4 MG/2ML IJ SOLN
INTRAMUSCULAR | Status: AC
  Filled 2023-01-26: qty 2

## 2023-01-26 MED ORDER — LIDOCAINE HCL (CARDIAC) PF 100 MG/5ML IV SOSY
PREFILLED_SYRINGE | INTRAVENOUS | Status: DC | PRN
  Administered 2023-01-26: 100 mg via INTRAVENOUS

## 2023-01-26 MED ORDER — PROPOFOL 10 MG/ML IV BOLUS
INTRAVENOUS | Status: DC | PRN
Start: 2023-01-26 — End: 2023-01-26
  Administered 2023-01-26: 200 mg via INTRAVENOUS

## 2023-01-26 MED ORDER — OXYCODONE HCL 5 MG/5ML PO SOLN: 5.0000 mg | Freq: Once | ORAL | Status: AC | PRN

## 2023-01-26 MED ORDER — ACETAMINOPHEN 10 MG/ML IV SOLN
INTRAVENOUS | Status: AC
  Filled 2023-01-26: qty 100

## 2023-01-26 MED ORDER — SODIUM CHLORIDE 0.9 % IR SOLN
Status: DC | PRN
  Administered 2023-01-26: 800 mL

## 2023-01-26 MED ORDER — FENTANYL CITRATE (PF) 100 MCG/2ML IJ SOLN
INTRAMUSCULAR | Status: AC
  Filled 2023-01-26: qty 2

## 2023-01-26 MED ORDER — PROPOFOL 10 MG/ML IV BOLUS
INTRAVENOUS | Status: AC
  Filled 2023-01-26: qty 20

## 2023-01-26 MED ORDER — FENTANYL CITRATE (PF) 100 MCG/2ML IJ SOLN
INTRAMUSCULAR | Status: DC | PRN
  Administered 2023-01-26 (×2): 50 ug via INTRAVENOUS

## 2023-01-26 MED ORDER — SUGAMMADEX SODIUM 200 MG/2ML IV SOLN
INTRAVENOUS | Status: DC | PRN
  Administered 2023-01-26: 300 mg via INTRAVENOUS

## 2023-01-26 MED ORDER — MIDAZOLAM HCL 2 MG/2ML IJ SOLN
INTRAMUSCULAR | Status: AC
  Filled 2023-01-26: qty 2

## 2023-01-26 MED ORDER — SEVOFLURANE IN SOLN
RESPIRATORY_TRACT | Status: AC
  Filled 2023-01-26: qty 250

## 2023-01-26 MED ORDER — BUPIVACAINE LIPOSOME 1.3 % IJ SUSP
INTRAMUSCULAR | Status: AC
  Filled 2023-01-26: qty 20

## 2023-01-26 MED ORDER — DEXAMETHASONE SODIUM PHOSPHATE 10 MG/ML IJ SOLN
INTRAMUSCULAR | Status: DC | PRN
  Administered 2023-01-26: 10 mg via INTRAVENOUS

## 2023-01-26 MED ORDER — SUCCINYLCHOLINE CHLORIDE 200 MG/10ML IV SOSY
PREFILLED_SYRINGE | INTRAVENOUS | Status: DC | PRN
  Administered 2023-01-26: 100 mg via INTRAVENOUS

## 2023-01-26 MED ORDER — ROCURONIUM BROMIDE 100 MG/10ML IV SOLN
INTRAVENOUS | Status: DC | PRN
  Administered 2023-01-26: 30 mg via INTRAVENOUS
  Administered 2023-01-26: 10 mg via INTRAVENOUS

## 2023-01-26 MED ORDER — LIDOCAINE HCL (PF) 2 % IJ SOLN
INTRAMUSCULAR | Status: AC
  Filled 2023-01-26: qty 5

## 2023-01-26 MED ORDER — BUPIVACAINE-EPINEPHRINE 0.25% -1:200000 IJ SOLN
INTRAMUSCULAR | Status: DC | PRN
  Administered 2023-01-26: 30 mL

## 2023-01-26 MED ORDER — MIDAZOLAM HCL 2 MG/2ML IJ SOLN
INTRAMUSCULAR | Status: DC | PRN
  Administered 2023-01-26: 1 mg via INTRAVENOUS

## 2023-01-26 MED ORDER — HYDROMORPHONE HCL 1 MG/ML IJ SOLN
INTRAMUSCULAR | Status: AC
  Filled 2023-01-26: qty 1

## 2023-01-26 MED ORDER — ROCURONIUM BROMIDE 10 MG/ML (PF) SYRINGE
PREFILLED_SYRINGE | INTRAVENOUS | Status: AC
  Filled 2023-01-26: qty 10

## 2023-01-26 MED ORDER — HYDROMORPHONE HCL 1 MG/ML IJ SOLN
0.2500 mg | INTRAMUSCULAR | Status: DC | PRN
  Administered 2023-01-26 (×2): 0.25 mg via INTRAVENOUS

## 2023-01-26 MED ORDER — ONDANSETRON HCL 4 MG/2ML IJ SOLN
INTRAMUSCULAR | Status: DC | PRN
Start: 2023-01-26 — End: 2023-01-26
  Administered 2023-01-26: 4 mg via INTRAVENOUS

## 2023-01-26 MED ORDER — BUPIVACAINE-EPINEPHRINE (PF) 0.25% -1:200000 IJ SOLN
INTRAMUSCULAR | Status: AC
  Filled 2023-01-26: qty 30

## 2023-01-26 MED ORDER — ACETAMINOPHEN 10 MG/ML IV SOLN
INTRAVENOUS | Status: DC | PRN
  Administered 2023-01-26: 1000 mg via INTRAVENOUS

## 2023-01-26 MED ORDER — OXYCODONE HCL 5 MG PO TABS
ORAL_TABLET | ORAL | Status: AC
  Filled 2023-01-26: qty 1

## 2023-01-26 MED ORDER — BUPIVACAINE LIPOSOME 1.3 % IJ SUSP
INTRAMUSCULAR | Status: DC | PRN
  Administered 2023-01-26: 20 mL

## 2023-01-26 MED ORDER — OXYCODONE HCL 5 MG PO TABS
5.0000 mg | ORAL_TABLET | Freq: Once | ORAL | Status: AC | PRN
  Administered 2023-01-26: 5 mg via ORAL

## 2023-01-26 MED ORDER — DEXAMETHASONE SODIUM PHOSPHATE 10 MG/ML IJ SOLN
INTRAMUSCULAR | Status: AC
  Filled 2023-01-26: qty 1

## 2023-01-26 SURGICAL SUPPLY — 46 items
ADH SKN CLS APL DERMABOND .7 (GAUZE/BANDAGES/DRESSINGS) ×1
APPLIER CLIP 5 13 M/L LIGAMAX5 (MISCELLANEOUS)
APR CLP MED LRG 5 ANG JAW (MISCELLANEOUS)
BLADE CLIPPER SURG (BLADE) ×1 IMPLANT
BULB RESERV EVAC DRAIN JP 100C (MISCELLANEOUS) IMPLANT
CLIP APPLIE 5 13 M/L LIGAMAX5 (MISCELLANEOUS) IMPLANT
CUTTER FLEX LINEAR 45M (STAPLE) ×1 IMPLANT
DERMABOND ADVANCED .7 DNX12 (GAUZE/BANDAGES/DRESSINGS) ×1 IMPLANT
DRAIN CHANNEL JP 19F (MISCELLANEOUS) IMPLANT
DRSG TEGADERM 4X4.75 (GAUZE/BANDAGES/DRESSINGS) IMPLANT
ELECT CAUTERY BLADE 6.4 (BLADE) ×1 IMPLANT
ELECT CAUTERY BLADE TIP 2.5 (TIP) ×1
ELECT REM PT RETURN 9FT ADLT (ELECTROSURGICAL) ×1
ELECTRODE CAUTERY BLDE TIP 2.5 (TIP) ×1 IMPLANT
ELECTRODE REM PT RTRN 9FT ADLT (ELECTROSURGICAL) ×1 IMPLANT
GLOVE BIO SURGEON STRL SZ7 (GLOVE) ×1 IMPLANT
GOWN STRL REUS W/ TWL LRG LVL3 (GOWN DISPOSABLE) ×2 IMPLANT
GOWN STRL REUS W/TWL LRG LVL3 (GOWN DISPOSABLE) ×2
IRRIGATION STRYKERFLOW (MISCELLANEOUS) ×1 IMPLANT
IRRIGATOR STRYKERFLOW (MISCELLANEOUS) ×1
IV NS 1000ML (IV SOLUTION) ×1
IV NS 1000ML BAXH (IV SOLUTION) ×1 IMPLANT
MANIFOLD NEPTUNE II (INSTRUMENTS) ×1 IMPLANT
NDL HYPO 22X1.5 SAFETY MO (MISCELLANEOUS) ×1 IMPLANT
NEEDLE HYPO 22X1.5 SAFETY MO (MISCELLANEOUS) ×1 IMPLANT
NS IRRIG 500ML POUR BTL (IV SOLUTION) ×1 IMPLANT
PACK LAP CHOLECYSTECTOMY (MISCELLANEOUS) ×1 IMPLANT
PENCIL SMOKE EVACUATOR (MISCELLANEOUS) ×1 IMPLANT
RELOAD STAPLE 45 3.5 BLU ETS (ENDOMECHANICALS) ×1 IMPLANT
RELOAD STAPLE TA45 3.5 REG BLU (ENDOMECHANICALS) ×2 IMPLANT
SCISSORS METZENBAUM CVD 33 (INSTRUMENTS) IMPLANT
SET TUBE SMOKE EVAC HIGH FLOW (TUBING) ×1 IMPLANT
SHEARS HARMONIC ACE PLUS 36CM (ENDOMECHANICALS) ×1 IMPLANT
SLEEVE Z-THREAD 5X100MM (TROCAR) ×1 IMPLANT
SPONGE DRAIN TRACH 4X4 STRL 2S (GAUZE/BANDAGES/DRESSINGS) IMPLANT
SPONGE T-LAP 18X18 ~~LOC~~+RFID (SPONGE) ×1 IMPLANT
SUT ETHILON 3-0 FS-10 30 BLK (SUTURE) ×1
SUT MNCRL AB 4-0 PS2 18 (SUTURE) ×1 IMPLANT
SUT VICRYL 0 UR6 27IN ABS (SUTURE) ×2 IMPLANT
SUTURE EHLN 3-0 FS-10 30 BLK (SUTURE) IMPLANT
SYR 20ML LL LF (SYRINGE) ×1 IMPLANT
SYS BAG RETRIEVAL 10MM (BASKET) ×1
SYSTEM BAG RETRIEVAL 10MM (BASKET) ×1 IMPLANT
TRAP FLUID SMOKE EVACUATOR (MISCELLANEOUS) ×1 IMPLANT
TROCAR BALLN 12MMX100 BLUNT (TROCAR) ×1 IMPLANT
TROCAR Z-THREAD FIOS 5X100MM (TROCAR) ×1 IMPLANT

## 2023-01-26 NOTE — Anesthesia Procedure Notes (Signed)
Procedure Name: Intubation Date/Time: 01/26/2023 10:58 AM  Performed by: Ginger Carne, CRNAPre-anesthesia Checklist: Patient identified, Emergency Drugs available, Suction available, Patient being monitored and Timeout performed Patient Re-evaluated:Patient Re-evaluated prior to induction Oxygen Delivery Method: Circle system utilized Preoxygenation: Pre-oxygenation with 100% oxygen Induction Type: IV induction, Rapid sequence and Cricoid Pressure applied Laryngoscope Size: McGraph and 4 Grade View: Grade I Tube type: Oral Tube size: 7.0 mm Number of attempts: 1 Airway Equipment and Method: Stylet and Video-laryngoscopy Placement Confirmation: ETT inserted through vocal cords under direct vision, positive ETCO2 and breath sounds checked- equal and bilateral Secured at: 21 cm Tube secured with: Tape Dental Injury: Teeth and Oropharynx as per pre-operative assessment

## 2023-01-26 NOTE — Anesthesia Preprocedure Evaluation (Signed)
Anesthesia Evaluation  Patient identified by MRN, date of birth, ID band Patient awake    Reviewed: Allergy & Precautions, NPO status , Patient's Chart, lab work & pertinent test results  History of Anesthesia Complications Negative for: history of anesthetic complications  Airway Mallampati: III  TM Distance: >3 FB Neck ROM: full    Dental  (+) Poor Dentition   Pulmonary neg pulmonary ROS, Current Smoker and Patient abstained from smoking.   Pulmonary exam normal        Cardiovascular hypertension, On Medications (-) angina (-) DOE negative cardio ROS Normal cardiovascular exam     Neuro/Psych  PSYCHIATRIC DISORDERS  Depression Bipolar Disorder   negative neurological ROS     GI/Hepatic negative GI ROS, Neg liver ROS,,,  Endo/Other  negative endocrine ROSdiabetes, Type 2  On ozempic, does not remember last dose. Thinks he may have skipped his dose last week  Renal/GU      Musculoskeletal  (+) Arthritis ,    Abdominal   Peds  Hematology negative hematology ROS (+)   Anesthesia Other Findings Past Medical History: No date: Arthritis No date: Bipolar disorder (HCC) No date: Bursitis No date: Depression No date: Diabetes mellitus without complication (HCC) No date: Diverticulitis No date: Hyperlipidemia No date: Hypertension No date: Psychosis (HCC) No date: Psychosis [facility])  Past Surgical History: 05/03/2018: COLONOSCOPY WITH PROPOFOL; N/A     Comment:  Procedure: COLONOSCOPY WITH PROPOFOL;  Surgeon: Toney Reil, MD;  Location: [facility] ENDOSCOPY;  Service:               Gastroenterology;  Laterality: N/A; 07/06/2019: HEMORRHOID SURGERY; N/A     Comment:  Procedure: HEMORRHOIDECTOMY;  Surgeon: [name],              MD;  Location: [facility] ORS;  Service: General;  Laterality:               N/A; 07/06/2019: RECTAL EXAM UNDER ANESTHESIA; N/A     Comment:  Procedure: RECTAL EXAM UNDER  ANESTHESIA;  Surgeon:               [name], MD;  Location: [facility] ORS;  Service:               General;  Laterality: N/A;  BMI    Body Mass Index: 32.14 kg/m      Reproductive/Obstetrics negative OB ROS                             Anesthesia Physical Anesthesia Plan  ASA: 3  Anesthesia Plan: General ETT   Post-op Pain Management: Tylenol PO (pre-op)*, Gabapentin PO (pre-op)* and Celebrex PO (pre-op)*   Induction: Intravenous  PONV Risk Score and Plan: 2 and Ondansetron, Dexamethasone, Midazolam and Treatment may vary due to age or medical condition  Airway Management Planned: Oral ETT  Additional Equipment:   Intra-op Plan:   Post-operative Plan: Extubation in OR  Informed Consent: I have reviewed the patients History and Physical, chart, labs and discussed the procedure including the risks, benefits and alternatives for the proposed anesthesia with the patient or authorized representative who has indicated his/her understanding and acceptance.     Dental Advisory Given  Plan Discussed with: Anesthesiologist, CRNA and Surgeon  Anesthesia Plan Comments: (Patient consented for risks of anesthesia including but not limited to:  - adverse reactions to medications -  damage to eyes, teeth, lips or other oral mucosa - nerve damage due to positioning  - sore throat or hoarseness - Damage to heart, brain, nerves, lungs, other parts of body or loss of life  Patient voiced understanding.)       Anesthesia Quick Evaluation

## 2023-01-26 NOTE — Progress Notes (Signed)
  Progress Note   Patient: Lance Vaughan QIO:[id number] DOB: [birth year] DOA: 01/25/2023     1 DOS: the patient was seen and examined on 01/26/2023   Brief hospital course: Taken from H&P.   Lance Vaughan is a 57 y.o. male with medical history significant for DM, HTN, diverticulitis in the past who presents to the ED with a 1 day history of right lower quadrant pain associated with decreased oral intake.   CT abdomen concerning for acute appendicitis, general surgery was consulted.  Patient was started on Zosyn.  8/5: Hemodynamically stable, s/p appendectomy and drain placement.  He was found to have severe appendicitis with contained perforation and a small abscess.  General surgery is advising continue IV antibiotics today and discharged on p.o. for 10 days tomorrow.    Assessment and Plan: * Acute appendicitis S/p appendectomy and a drain placement.  Found to have severe appendicitis and an abscess with contained perforation. -Continue with Zosyn-if remains stable can be discharged tomorrow on Cipro and Flagyl for total of 10 days. -Continue with supportive care  Diverticulosis of colon No evidence of acute diverticulitis on CT  Cigarette smoker Nicotine patch  Bipolar disorder (HCC) Take  Hypertension Continue hydralazine, metoprolol and amlodipine Hydralazine IV as needed .  Type 2 diabetes mellitus with diabetic neuropathy, unspecified (HCC) Sliding scale insulin coverage   Subjective: Patient was seen after the surgery.  Denies any pain.  He was somnolent secondary to recent anesthesia.  Physical Exam: Vitals:   01/26/23 1301 01/26/23 1315 01/26/23 1330 01/26/23 1403  BP: 121/75 123/82 121/70 127/77  Pulse: 74 66 65 60  Resp: (!) 21 14 13 17   Temp: 98 F (36.7 C)   97.6 F (36.4 C)  TempSrc:    Oral  SpO2: 97% 95% 95% 100%  Weight:      Height:       General.  Obese gentleman, in no acute distress. Pulmonary.  Lungs clear bilaterally, normal respiratory  effort. CV.  Regular rate and rhythm, no JVD, rub or murmur. Abdomen.  Soft, mild diffuse tenderness, BS positive.  RLQ drain in place CNS.  Alert and oriented .  No focal neurologic deficit. Extremities.  No edema, no cyanosis, pulses intact and symmetrical. Psychiatry.  Judgment and insight appears normal.   Data Reviewed: Prior data reviewed  Family Communication: Discussed with wife at bedside  Disposition: Status is: Inpatient Remains inpatient appropriate because: Severity of illness  Planned Discharge Destination: Home  Time spent: 45 minutes  This record has been created using Conservation officer, historic buildings. Errors have been sought and corrected,but may not always be located. Such creation errors do not reflect on the standard of care.   Author: Arnetha Courser, MD 01/26/2023 3:23 PM  For on call review [email or web address].

## 2023-01-26 NOTE — Plan of Care (Signed)
  Problem: Fluid Volume: Goal: Ability to maintain a balanced intake and output will improve Outcome: Progressing   Problem: Education: Goal: Ability to describe self-care measures that may prevent or decrease complications (Diabetes Survival Skills Education) will improve Outcome: Progressing   Problem: Fluid Volume: Goal: Ability to maintain a balanced intake and output will improve Outcome: Progressing   Problem: Coping: Goal: Ability to adjust to condition or change in health will improve Outcome: Progressing   Problem: Tissue Perfusion: Goal: Adequacy of tissue perfusion will improve Outcome: Progressing

## 2023-01-26 NOTE — Plan of Care (Signed)
  Problem: Education: Goal: Ability to describe self-care measures that may prevent or decrease complications (Diabetes Survival Skills Education) will improve Outcome: Progressing Goal: Individualized Educational Video(s) Outcome: Progressing   Problem: Coping: Goal: Ability to adjust to condition or change in health will improve Outcome: Progressing   Problem: Fluid Volume: Goal: Ability to maintain a balanced intake and output will improve Outcome: Progressing   Problem: Health Behavior/Discharge Planning: Goal: Ability to identify and utilize available resources and services will improve Outcome: Progressing Goal: Ability to manage health-related needs will improve Outcome: Progressing   Problem: Metabolic: Goal: Ability to maintain appropriate glucose levels will improve Outcome: Progressing   Problem: Nutritional: Goal: Maintenance of adequate nutrition will improve Outcome: Progressing Goal: Progress toward achieving an optimal weight will improve Outcome: Progressing   Problem: Skin Integrity: Goal: Risk for impaired skin integrity will decrease Outcome: Progressing   Problem: Tissue Perfusion: Goal: Adequacy of tissue perfusion will improve Outcome: Progressing   Problem: Education: Goal: Knowledge of General Education information will improve Description: Including pain rating scale, medication(s)/side effects and non-pharmacologic comfort measures Outcome: Progressing   Problem: Health Behavior/Discharge Planning: Goal: Ability to manage health-related needs will improve Outcome: Progressing   Problem: Clinical Measurements: Goal: Ability to maintain clinical measurements within normal limits will improve Outcome: Progressing Goal: Will remain free from infection Outcome: Progressing Goal: Diagnostic test results will improve Outcome: Progressing Goal: Respiratory complications will improve Outcome: Progressing Goal: Cardiovascular complication will  be avoided Outcome: Progressing   Problem: Activity: Goal: Risk for activity intolerance will decrease Outcome: Progressing   Problem: Nutrition: Goal: Adequate nutrition will be maintained Outcome: Progressing   Problem: Coping: Goal: Level of anxiety will decrease Outcome: Progressing   Problem: Elimination: Goal: Will not experience complications related to bowel motility Outcome: Progressing Goal: Will not experience complications related to urinary retention Outcome: Progressing   Problem: Pain Managment: Goal: General experience of comfort will improve Outcome: Progressing   Problem: Safety: Goal: Ability to remain free from injury will improve Outcome: Progressing   Problem: Skin Integrity: Goal: Risk for impaired skin integrity will decrease Outcome: Progressing    Pt Aox4, respirations even and unlabored on RA. Pt has had of output from JP drain since returning to unit. Pt vitals stable. Pt ambulating to restroom with SBA. Pt pain controlled well with prns. Pt tolerating diet well without issues. Pt now resting in bed and has no complaints at this time

## 2023-01-26 NOTE — Transfer of Care (Signed)
Immediate Anesthesia Transfer of Care Note  Patient: Lance Vaughan  Procedure(s) Performed: APPENDECTOMY LAPAROSCOPIC (Abdomen)  Patient Location: PACU  Anesthesia Type:General  Level of Consciousness: drowsy  Airway & Oxygen Therapy: Patient Spontanous Breathing and Patient connected to face mask oxygen  Post-op Assessment: Report given to RN and Post -op Vital signs reviewed and stable  Post vital signs: Reviewed and stable  Last Vitals:  Vitals Value Taken Time  BP 128/83 01/26/23 1216  Temp    Pulse 72 01/26/23 1216  Resp 23 01/26/23 1216  SpO2 100 % 01/26/23 1216    Last Pain:  Vitals:   01/26/23 1023  TempSrc: Oral  PainSc: 7       Patients Stated Pain Goal: 4 (01/26/23 0501)  Complications: No notable events documented.

## 2023-01-26 NOTE — Op Note (Signed)
laparascopic appendectomy   Lance Vaughan Date of operation:  01/26/2023  Indications: The patient presented with a history of  abdominal pain. Workup has revealed findings consistent with acute appendicitis.  Pre-operative Diagnosis: Acute appendicitis without mention of peritonitis  Post-operative Diagnosis: Severe Appendicitis with contained perforation and phlegmonous changes with small abscess  Surgeon: Sterling Big, MD, FACS  Anesthesia: General with endotracheal tube  Findings:  Severe Appendicitis with contained perforation and phlegmonous changes with small abscess  Estimated Blood Loss: 10cc         Specimens: appendix         Complications:  none  Procedure Details  The patient was seen again in the preop area. The options of surgery versus observation were reviewed with the patient and/or family. The risks of bleeding, infection, recurrence of symptoms, negative laparoscopy, potential for an open procedure, bowel injury, abscess or infection, were all reviewed as well. The patient was taken to Operating Room, identified as Lance Vaughan and the procedure verified as laparoscopic appendectomy. A Time Out was held and the above information confirmed.  The patient was placed in the supine position and general anesthesia was induced.  Antibiotic prophylaxis was administered and VT E prophylaxis was in place.   The abdomen was prepped and draped in a sterile fashion. An infraumbilical incision was made. A cutdown technique was used to enter the abdominal cavity. Two vicryl stitches were placed on the fascia and a Hasson trocar inserted. Pneumoperitoneum obtained. Two 5 mm ports were placed under direct visualization.   The appendix was identified and found to be acutely inflamed , phlegmonous changes and severe inflammatory response on cecum and appendix.  The appendix was carefully dissected. The mesoappendix was divided withHarmonic scalpel. The wall was found to be perforated, it  was a contained perforation. Small amount of pus was drained and aspirated The base of the appendix was dissected out and divided with a standard load Endo GIA.The appendix was placed in a Endo Catch bag and removed via the Hasson port. The right lower quadrant and pelvis was then irrigated with  normal saline which was aspirated. Inspection  failed to identify any additional bleeding and there were no signs of bowel injury.  19 FR blake drain placed RLQ due to abscess and phlegmon Again the right lower quadrant was inspected there was no sign of bleeding or bowel injury therefore pneumoperitoneum was released, all ports were removed.  The umbilical fascia was closed with 0 Vicryl interrupted sutures and the skin incisions were approximated with subcuticular 4-0 Monocryl. Dermabond was placed The patient tolerated the procedure well, there were no complications. The sponge lap and needle count were correct at the end of the procedure.  The patient was taken to the recovery room in stable condition to be admitted for continued care.    Sterling Big, MD FACS

## 2023-01-26 NOTE — Anesthesia Postprocedure Evaluation (Signed)
Anesthesia Post Note  Patient: Lance Vaughan  Procedure(s) Performed: APPENDECTOMY LAPAROSCOPIC (Abdomen)  Patient location during evaluation: PACU Anesthesia Type: General Level of consciousness: awake and alert Pain management: pain level controlled Vital Signs Assessment: post-procedure vital signs reviewed and stable Respiratory status: spontaneous breathing, nonlabored ventilation, respiratory function stable and patient connected to nasal cannula oxygen Cardiovascular status: blood pressure returned to baseline and stable Postop Assessment: no apparent nausea or vomiting Anesthetic complications: no Comments: Patient handed PACU nurse a tooth upon arrival. Patient states the tooth was rotten and needed to come out. Tooth appears to be in tact including roots. No significant bleeding noted. Patient is not complaining of any pain. Advised patient to follow up with dentist in the next 1-2weeks.    No notable events documented.   Last Vitals:  Vitals:   01/26/23 1216 01/26/23 1230  BP: 128/83 131/79  Pulse: 72 78  Resp: (!) 23 20  Temp:    SpO2: 100% 97%    Last Pain:  Vitals:   01/26/23 1215  TempSrc:   PainSc: Asleep                 Louie Boston

## 2023-01-26 NOTE — Consult Note (Signed)
[facility] SURGICAL CONSULTATION NOTE (initial) - cpt: 99244   HISTORY OF PRESENT ILLNESS (HPI):  57 y.o. male presented to [facility] ED yesterday for evaluation of abdominal pain. Patient reports the acute onset of diffuse lower abdominal pain last yesterday around 2 PM/ This was sharp in nature. Accompanied by fever, chills, nausea. No CP, SOB, emesis, or bowel changes. No history of similar in the past. No previous abdominal surgeries. Work up in the ED revealed a normal WBC at 6.7K, renal function normal with sCr - 1.08; no electrolyte derangements. CT Abdomen/Pelvis was concerning for acute appendicitis with appendicolith. He was admitted to medicine. He is on Zosyn.   Surgery is consulted by emergency medicine provider Ronnie Doss, PA-C in this context for evaluation and management of acute appendicitis.  PAST MEDICAL HISTORY (PMH):  Past Medical History:  Diagnosis Date   Arthritis    Bipolar disorder (HCC)    Bursitis    Depression    Diabetes mellitus without complication (HCC)    Diverticulitis    Hyperlipidemia    Hypertension    Psychosis (HCC)    Psychosis (HCC)      PAST SURGICAL HISTORY (PSH):  Past Surgical History:  Procedure Laterality Date   COLONOSCOPY WITH PROPOFOL N/A 05/03/2018   Procedure: COLONOSCOPY WITH PROPOFOL;  Surgeon: Toney Reil, MD;  Location: [facility] ENDOSCOPY;  Service: Gastroenterology;  Laterality: N/A;   HEMORRHOID SURGERY N/A 07/06/2019   Procedure: HEMORRHOIDECTOMY;  Surgeon: [name], MD;  Location: [facility] ORS;  Service: General;  Laterality: N/A;   RECTAL EXAM UNDER ANESTHESIA N/A 07/06/2019   Procedure: RECTAL EXAM UNDER ANESTHESIA;  Surgeon: [name], MD;  Location: [facility] ORS;  Service: General;  Laterality: N/A;     MEDICATIONS:  Prior to Admission medications   Medication Sig Start Date End Date Taking? Authorizing Provider  Accu-Chek Softclix Lancets lancets 1 each by Other route daily. Use as  instructed 05/21/22   Larae Grooms, NP  albuterol (VENTOLIN HFA) 108 (90 Base) MCG/ACT inhaler Inhale 2 puffs into the lungs every 6 (six) hours as needed for wheezing or shortness of breath. Rescue inhaler 04/15/22   Larae Grooms, NP  amLODipine (NORVASC) 10 MG tablet TAKE 1 TABLET BY MOUTH ONCE DAILY 04/16/22   Aura Dials T, NP  aspirin EC 81 MG tablet Take 1 tablet (81 mg total) by mouth daily. 02/25/18   Tukov-Yual, Alroy Bailiff, NP  atorvastatin (LIPITOR) 40 MG tablet TAKE 1 TABLET BY MOUTH AT BEDTIME 07/24/22   Cannady, Jolene T, NP  Blood Glucose Monitoring Suppl (ACCU-CHEK AVIVA PLUS) w/Device KIT 1 each by Does not apply route daily. 05/21/22   Larae Grooms, NP  dapagliflozin propanediol (FARXIGA) 10 MG TABS tablet Take 1 tablet (10 mg total) by mouth daily. needs appointment for further refills 10/01/22   Larae Grooms, NP  gabapentin (NEURONTIN) 600 MG tablet Take 1 tablet (600 mg total) by mouth 3 (three) times daily as needed. 04/15/22   Larae Grooms, NP  glucose blood (ACCU-CHEK AVIVA PLUS) test strip TEST TWICE DAILY 05/20/22   Larae Grooms, NP  ibuprofen (ADVIL) 800 MG tablet Take 1 tablet (800 mg total) by mouth every 8 (eight) hours as needed. 07/06/19   [name], MD  metoprolol succinate (TOPROL-XL) 25 MG 24 hr tablet TAKE 1 TABLET BY MOUTH ONCE DAILY 07/19/21   Johnson, Megan P, DO  OZEMPIC, 0.25 OR 0.5 MG/DOSE, 2 MG/3ML SOPN INJECT 0.5MG  SUBCUTANEOUSLY ONCE A WEEK 01/09/23   Larae Grooms, NP  polyethylene glycol powder (GLYCOLAX/MIRALAX) 17 GM/SCOOP powder Take 17 g by mouth 2 (two) times daily as needed. 07/24/21   Vigg, Avanti, MD  ULTRACARE PEN NEEDLES 32G X 4 MM MISC USE 3 TIMES DAILY AS NEEDED 07/30/20   Larae Grooms, NP  valsartan (DIOVAN) 80 MG tablet Take 1 tablet (80 mg total) by mouth daily. 07/23/22   Larae Grooms, NP     ALLERGIES:  Allergies  Allergen Reactions   Lisinopril Other (See Comments)    Chest pain      SOCIAL HISTORY:  Social History   Socioeconomic History   Marital status: Single    Spouse name: Not on file   Number of children: 4   Years of education: Not on file   Highest education level: GED or equivalent  Occupational History   Occupation: disability  Tobacco Use   Smoking status: Every Day    Current packs/day: 2.00    Average packs/day: 2.0 packs/day for 30.0 years (60.0 ttl pk-yrs)    Types: Cigarettes   Smokeless tobacco: Never  Vaping Use   Vaping status: Never Used  Substance and Sexual Activity   Alcohol use: Yes   Drug use: No   Sexual activity: Yes  Other Topics Concern   Not on file  Social History Narrative   Patient is currently living with his ex-wife, but is looking for other housing options.  He gets food when he has a ride to the foodbank.   Social Determinants of Health   Financial Resource Strain: High Risk (11/19/2017)   Overall Financial Resource Strain (CARDIA)    Difficulty of Paying Living Expenses: Very hard  Food Insecurity: Food Insecurity Present (01/26/2023)   Hunger Vital Sign    Worried About Running Out of Food in the Last Year: Sometimes true    Ran Out of Food in the Last Year: Sometimes true  Transportation Needs: No Transportation Needs (01/26/2023)   PRAPARE - Administrator, Civil Service (Medical): No    Lack of Transportation (Non-Medical): No  Physical Activity: Insufficiently Active (11/19/2017)   Exercise Vital Sign    Days of Exercise per Week: 3 days    Minutes of Exercise per Session: 10 min  Stress: Stress Concern Present (11/19/2017)   [employer] of Occupational Health - Occupational Stress Questionnaire    Feeling of Stress : Rather much  Social Connections: Socially Isolated (11/19/2017)   Social Connection and Isolation Panel [NHANES]    Frequency of Communication with Friends and Family: Once a week    Frequency of Social Gatherings with Friends and Family: Never    Attends Religious Services:  Never    Database administrator or Organizations: No    Attends Banker Meetings: Never    Marital Status: Separated  Intimate Partner Violence: Not At Risk (01/26/2023)   Humiliation, Afraid, Rape, and Kick questionnaire    Fear of Current or Ex-Partner: No    Emotionally Abused: No    Physically Abused: No    Sexually Abused: No     FAMILY HISTORY:  Family History  Problem Relation Age of Onset   Hypertension Mother    Hypertension Sister    Hyperlipidemia Sister    Hyperlipidemia Sister    Hypertension Sister    Diabetes Maternal Grandmother       REVIEW OF SYSTEMS:  Review of Systems  Constitutional:  Positive for chills and fever.  Respiratory:  Negative for cough and shortness of breath.  Cardiovascular:  Negative for chest pain and palpitations.  Gastrointestinal:  Positive for abdominal pain and nausea. Negative for constipation, diarrhea and vomiting.  All other systems reviewed and are negative.   VITAL SIGNS:  Temp:  [98.2 F (36.8 C)-99.9 F (37.7 C)] 99.9 F (37.7 C) (08/05 0455) Pulse Rate:  [63-99] 82 (08/05 0455) Resp:  [16-20] 16 (08/05 0455) BP: (115-183)/(68-109) 115/68 (08/05 0455) SpO2:  [95 %-100 %] 95 % (08/05 0455) Weight:  [98.4 kg-104.3 kg] 98.4 kg (08/04 2055)     Height: 6' (182.9 cm) Weight: 98.4 kg BMI (Calculated): 29.42   INTAKE/OUTPUT:  No intake/output data recorded.  PHYSICAL EXAM:  Physical Exam Vitals and nursing note reviewed.  Constitutional:      General: He is not in acute distress.    Appearance: He is well-developed and normal weight. He is not ill-appearing.  HENT:     Head: Normocephalic and atraumatic.  Eyes:     General: No scleral icterus.    Extraocular Movements: Extraocular movements intact.  Cardiovascular:     Rate and Rhythm: Normal rate.     Heart sounds: Normal heart sounds. No murmur heard. Pulmonary:     Effort: Pulmonary effort is normal. No respiratory distress.     Breath sounds:  Normal breath sounds.  Abdominal:     General: Abdomen is flat. There is no distension.     Palpations: Abdomen is soft.     Tenderness: There is abdominal tenderness in the right lower quadrant, suprapubic area and left lower quadrant. There is no guarding or rebound.  Genitourinary:    Comments: Deferred Skin:    General: Skin is warm and dry.     Coloration: Skin is not jaundiced.  Neurological:     General: No focal deficit present.     Mental Status: He is alert and oriented to person, place, and time.  Psychiatric:        Mood and Affect: Mood normal.        Behavior: Behavior normal.      Labs:     Latest Ref Rng & Units 01/25/2023    3:29 PM 04/15/2022    1:27 PM 07/09/2021   10:35 AM  CBC  WBC 4.0 - 10.5 K/uL 6.7  4.2  4.8   Hemoglobin 13.0 - 17.0 g/dL 43.3  29.5  18.8   Hematocrit 39.0 - 52.0 % 47.3  43.9  50.0   Platelets 150 - 400 K/uL 277  332  299       Latest Ref Rng & Units 01/25/2023    3:29 PM 10/01/2022   11:02 AM 04/15/2022    1:27 PM  CMP  Glucose 70 - 99 mg/dL 416  606  301   BUN 6 - 20 mg/dL 11  16  17    Creatinine 0.61 - 1.24 mg/dL 6.01  0.93  2.35   Sodium 135 - 145 mmol/L 139  140  141   Potassium 3.5 - 5.1 mmol/L 3.8  3.9  4.2   Chloride 98 - 111 mmol/L 104  107  106   CO2 22 - 32 mmol/L 25  19  21    Calcium 8.9 - 10.3 mg/dL 9.0  8.9  8.7   Total Protein 6.5 - 8.1 g/dL 8.0  6.5  6.6   Total Bilirubin 0.3 - 1.2 mg/dL 0.4  <5.7  0.3   Alkaline Phos 38 - 126 U/L 65  73  72   AST 15 - 41 U/L 32  27  29   ALT 0 - 44 U/L 40  40  43      Imaging studies:   CT Abdomen/Pelvis (01/25/2023) personally reviewed with inflammation about appendix, appendicolith, no free air, no abscess, and radiologist report reviewed below:  IMPRESSION: 1. Acute appendicitis.  No evidence of abscess. 2. Bowel wall thickening of the distal descending and proximal sigmoid colon in an area of diverticulosis is indeterminate, but is favored to reflect chronic changes of  diverticulosis as opposed to acute diverticulitis.  Assessment/Plan: (ICD-10's: K35.30) 57 y.o. male with acute appendicitis   - Plan for laparoscopic appendectomy with Dr Everlene Farrier this afternoon pending OR/Anesthesia availability    - All risks, benefits, and alternatives to above procedure(s) were discussed with the patient, all of his questions were answered to his expressed satisfaction, patient expresses he wishes to proceed, and informed consent was obtained.    - NPO + IVF Resuscitation - Continue IV Abx (Zosyn) - Monitor abdominal examination - Pain control prn; antiemetics prn - Mobilize as tolerated - Further management per primary service    All of the above findings and recommendations were discussed with the patient, and all of patient's questions were answered to his expressed satisfaction.  Thank you for the opportunity to participate in this patient's care.   -- Lynden Oxford, PA-C [facility] 01/26/2023, 7:40 AM M-F: 7am - 4pm

## 2023-01-26 NOTE — Progress Notes (Signed)
Pt back on unit after procedure. Pt ao x4, respirations even and unlabored on RA. Dressings assessed with PACU RN, Dressings CDI. JP drain in place. Pt has  some pain on arrival to unit, see mar. Pt tolerating fluid without issue. Vitals signs stable at this time

## 2023-01-26 NOTE — Hospital Course (Addendum)
Taken from H&P.   Lance Vaughan is a 57 y.o. male with medical history significant for DM, HTN, diverticulitis in the past who presents to the ED with a 1 day history of right lower quadrant pain associated with decreased oral intake.   CT abdomen concerning for acute appendicitis, general surgery was consulted.  Patient was started on Zosyn.  8/5: Hemodynamically stable, s/p appendectomy and drain placement.  He was found to have severe appendicitis with contained perforation and a small abscess.  General surgery is advising continue IV antibiotics today and discharged on p.o. for 10 days tomorrow.  8/6: Vital stable, labs with some AKI with creatinine at 1.46, 1 L of IV fluid was ordered by general surgery.  Hemoglobin 12.3.  Surgery cleared him for discharge on Augmentin for 7 more days. Patient will be living with drain and he will follow-up with general surgery as outpatient for further management.  Patient will continue on current management.  General surgery will repeat BMP in the clinic. He is being discharged with a drain in place which will be removed as outpatient per general surgery.  Patient is advised to keep himself well-hydrated and continue taking current medications. He will follow-up with general surgery and his primary care provider for further recommendations.

## 2023-01-27 ENCOUNTER — Encounter: Payer: Self-pay | Admitting: Surgery

## 2023-01-27 DIAGNOSIS — E1140 Type 2 diabetes mellitus with diabetic neuropathy, unspecified: Secondary | ICD-10-CM | POA: Diagnosis not present

## 2023-01-27 DIAGNOSIS — Z794 Long term (current) use of insulin: Secondary | ICD-10-CM | POA: Diagnosis not present

## 2023-01-27 DIAGNOSIS — K3533 Acute appendicitis with perforation and localized peritonitis, with abscess: Secondary | ICD-10-CM | POA: Diagnosis not present

## 2023-01-27 DIAGNOSIS — I10 Essential (primary) hypertension: Secondary | ICD-10-CM | POA: Diagnosis not present

## 2023-01-27 LAB — GLUCOSE, CAPILLARY
Glucose-Capillary: 120 mg/dL — ABNORMAL HIGH (ref 70–99)
Glucose-Capillary: 102 mg/dL — ABNORMAL HIGH (ref 70–99)

## 2023-01-27 MED ORDER — AMOXICILLIN-POT CLAVULANATE 875-125 MG PO TABS: 1.0000 | ORAL_TABLET | Freq: Two times a day (BID) | ORAL | 0 refills | Status: DC

## 2023-01-27 MED ORDER — OXYCODONE HCL 5 MG PO TABS: 5.0000 mg | ORAL_TABLET | Freq: Four times a day (QID) | ORAL | 0 refills | Status: DC | PRN

## 2023-01-27 MED ORDER — SODIUM CHLORIDE 0.9 % IV BOLUS
1000.0000 mL | Freq: Once | INTRAVENOUS | Status: AC
  Administered 2023-01-27: 1000 mL via INTRAVENOUS

## 2023-01-27 NOTE — Discharge Summary (Signed)
Physician Discharge Summary   Patient: Lance Vaughan MRN: [id number] DOB: [birth year]  Admit date:     01/25/2023  Discharge date: 01/27/23  Discharge Physician: Arnetha Courser   PCP: Larae Grooms, NP   Recommendations at discharge:  Please obtain CBC and BMP during follow-up appointment Patient is being discharged with drain in place which will be taken out in the clinic Follow-up with general surgery Follow-up with PCP  Discharge Diagnoses: Principal Problem:   Acute appendicitis Active Problems:   Diverticulosis of colon   Type 2 diabetes mellitus with diabetic neuropathy, unspecified (HCC)   Hypertension   Cigarette smoker   Hospital Course: Taken from H&P.   Lance Vaughan is a 57 y.o. male with medical history significant for DM, HTN, diverticulitis in the past who presents to the ED with a 1 day history of right lower quadrant pain associated with decreased oral intake.   CT abdomen concerning for acute appendicitis, general surgery was consulted.  Patient was started on Zosyn.  8/5: Hemodynamically stable, s/p appendectomy and drain placement.  He was found to have severe appendicitis with contained perforation and a small abscess.  General surgery is advising continue IV antibiotics today and discharged on p.o. for 10 days tomorrow.  8/6: Vital stable, labs with some AKI with creatinine at 1.46, 1 L of IV fluid was ordered by general surgery.  Hemoglobin 12.3.  Surgery cleared him for discharge on Augmentin for 7 more days. Patient will be living with drain and he will follow-up with general surgery as outpatient for further management.  Patient will continue on current management.  General surgery will repeat BMP in the clinic. He is being discharged with a drain in place which will be removed as outpatient per general surgery.  Patient is advised to keep himself well-hydrated and continue taking current medications. He will follow-up with general surgery and his primary  care provider for further recommendations.  Assessment and Plan: * Acute appendicitis S/p appendectomy and a drain placement.  Found to have severe appendicitis and an abscess with contained perforation. -Continue with Zosyn-if remains stable can be discharged tomorrow on Cipro and Flagyl for total of 10 days. -Continue with supportive care  Diverticulosis of colon No evidence of acute diverticulitis on CT  Cigarette smoker Nicotine patch  Bipolar disorder (HCC) Take  Hypertension Continue hydralazine, metoprolol and amlodipine Hydralazine IV as needed .  Type 2 diabetes mellitus with diabetic neuropathy, unspecified (HCC) Sliding scale insulin coverage   Pain control - [place] Controlled Substance Reporting System database was reviewed. and patient was instructed, not to drive, operate heavy machinery, perform activities at heights, swimming or participation in water activities or provide baby-sitting services while on Pain, Sleep and Anxiety Medications; until their outpatient Physician has advised to do so again. Also recommended to not to take more than prescribed Pain, Sleep and Anxiety Medications.   Consultants: General surgery Procedures performed: Laparoscopic appendectomy with drain placement. Disposition: Home Diet recommendation:  Discharge Diet Orders (From admission, onward)     Start     Ordered   01/27/23 0000  Diet - low sodium heart healthy        01/27/23 1035           Cardiac and Carb modified diet DISCHARGE MEDICATION: Allergies as of 01/27/2023       Reactions   Lisinopril Other (See Comments)   Chest pain        Medication List     STOP taking these medications  amLODipine 10 MG tablet Commonly known as: NORVASC   ibuprofen 800 MG tablet Commonly known as: ADVIL       TAKE these medications    Accu-Chek Aviva Plus test strip Generic drug: glucose blood TEST TWICE DAILY   Accu-Chek Aviva Plus w/Device Kit 1 each  by Does not apply route daily.   Accu-Chek Softclix Lancets lancets 1 each by Other route daily. Use as instructed   albuterol 108 (90 Base) MCG/ACT inhaler Commonly known as: VENTOLIN HFA Inhale 2 puffs into the lungs every 6 (six) hours as needed for wheezing or shortness of breath. Rescue inhaler   amoxicillin-clavulanate 875-125 MG tablet Commonly known as: AUGMENTIN Take 1 tablet by mouth 2 (two) times daily for 7 days.   aspirin EC 81 MG tablet Take 1 tablet (81 mg total) by mouth daily.   atorvastatin 40 MG tablet Commonly known as: LIPITOR TAKE 1 TABLET BY MOUTH AT BEDTIME   dapagliflozin propanediol 10 MG Tabs tablet Commonly known as: Farxiga Take 1 tablet (10 mg total) by mouth daily. needs appointment for further refills   gabapentin 600 MG tablet Commonly known as: NEURONTIN Take 1 tablet (600 mg total) by mouth 3 (three) times daily as needed.   metoprolol succinate 25 MG 24 hr tablet Commonly known as: TOPROL-XL TAKE 1 TABLET BY MOUTH ONCE DAILY   oxyCODONE 5 MG immediate release tablet Commonly known as: Oxy IR/ROXICODONE Take 1 tablet (5 mg total) by mouth every 6 (six) hours as needed for severe pain or breakthrough pain.   Ozempic (0.25 or 0.5 MG/DOSE) 2 MG/3ML Sopn Generic drug: Semaglutide(0.25 or 0.5MG /DOS) INJECT 0.5MG  SUBCUTANEOUSLY ONCE A WEEK   polyethylene glycol powder 17 GM/SCOOP powder Commonly known as: GLYCOLAX/MIRALAX Take 17 g by mouth 2 (two) times daily as needed.   Ultracare Pen Needles 32G X 4 MM Misc Generic drug: Insulin Pen Needle USE 3 TIMES DAILY AS NEEDED   valsartan 80 MG tablet Commonly known as: DIOVAN Take 1 tablet (80 mg total) by mouth daily.        Follow-up Information     Donovan Kail, PA-C. Go on 02/03/2023.   Specialty: Physician Assistant Why: Go to appointment on 08/13 at 345 PM Contact information: [address] [place] [address] [phone]         Larae Grooms, NP.  Go on 02/02/2023.   Specialty: Nurse Practitioner Why: Go at  10:20am. Contact information: [address] [place] [address] [phone]                Discharge Exam: Ceasar Mons Weights   01/25/23 1527 01/25/23 2055 01/26/23 1023  Weight: 104.3 kg 98.4 kg 107.5 kg   General.  Well-developed gentleman, in no acute distress. Pulmonary.  Lungs clear bilaterally, normal respiratory effort. CV.  Regular rate and rhythm, no JVD, rub or murmur. Abdomen.  Soft, nontender, nondistended, BS positive.  LLQ drain in place CNS.  Alert and oriented .  No focal neurologic deficit. Extremities.  No edema, no cyanosis, pulses intact and symmetrical. Psychiatry.  Judgment and insight appears normal.   Condition at discharge: stable  The results of significant diagnostics from this hospitalization (including imaging, microbiology, ancillary and laboratory) are listed below for reference.   Imaging Studies: CT ABDOMEN PELVIS W CONTRAST  Result Date: 01/25/2023 CLINICAL DATA:  Acute abdominal pain, concern for diverticulitis. EXAM: CT ABDOMEN AND PELVIS WITH CONTRAST TECHNIQUE: Multidetector CT imaging of the abdomen and pelvis was performed using the standard  protocol following bolus administration of intravenous contrast. RADIATION DOSE REDUCTION: This exam was performed according to the departmental dose-optimization program which includes automated exposure control, adjustment of the mA and/or kV according to patient size and/or use of iterative reconstruction technique. CONTRAST:  OMNIPAQUE IOHEXOL 350 MG/ML SOLN COMPARISON:  CT abdomen and pelvis dated 03/03/2016. FINDINGS: Lower chest: There is mild bibasilar atelectasis. Hepatobiliary: No focal liver abnormality is seen. No gallstones, gallbladder wall thickening, or biliary dilatation. Pancreas: Unremarkable. No pancreatic ductal dilatation or surrounding inflammatory changes. Spleen: Normal in size without focal abnormality.  Adrenals/Urinary Tract: Adrenal glands are unremarkable. Kidneys are normal, without renal calculi, focal lesion, or hydronephrosis. Bladder is unremarkable. Stomach/Bowel: Stomach is within normal limits. There is diverticulosis of the descending and sigmoid colon with mild bowel wall thickening of the distal descending and proximal sigmoid colon. No significant associated inflammatory changes. There is an appendicolith at the base of the appendix with dilation of the appendix to 11 mm in diameter and moderate surrounding inflammatory changes. No evidence of abscess formation. No evidence of bowel obstruction. Vascular/Lymphatic: Aortic atherosclerosis. No enlarged abdominal or pelvic lymph nodes. Reproductive: The prostate is unremarkable. Bilateral hydroceles are noted. Other: There is moderate volume free intraperitoneal fluid. No abdominal wall hernia is identified. Musculoskeletal: Degenerative changes are seen in the spine. IMPRESSION: 1. Acute appendicitis.  No evidence of abscess. 2. Bowel wall thickening of the distal descending and proximal sigmoid colon in an area of diverticulosis is indeterminate, but is favored to reflect chronic changes of diverticulosis as opposed to acute diverticulitis. Aortic Atherosclerosis (ICD10-I70.0). Electronically Signed   By: Romona Curls M.D.   On: 01/25/2023 18:41    Microbiology: Results for orders placed or performed in visit on 07/09/21  Microscopic Examination     Status: Abnormal   Collection Time: 07/09/21 10:33 AM   Urine  Result Value Ref Range Status   WBC, UA None seen 0 - 5 /hpf Final   RBC, Urine None seen 0 - 2 /hpf Final   Epithelial Cells (non renal) None seen 0 - 10 /hpf Final   Mucus, UA Present (A) Not Estab. Final   Bacteria, UA None seen None seen/Few Final  Urine Culture     Status: None   Collection Time: 07/09/21 10:52 AM   Specimen: Urine   UR  Result Value Ref Range Status   Urine Culture, Routine Final report  Final    Organism ID, Bacteria No growth  Final    Labs: CBC: Recent Labs  Lab 01/25/23 1529 01/27/23 0355  WBC 6.7 9.6  HGB 16.0 12.3*  HCT 47.3 35.6*  MCV 95.9 95.4  PLT 277 226   Basic Metabolic Panel: Recent Labs  Lab 01/25/23 1529 01/27/23 0355  NA 139 136  K 3.8 4.0  CL 104 108  CO2 25 22  GLUCOSE 132* 115*  BUN 11 25*  CREATININE 1.08 1.46*  CALCIUM 9.0 7.3*   Liver Function Tests: Recent Labs  Lab 01/25/23 1529 01/27/23 0355  AST 32 20  ALT 40 28  ALKPHOS 65 43  BILITOT 0.4 0.6  PROT 8.0 6.0*  ALBUMIN 4.3 3.2*   CBG: Recent Labs  Lab 01/26/23 1220 01/26/23 1400 01/26/23 1717 01/26/23 2100 01/27/23 0807  GLUCAP 165* 151* 129* 142* 120*    Discharge time spent: greater than 30 minutes.  This record has been created using Conservation officer, historic buildings. Errors have been sought and corrected,but may not always be located. Such creation errors do  not reflect on the standard of care.   Signed: Arnetha Courser, MD [facility] 01/27/2023

## 2023-01-27 NOTE — Discharge Instructions (Addendum)
In addition to included general post-operative instructions,  Diet: Resume home diet.   Activity: No heavy lifting >20 pounds (children, pets, laundry, garbage) or strenuous activity 4 weeks, but light activity and walking are encouraged. Do not drive or drink alcohol if taking narcotic pain medications or having pain that might distract from driving.  Wound care: 2 days after surgery (08/07), you may shower/get incision wet with soapy water and pat dry (do not rub incisions), but no baths or submerging incision underwater until follow-up.   Medications: Resume all home medications. For mild to moderate pain: acetaminophen (Tylenol) or ibuprofen/naproxen (if no kidney disease). Combining Tylenol with alcohol can substantially increase your risk of causing liver disease. Narcotic pain medications, if prescribed, can be used for severe pain, though may cause nausea, constipation, and drowsiness. Do not combine Tylenol and Percocet (or similar) within a 6 hour period as Percocet (and similar) contain(s) Tylenol. If you do not need the narcotic pain medication, you do not need to fill the prescription.  Call office [phone] / [phone]) at any time if any questions, worsening pain, fevers/chills, bleeding, drainage from incision site, or other concerns.      Food Resources  Agency Name: [facility] Agency Address: [address] [place], [place] [address] Phone: [phone] Website: [email or web address] Service(s) Offered: Housing services, self-sufficiency, congregate meal program, weatherization program, Event organiser program, emergency food assistance,  housing counseling, home ownership program, wheels - to work program.  [employer] free for 60 and older at various locations from [employer], Monday-Friday:  [employer], [address] [place], [phone] -[facility], [address] [place] [phone]  -[facility], [address] [place] [phone]  -[address] [place], [phone]  Agency Name: [facility] Address: [phone], [place], [place] [address] Phone: [phone] Website: [email or web address] Service(s) Offered: Home delivered hot, frozen, and emergency  meals. Grocery assistance program which matches  volunteers one-on-one with seniors unable to grocery shop  for themselves. Must be 60 years and older; less than 20  hours of in-home aide service, limited or no driving ability;  live alone or with someone with a disability; live in  [place].  Agency Name: [employer] [facility] [employer]) Address: [address], [place], [place] [address] Phone: [phone] Service(s) Offered: Food is served to shut-ins, homeless, elderly, and low income people in the community every Saturday (11:30 am-12:30 pm) and Sunday (12:30 pm-1:30pm). Volunteers also offer help and encouragement in seeking employment,  and spiritual guidance.  Agency Name: Department [employer] Social Services Address: [address] [place], [place] [address] Phone: [phone] Service(s) Offered: Child support services; child welfare services; food stamps; Medicaid; work first family assistance; and aid with fuel,  rent, food and medicine.  Agency Name: Dietitian Address: [address], [place], [place] Phone: [phone] Website: [email or web address] Services Offered: Monday 10:00am-12:00, 8:00pm-9:00pm, and Friday 10:00am-12:00.  Agency Name: [employer] Address: [address] [place], [place] [address] Phone: [phone] Website: [email or web address] Service(s) Offered: Serves weekday meals, open from 11:30 am- 1:00 pm., and 6:30-7:30pm, Monday-Wednesday-Friday distributes food 3:30-6pm, Monday-Wednesday-Friday.  Agency Name: [facility] Address: [address], [place], [place] Phone: [phone] Website: [email or web address] Services Offered: Distributes food the 4th Saturday of the month, starting at 8:00 am  Agency Name: [facility] Address: [phone] [address] [place], [place] [address] Phone: [phone] Website: [email or web address] Service(s) Offered: [employer] life, weekly  food pantry. Open Wednesdays from 10:00am-noon.  Agency Name: [facility] [employer] Address: [address] [place], [place], [place] Phone: [phone] Services Offered: Distributes food 9am-1pm, Monday-Thursday. Call for details.  Agency Name: [facility] Address: [address] [place], [place] [address] Phone: [phone] Website: [email or web address] Service(s) Offered: Games developer. Call for assistance.  Agency Name: [facility] Address: [address], [place], [place] [address] Phone: [phone] Service Offered: Emergency Food Pantry. Call for appointment.  Agency Name: [facility] Address: [address] [place], [place] [address] Phone: [phone] Website: [email or web address] Services Offered: Games developer. Call for details  Agency Name: [facility] Address: [address] [place], [place] Phone: [phone] Website: [email or web address] Service(s) Offered: Emergency Food Pantry. Call for details.  Agency Name: Holiday representative Address: [address], [place], [place] [address] Phone: [phone] or [phone] Website: [email or web address] Service(s) Offered: Distribute food 9am-11:30 am, Tuesday-Friday, and 1-3:30pm, Monday-Friday. Food pantry Monday-Friday 1pm-3pm, fresh items, Mon.-Wed.-Fri.  Agency Name: [facility] Address: [address] [place], [place] [address] Phone: [phone] Website: [email or web address] Services Offered: Distribute food Tues and Sats from  9:00am-noon. Closed 1st Saturday of each month. Call for details

## 2023-01-27 NOTE — Progress Notes (Signed)
[facility] SURGICAL PROGRESS NOTE  Hospital Day(s): 2.   Post op day(s): 1 Day Post-Op.   Interval History:  Patient seen and examined No acute events or new complaints overnight.  Patient reports he is feeling better; anxious to go home Abdominal soreness No fever, chills, nausea, emesis WBC 9.6K Hgb 12.3 AKI with sCr - 1.46; UO - unmeasured Drain 315 ccs; serosanguinous On regular diet; tolerating Zosyn    Vital signs in last 24 hours: [min-max] current  Temp:  [97.6 F (36.4 C)-98.7 F (37.1 C)] 98.5 F (36.9 C) (08/06 0320) Pulse Rate:  [60-78] 63 (08/06 0320) Resp:  [13-23] 18 (08/06 0320) BP: (109-132)/(70-88) 109/74 (08/06 0320) SpO2:  [95 %-100 %] 100 % (08/06 0320) Weight:  [107.5 kg] 107.5 kg (08/05 1023)     Height: 6' (182.9 cm) Weight: 107.5 kg BMI (Calculated): 32.14   Intake/Output last 2 shifts:  08/05 0701 - 08/06 0700 In: 1858.3 [P.O.:120; I.V.:1500; IV Piggyback:238.3] Out: 325 [Drains:315; Blood:10]   Physical Exam:  Constitutional: alert, cooperative and no distress  Respiratory: breathing non-labored at rest  Cardiovascular: regular rate and sinus rhythm  Gastrointestinal: soft, non-tender, and non-distended. No rebound/guarding. Surgical drain with serosanguinous output Integumentary: Laparoscopic incisions are CDI with dermabond, no erythema or drainage  Labs:     Latest Ref Rng & Units 01/27/2023    3:55 AM 01/25/2023    3:29 PM 04/15/2022    1:27 PM  CBC  WBC 4.0 - 10.5 K/uL 9.6  6.7  4.2   Hemoglobin 13.0 - 17.0 g/dL 09.8  11.9  14.7   Hematocrit 39.0 - 52.0 % 35.6  47.3  43.9   Platelets 150 - 400 K/uL 226  277  332       Latest Ref Rng & Units 01/27/2023    3:55 AM 01/25/2023    3:29 PM 10/01/2022   11:02 AM  CMP  Glucose 70 - 99 mg/dL 829  562  130   BUN 6 - 20 mg/dL 25  11  16    Creatinine 0.61 - 1.24 mg/dL 8.65  7.84  6.96   Sodium 135 - 145 mmol/L 136  139  140   Potassium 3.5 - 5.1 mmol/L 4.0  3.8  3.9    Chloride 98 - 111 mmol/L 108  104  107   CO2 22 - 32 mmol/L 22  25  19    Calcium 8.9 - 10.3 mg/dL 7.3  9.0  8.9   Total Protein 6.5 - 8.1 g/dL 6.0  8.0  6.5   Total Bilirubin 0.3 - 1.2 mg/dL 0.6  0.4  <2.9   Alkaline Phos 38 - 126 U/L 43  65  73   AST 15 - 41 U/L 20  32  27   ALT 0 - 44 U/L 28  40  40      Imaging studies: No new pertinent imaging studies   Assessment/Plan:  57 y.o. male with AKI, otherwise doing well, 1 Day Post-Op s/p laparoscopic appendectomy for acute appendicitis.   - Okay to continue diet as tolerated  - Will give NS bolus given AKI; likely under resuscitation from surgery  - Continue IV Abx (Zosyn); Augmentin PO x7 days for home  - Continue surgical drain; monitor and record output - he will go home with this  - Monitor abdominal examination  - Pain control prn; antiemetics prn - Mobilize   - Further management per primary service    - Discharge Planning: Will give NS  bolus, otherwise okay for DC from surgical standpoint. I will send Rx for Abx and pain control. Will DC with drain. I will see him in 1 week for drain removal and recheck BMP.    All of the above findings and recommendations were discussed with the patient, and the medical team, and all of patient's questions were answered to his expressed satisfaction.  -- Lynden Oxford, PA-C [facility] 01/27/2023, 7:16 AM M-F: 7am - 4pm

## 2023-01-27 NOTE — Plan of Care (Signed)
  Problem: Education: Goal: Ability to describe self-care measures that may prevent or decrease complications (Diabetes Survival Skills Education) will improve Outcome: Adequate for Discharge Goal: Individualized Educational Video(s) Outcome: Adequate for Discharge   Problem: Coping: Goal: Ability to adjust to condition or change in health will improve Outcome: Adequate for Discharge   Problem: Fluid Volume: Goal: Ability to maintain a balanced intake and output will improve Outcome: Adequate for Discharge   Problem: Health Behavior/Discharge Planning: Goal: Ability to identify and utilize available resources and services will improve Outcome: Adequate for Discharge Goal: Ability to manage health-related needs will improve Outcome: Adequate for Discharge   Problem: Metabolic: Goal: Ability to maintain appropriate glucose levels will improve Outcome: Adequate for Discharge   Problem: Nutritional: Goal: Maintenance of adequate nutrition will improve Outcome: Adequate for Discharge Goal: Progress toward achieving an optimal weight will improve Outcome: Adequate for Discharge   Problem: Skin Integrity: Goal: Risk for impaired skin integrity will decrease Outcome: Adequate for Discharge   Problem: Tissue Perfusion: Goal: Adequacy of tissue perfusion will improve Outcome: Adequate for Discharge   Problem: Education: Goal: Knowledge of General Education information will improve Description: Including pain rating scale, medication(s)/side effects and non-pharmacologic comfort measures Outcome: Adequate for Discharge   Problem: Health Behavior/Discharge Planning: Goal: Ability to manage health-related needs will improve Outcome: Adequate for Discharge   Problem: Clinical Measurements: Goal: Ability to maintain clinical measurements within normal limits will improve Outcome: Adequate for Discharge Goal: Will remain free from infection Outcome: Adequate for Discharge Goal:  Diagnostic test results will improve Outcome: Adequate for Discharge Goal: Respiratory complications will improve Outcome: Adequate for Discharge Goal: Cardiovascular complication will be avoided Outcome: Adequate for Discharge   Problem: Activity: Goal: Risk for activity intolerance will decrease Outcome: Adequate for Discharge   Problem: Nutrition: Goal: Adequate nutrition will be maintained Outcome: Adequate for Discharge   Problem: Coping: Goal: Level of anxiety will decrease Outcome: Adequate for Discharge   Problem: Elimination: Goal: Will not experience complications related to bowel motility Outcome: Adequate for Discharge Goal: Will not experience complications related to urinary retention Outcome: Adequate for Discharge   Problem: Pain Managment: Goal: General experience of comfort will improve Outcome: Adequate for Discharge   Problem: Safety: Goal: Ability to remain free from injury will improve Outcome: Adequate for Discharge   Problem: Skin Integrity: Goal: Risk for impaired skin integrity will decrease Outcome: Adequate for Discharge    Pt ao x4, respirations even and unlabored on RA. Pt has all belongings. Pt has received all DC instructions. Pt being taken home by family. Pt taken down to lobby with staff via wheelchair

## 2023-01-27 NOTE — TOC CM/SW Note (Signed)
Transition of Care [facility]) - Inpatient Brief Assessment   Patient Details  Name: Lance Vaughan MRN: [id number] Date of Birth: April 05, 1966  Transition of Care [facility]) CM/SW Contact:    Margarito Liner, LCSW Phone Number: 01/27/2023, 9:01 AM   Clinical Narrative: CSW reviewed chart. SDOH flag for food insecurity. Resources added to AVS. No other TOC needs identified. CSW will continue to follow progress.  Transition of Care Asessment: Insurance and Status: Insurance coverage has been reviewed Patient has primary care physician: Yes Home environment has been reviewed: Apartment Prior level of function:: Not documented Prior/Current Home Services: No current home services Social Determinants of Health Reivew: SDOH reviewed interventions complete Readmission risk has been reviewed: Yes Transition of care needs: no transition of care needs at this time

## 2023-01-27 NOTE — Plan of Care (Signed)

## 2023-01-28 ENCOUNTER — Telehealth: Payer: Self-pay

## 2023-01-28 NOTE — Transitions of Care (Post Inpatient/ED Visit) (Signed)
01/28/2023  Name: Lance Vaughan MRN: [id number] DOB: 1965/08/02  Today's TOC FU Call Status: Today's TOC FU Call Status:: Successful TOC FU Call Completed Unsuccessful Call (1st Attempt) Date: 01/28/23 [facility] FU Call Complete Date: 01/28/23  Transition Care Management Follow-up Telephone Call Date of Discharge: 01/27/23 Discharge Facility: [facility]) Type of Discharge: Inpatient Admission Primary Inpatient Discharge Diagnosis:: appendicitis How have you been since you were released from the hospital?: Better Any questions or concerns?: No  Items Reviewed: Did you receive and understand the discharge instructions provided?: Yes Medications obtained,verified, and reconciled?: Yes (Medications Reviewed) Any new allergies since your discharge?: No Dietary orders reviewed?: Yes Do you have support at home?: Yes People in Home: spouse  Medications Reviewed Today: Medications Reviewed Today     Reviewed by Karena Addison, LPN (Licensed Practical Nurse) on 01/28/23 at [phone]  Med List Status: <None>   Medication Order Taking? Sig Documenting Provider Last Dose Status Informant  Accu-Chek Softclix Lancets lancets [id number] No 1 each by Other route daily. Use as instructed Larae Grooms, NP Taking Active   albuterol (VENTOLIN HFA) 108 (90 Base) MCG/ACT inhaler [id number] No Inhale 2 puffs into the lungs every 6 (six) hours as needed for wheezing or shortness of breath. Rescue inhaler Larae Grooms, NP prn unk Active   amoxicillin-clavulanate (AUGMENTIN) 875-125 MG tablet [id number]  Take 1 tablet by mouth 2 (two) times daily for 7 days. Donovan Kail, PA-C  Active   aspirin EC 81 MG tablet [id number] No Take 1 tablet (81 mg total) by mouth daily. Andreas Ohm, NP Taking Active Self  atorvastatin (LIPITOR) 40 MG tablet [id number] No TAKE 1 TABLET BY MOUTH AT BEDTIME Cannady, Jolene T, NP Taking Active   Blood Glucose Monitoring Suppl (ACCU-CHEK  AVIVA PLUS) w/Device KIT [id number] No 1 each by Does not apply route daily. Larae Grooms, NP Taking Active   dapagliflozin propanediol (FARXIGA) 10 MG TABS tablet [id number] No Take 1 tablet (10 mg total) by mouth daily. needs appointment for further refills Larae Grooms, NP 01/26/2023 Active   gabapentin (NEURONTIN) 600 MG tablet [id number] No Take 1 tablet (600 mg total) by mouth 3 (three) times daily as needed. Larae Grooms, NP Taking Active   glucose blood (ACCU-CHEK AVIVA PLUS) test strip [id number] No TEST TWICE DAILY Larae Grooms, NP Taking Active   metoprolol succinate (TOPROL-XL) 25 MG 24 hr tablet [id number] No TAKE 1 TABLET BY MOUTH ONCE DAILY Olevia Perches P, DO 01/26/2023 0900 Active   oxyCODONE (OXY IR/ROXICODONE) 5 MG immediate release tablet [id number]  Take 1 tablet (5 mg total) by mouth every 6 (six) hours as needed for severe pain or breakthrough pain. Donovan Kail, PA-C  Active   OZEMPIC, 0.25 OR 0.5 MG/DOSE, 2 MG/3ML Namon Cirri [id number] No INJECT 0.5MG  SUBCUTANEOUSLY ONCE A WEEK Larae Grooms, NP Past Week Active   polyethylene glycol powder (GLYCOLAX/MIRALAX) 17 GM/SCOOP powder [id number] No Take 17 g by mouth 2 (two) times daily as needed. Loura Pardon, MD Taking Active   ULTRACARE PEN NEEDLES 32G X 4 MM MISC [id number] No USE 3 TIMES DAILY AS NEEDED Larae Grooms, NP Taking Active   valsartan (DIOVAN) 80 MG tablet [id number] No Take 1 tablet (80 mg total) by mouth daily. Larae Grooms, NP Taking Active             Home Care and Equipment/Supplies: Were Home Health Services Ordered?: NA Any new equipment or medical supplies ordered?: NA  Functional Questionnaire: Do you need assistance with  bathing/showering or dressing?: No Do you need assistance with meal preparation?: No Do you need assistance with eating?: No Do you have difficulty maintaining continence: No Do you need assistance with getting out of bed/getting out of a chair/moving?: No Do  you have difficulty managing or taking your medications?: No  Follow up appointments reviewed: PCP Follow-up appointment confirmed?: Yes Date of PCP follow-up appointment?: 02/02/23 Follow-up Provider: [facility] Follow-up appointment confirmed?: Yes Date of Specialist follow-up appointment?: 02/03/23 Follow-Up Specialty Provider:: surgeon Do you need transportation to your follow-up appointment?: No Do you understand care options if your condition(s) worsen?: Yes-patient verbalized understanding    SIGNATURE Karena Addison, LPN [facility] Nurse Health Advisor Direct Dial [phone]

## 2023-01-28 NOTE — Transitions of Care (Post Inpatient/ED Visit) (Signed)
   01/28/2023  Name: Lance Vaughan MRN: [id number] DOB: [birth year]  Today's TOC FU Call Status: Today's TOC FU Call Status:: Unsuccessful Call (1st Attempt) Unsuccessful Call (1st Attempt) Date: 01/28/23  Attempted to reach the patient regarding the most recent Inpatient/ED visit.  Follow Up Plan: Additional outreach attempts will be made to reach the patient to complete the Transitions of Care (Post Inpatient/ED visit) call.   Signature Karena Addison, LPN [facility] Nurse Health Advisor Direct Dial [phone]

## 2023-02-02 ENCOUNTER — Ambulatory Visit: Payer: MEDICAID | Admitting: Family Medicine

## 2023-02-02 ENCOUNTER — Encounter: Payer: Self-pay | Admitting: Family Medicine

## 2023-02-02 ENCOUNTER — Ambulatory Visit (INDEPENDENT_AMBULATORY_CARE_PROVIDER_SITE_OTHER): Payer: MEDICAID | Admitting: Surgery

## 2023-02-02 ENCOUNTER — Encounter: Payer: Self-pay | Admitting: Surgery

## 2023-02-02 ENCOUNTER — Encounter: Payer: MEDICAID | Admitting: Surgery

## 2023-02-02 VITALS — BP 128/75 | HR 59 | Temp 98.7°F | Wt 222.4 lb

## 2023-02-02 VITALS — BP 137/84 | HR 70 | Temp 97.8°F | Wt 222.0 lb

## 2023-02-02 DIAGNOSIS — Z9049 Acquired absence of other specified parts of digestive tract: Secondary | ICD-10-CM

## 2023-02-02 DIAGNOSIS — Z09 Encounter for follow-up examination after completed treatment for conditions other than malignant neoplasm: Secondary | ICD-10-CM

## 2023-02-02 DIAGNOSIS — K3533 Acute appendicitis with perforation and localized peritonitis, with abscess: Secondary | ICD-10-CM

## 2023-02-02 DIAGNOSIS — Z7984 Long term (current) use of oral hypoglycemic drugs: Secondary | ICD-10-CM

## 2023-02-02 NOTE — Patient Instructions (Signed)
Use Ice pack as needed for pain.   If you have any concerns or questions, please feel free to call our office.   Laparoscopic Appendectomy, Adult, Care After What can I expect after the procedure? After a laparoscopic appendectomy, it is common to have: Little energy for normal activities. Mild pain in the area where the cuts from surgery (incisions) were made. Trouble pooping (constipation). This may be caused by: Pain medicine. A lack of activity. Gas pain that can spread to your shoulders. Follow these instructions at home: Your doctor may give you more instructions. If you have problems, contact your doctor. Medicines Take over-the-counter and prescription medicines only as told by your doctor. Do not stop taking antibiotics even if you start to feel better. Take pain medicines before your pain gets very bad. If told, take steps to prevent problems with pooping (constipation). You may need to: Drink enough fluid to keep your pee (urine) pale yellow. Take medicines. You will be told what medicines to take. Eat foods that are high in fiber. These include beans, whole grains, and fresh fruits and vegetables. Limit foods that are high in fat and sugar. These include fried or sweet foods. Ask your doctor if you should avoid driving or using machines while you are taking your medicine. Incision care  Follow instructions from your doctor about how to take care of your incisions. Make sure you: Wash your hands with soap and water for at least 20 seconds before and after you change your bandage. If you cannot use soap and water, use hand sanitizer. Change your bandage. Leave stitches, staples, or skin glue in place for at least 2 weeks. Leave tape strips alone unless you are told to take them off. You may trim the edges of the tape strips if they curl up. Check your incisions every day for signs of infection. Check for: Redness, swelling, or pain. Fluid or blood. Warmth. Pus or a bad  smell. Bathing Do not take baths, swim, or use a hot tub. Ask your doctor about taking showers or sponge baths. Keep your incisions clean and dry. Clean them as told by your doctor. To do this: Gently wash the incisions with soap and water. Rinse the incisions with water to get all the soap off. Pat the incisions dry with a clean towel. Do not rub the incisions. Activity  If you were given a sedative during your procedure, do not drive or use machines until your doctor says that it is safe. A sedative is a medicine that helps you relax. Rest as told by your doctor. Do not lift anything that is heavier than 10 lb (4.5 kg). Do not play contact sports until your doctor says that it is safe. Return to your normal activities when your doctor says that it is safe. General instructions If you were sent home with a drain, follow instructions from your doctor on how to care for it. Take deep breaths. This helps to keep your lungs from getting an infection (pneumonia). If you need to cough or sneeze, place a pillow or blanket on your belly before you do so. This will help you control the pain. Keep all follow-up visits. Contact a doctor if: You have any of these signs of infection in an incision: Redness, swelling, or pain. Fluid or blood. Warmth. Pus or a bad smell. Your incisions open after the stitches have been taken out. You lose your appetite. You feel as if you may vomit. You vomit. You get watery  poop (diarrhea), or you cannot control your poop. You get a rash. Get help right away if: You have a fever. You have trouble breathing. You have sharp pains in your chest. You have swelling or pain in your legs. You faint. These symptoms may be an emergency. Get help right away. Call 911. Do not wait to see if the symptoms will go away. Do not drive yourself to the hospital. Summary After the procedure, it is common to have low energy, mild pain, trouble pooping, and gas pain. Follow  your doctor's instructions about caring for yourself after the procedure. Rest after the procedure. Return to your normal activities as told by your doctor. Contact your doctor if you see signs of infection around your incisions. Get help right away if you have a fever, chest pain, or trouble breathing. This information is not intended to replace advice given to you by your health care provider. Make sure you discuss any questions you have with your health care provider. Document Revised: 03/21/2021 Document Reviewed: 03/21/2021 Elsevier Patient Education  2024 [employer].

## 2023-02-02 NOTE — Progress Notes (Signed)
BP 128/75   Pulse (!) 59   Temp 98.7 F (37.1 C) (Oral)   Wt 222 lb 6.4 oz (100.9 kg)   SpO2 97%   BMI 30.16 kg/m    Subjective:    Patient ID: Lance Vaughan, male    DOB: [birth year], 57 y.o.   MRN: [id number]  HPI: Lance Vaughan is a 57 y.o. male  Chief Complaint  Patient presents with   Hospitalization Follow-up    Pt states he was in the hospital last week for appendicitis, had appendix removed 01/26/23. Patient states he is still in a lot of pain, requesting refill on antibiotic and Oxycodone. States his urine has been pink/red in color since being at the hospital.    HOSPITAL FOLLOW UP  Time since discharge: 01/27/2023; 6 days  Hospital/facility:  [facility] Diagnosis: Acute Appendicitis Procedures/tests: Appendectomy with drain placement to LLQ, to be removed 02/02/2023. Consultants: None New medications: Stopped Amlodipine and Ibuprofen, started Augmentin for 7 days and Oxycodone PRN. Discharge instructions:  Take Augmentin BID for 7 days and Oxycodone 5 mg every 6 hours PRN, recommend staying hydrated, and heart healthy low sodium diet. Follow up with general surgery in 1 week.  He complains of pain at the drain site radiating from (LLQ) to right lower quadrant, and up to his right upper quadrant. He describes the pain as constant; aching, 10/10. The oxycodone provides relief for 7-8 hours, pt finished this dose yesterday. He has not tried any other medications for pain. He denies nausea, vomitting, constipation, diarrhea, shortness of breath, and chest pain.  He admits to staying hydrated, with 2L of water intake daily and limiting his salt intake.    Drain: Drain amount has improved, at discharge drain required emptying BID to now once daily. Drain output changed from bright red, pink, to serous.  Status: Patient feels his abdominal pain is worsening  Relevant past medical, surgical, family and social history reviewed and updated as indicated. Interim medical history since our last  visit reviewed. Allergies and medications reviewed and updated.  Review of Systems  Constitutional:  Negative for chills and fever.  Respiratory: Negative.    Cardiovascular: Negative.   Gastrointestinal:  Positive for abdominal pain. Negative for abdominal distention, constipation, diarrhea, nausea and vomiting.  Skin:        Drain in [facility] at [facility]    Per HPI unless specifically indicated above     Objective:    BP 128/75   Pulse (!) 59   Temp 98.7 F (37.1 C) (Oral)   Wt 222 lb 6.4 oz (100.9 kg)   SpO2 97%   BMI 30.16 kg/m   Wt Readings from Last 3 Encounters:  02/02/23 222 lb (100.7 kg)  02/02/23 222 lb 6.4 oz (100.9 kg)  01/26/23 237 lb (107.5 kg)    Physical Exam Vitals and nursing note reviewed.  Constitutional:      General: He is awake. He is not in acute distress.    Appearance: Normal appearance. He is well-developed and well-groomed. He is not ill-appearing.  HENT:     Head: Normocephalic and atraumatic.     Right Ear: Hearing and external ear normal. No drainage.     Left Ear: Hearing and external ear normal. No drainage.     Nose: Nose normal.  Eyes:     General: Lids are normal.        Right eye: No discharge.        Left eye: No discharge.  Conjunctiva/sclera: Conjunctivae normal.  Cardiovascular:     Rate and Rhythm: Regular rhythm. Bradycardia present.     Pulses:          Radial pulses are 2+ on the right side and 2+ on the left side.       Posterior tibial pulses are 2+ on the right side and 2+ on the left side.     Heart sounds: Normal heart sounds, S1 normal and S2 normal. No murmur heard.    No gallop.  Pulmonary:     Effort: Pulmonary effort is normal. No accessory muscle usage or respiratory distress.     Breath sounds: Normal breath sounds.  Abdominal:     General: Bowel sounds are decreased. There is no distension.     Palpations: Abdomen is soft.     Tenderness: There is abdominal tenderness in the right upper quadrant, right  lower quadrant and left lower quadrant.    Musculoskeletal:        General: Normal range of motion.     Cervical back: Full passive range of motion without pain and normal range of motion.     Right lower leg: No edema.     Left lower leg: No edema.  Skin:    General: Skin is warm and dry.     Capillary Refill: Capillary refill takes less than 2 seconds.  Neurological:     Mental Status: He is alert and oriented to person, place, and time.  Psychiatric:        Attention and Perception: Attention normal.        Mood and Affect: Mood normal.        Speech: Speech normal.        Behavior: Behavior normal. Behavior is cooperative.        Thought Content: Thought content normal.     Results for orders placed or performed during the hospital encounter of 01/25/23  Lipase, blood  Result Value Ref Range   Lipase 29 11 - 51 U/L  Comprehensive metabolic panel  Result Value Ref Range   Sodium 139 135 - 145 mmol/L   Potassium 3.8 3.5 - 5.1 mmol/L   Chloride 104 98 - 111 mmol/L   CO2 25 22 - 32 mmol/L   Glucose, Bld 132 (H) 70 - 99 mg/dL   BUN 11 6 - 20 mg/dL   Creatinine, Ser 9.14 0.61 - 1.24 mg/dL   Calcium 9.0 8.9 - 78.2 mg/dL   Total Protein 8.0 6.5 - 8.1 g/dL   Albumin 4.3 3.5 - 5.0 g/dL   AST 32 15 - 41 U/L   ALT 40 0 - 44 U/L   Alkaline Phosphatase 65 38 - 126 U/L   Total Bilirubin 0.4 0.3 - 1.2 mg/dL   GFR, Estimated >95 >62 mL/min   Anion gap 10 5 - 15  CBC  Result Value Ref Range   WBC 6.7 4.0 - 10.5 K/uL   RBC 4.93 4.22 - 5.81 MIL/uL   Hemoglobin 16.0 13.0 - 17.0 g/dL   HCT 13.0 86.5 - 78.4 %   MCV 95.9 80.0 - 100.0 fL   MCH 32.5 26.0 - 34.0 pg   MCHC 33.8 30.0 - 36.0 g/dL   RDW 69.6 29.5 - 28.4 %   Platelets 277 150 - 400 K/uL   nRBC 0.0 0.0 - 0.2 %  Urinalysis, Routine w reflex microscopic -Urine, Clean Catch  Result Value Ref Range   Color, Urine STRAW (A) YELLOW   APPearance CLEAR (  A) CLEAR   Specific Gravity, Urine >1.046 (H) 1.005 - 1.030   pH 6.0 5.0  - 8.0   Glucose, UA NEGATIVE NEGATIVE mg/dL   Hgb urine dipstick NEGATIVE NEGATIVE   Bilirubin Urine NEGATIVE NEGATIVE   Ketones, ur NEGATIVE NEGATIVE mg/dL   Protein, ur NEGATIVE NEGATIVE mg/dL   Nitrite NEGATIVE NEGATIVE   Leukocytes,Ua NEGATIVE NEGATIVE  Hemoglobin A1c  Result Value Ref Range   Hgb A1c MFr Bld 5.8 (H) 4.8 - 5.6 %   Mean Plasma Glucose 119.76 mg/dL  HIV Antibody (routine testing w rflx)  Result Value Ref Range   HIV Screen 4th Generation wRfx Non Reactive Non Reactive  Glucose, capillary  Result Value Ref Range   Glucose-Capillary 102 (H) 70 - 99 mg/dL  Glucose, capillary  Result Value Ref Range   Glucose-Capillary 113 (H) 70 - 99 mg/dL  Glucose, capillary  Result Value Ref Range   Glucose-Capillary 104 (H) 70 - 99 mg/dL  Glucose, capillary  Result Value Ref Range   Glucose-Capillary 165 (H) 70 - 99 mg/dL  Glucose, capillary  Result Value Ref Range   Glucose-Capillary 151 (H) 70 - 99 mg/dL  CBC  Result Value Ref Range   WBC 9.6 4.0 - 10.5 K/uL   RBC 3.73 (L) 4.22 - 5.81 MIL/uL   Hemoglobin 12.3 (L) 13.0 - 17.0 g/dL   HCT 95.2 (L) 84.1 - 32.4 %   MCV 95.4 80.0 - 100.0 fL   MCH 33.0 26.0 - 34.0 pg   MCHC 34.6 30.0 - 36.0 g/dL   RDW 40.1 02.7 - 25.3 %   Platelets 226 150 - 400 K/uL   nRBC 0.0 0.0 - 0.2 %  Comprehensive metabolic panel  Result Value Ref Range   Sodium 136 135 - 145 mmol/L   Potassium 4.0 3.5 - 5.1 mmol/L   Chloride 108 98 - 111 mmol/L   CO2 22 22 - 32 mmol/L   Glucose, Bld 115 (H) 70 - 99 mg/dL   BUN 25 (H) 6 - 20 mg/dL   Creatinine, Ser 6.64 (H) 0.61 - 1.24 mg/dL   Calcium 7.3 (L) 8.9 - 10.3 mg/dL   Total Protein 6.0 (L) 6.5 - 8.1 g/dL   Albumin 3.2 (L) 3.5 - 5.0 g/dL   AST 20 15 - 41 U/L   ALT 28 0 - 44 U/L   Alkaline Phosphatase 43 38 - 126 U/L   Total Bilirubin 0.6 0.3 - 1.2 mg/dL   GFR, Estimated 56 (L) >60 mL/min   Anion gap 6 5 - 15  Glucose, capillary  Result Value Ref Range   Glucose-Capillary 129 (H) 70 - 99 mg/dL   Glucose, capillary  Result Value Ref Range   Glucose-Capillary 142 (H) 70 - 99 mg/dL  Glucose, capillary  Result Value Ref Range   Glucose-Capillary 120 (H) 70 - 99 mg/dL  Glucose, capillary  Result Value Ref Range   Glucose-Capillary 102 (H) 70 - 99 mg/dL      Assessment & Plan:   Problem List Items Addressed This Visit   None Visit Diagnoses     S/P laparoscopic appendectomy    -  Primary   Acute. Complains of abdominal pain in RLQ up to RUQ. Scheduled urgent appt today with gen surgery follow up today instead of tomorrow.        Follow up plan: Return in about 2 months (around 04/04/2023) for Follow up HTN/HLD, Mood, and DM2.

## 2023-02-02 NOTE — Progress Notes (Signed)
Outpatient Surgical Follow Up  02/02/2023  Lance Vaughan is an 57 y.o. male.   Chief Complaint  Patient presents with   Routine Post Op    Lap appy with drain 01/26/23    HPI: Lance Vaughan is a week out from laparoscopic appendectomy.  Left a drain due to severe inflammatory response.  About 10 to 20 cc a day of serous fluid.  No fevers no chills.  Still have some abdominal pain but is slowly improving.  Tolerating diet well.  Having bowel movements.  No fevers  Past Medical History:  Diagnosis Date   Arthritis    Bipolar disorder (HCC)    Bursitis    Depression    Diabetes mellitus without complication (HCC)    Diverticulitis    Hyperlipidemia    Hypertension    Psychosis (HCC)    Psychosis (HCC)     Past Surgical History:  Procedure Laterality Date   COLONOSCOPY WITH PROPOFOL N/A 05/03/2018   Procedure: COLONOSCOPY WITH PROPOFOL;  Surgeon: Toney Reil, MD;  Location: [facility] ENDOSCOPY;  Service: Gastroenterology;  Laterality: N/A;   HEMORRHOID SURGERY N/A 07/06/2019   Procedure: HEMORRHOIDECTOMY;  Surgeon: [name], MD;  Location: [facility] ORS;  Service: General;  Laterality: N/A;   LAPAROSCOPIC APPENDECTOMY N/A 01/26/2023   Procedure: APPENDECTOMY LAPAROSCOPIC;  Surgeon: Leafy Ro, MD;  Location: [facility] ORS;  Service: General;  Laterality: N/A;   RECTAL EXAM UNDER ANESTHESIA N/A 07/06/2019   Procedure: RECTAL EXAM UNDER ANESTHESIA;  Surgeon: [name], MD;  Location: [facility] ORS;  Service: General;  Laterality: N/A;    Family History  Problem Relation Age of Onset   Hypertension Mother    Hypertension Sister    Hyperlipidemia Sister    Hyperlipidemia Sister    Hypertension Sister    Diabetes Maternal Grandmother     Social History:  reports that he has been smoking cigarettes. He has a 60 pack-year smoking history. He has never used smokeless tobacco. He reports that he does not currently use alcohol. He reports that he does not use drugs.  Allergies:   Allergies  Allergen Reactions   Lisinopril Other (See Comments)    Chest pain    Medications reviewed.    ROS Full ROS performed and is otherwise negative other than what is stated in HPI   BP 137/84   Pulse 70   Temp 97.8 F (36.6 C) (Oral)   Wt 222 lb (100.7 kg)   SpO2 98%   BMI 30.11 kg/m   Physical Exam  NAD alert Abd: soft, some appropriate incisional tenderness w/o peritonitis, drain removed ( serous)  Assessment/Plan: Doing Very well after severe appendicitis.  No surgical issues at this time. RTC as needed  Sterling Big, MD [facility] General Surgeon

## 2023-02-03 ENCOUNTER — Encounter: Payer: MEDICAID | Admitting: Physician Assistant

## 2023-02-06 ENCOUNTER — Telehealth: Payer: Self-pay | Admitting: Nurse Practitioner

## 2023-02-06 ENCOUNTER — Other Ambulatory Visit: Payer: Self-pay | Admitting: Nurse Practitioner

## 2023-02-06 NOTE — Telephone Encounter (Signed)
Requested Prescriptions  Pending Prescriptions Disp Refills   Semaglutide,0.25 or 0.5MG /DOS, (OZEMPIC, 0.25 OR 0.5 MG/DOSE,) 2 MG/3ML SOPN [Pharmacy Med Name: OZEMPIC (0.25 OR 0.5 MG/DOSE) 2 MG/] 12 mL 0    Sig: INJECT 0.5MG  SUBCUTANEOUSLY ONCE A WEEK     Endocrinology:  Diabetes - GLP-1 Receptor Agonists - semaglutide Failed - 02/06/2023  3:58 PM      Failed - HBA1C in normal range and within 180 days    HB A1C (BAYER DCA - WAIVED)  Date Value Ref Range Status  10/08/2021 5.2 4.8 - 5.6 % Final    Comment:             Prediabetes: 5.7 - 6.4          Diabetes: >6.4          Glycemic control for adults with diabetes: <7.0    Hgb A1c MFr Bld  Date Value Ref Range Status  01/26/2023 5.8 (H) 4.8 - 5.6 % Final    Comment:    (NOTE) Pre diabetes:          5.7%-6.4%  Diabetes:              >6.4%  Glycemic control for   <7.0% adults with diabetes          Failed - Cr in normal range and within 360 days    Creatinine, Ser  Date Value Ref Range Status  01/27/2023 1.46 (H) 0.61 - 1.24 mg/dL Final         Passed - Valid encounter within last 6 months    Recent Outpatient Visits           4 days ago S/P laparoscopic appendectomy   [place] [facility] [place], Sherran Needs, NP   4 months ago Primary hypertension   [place] [facility] Larae Grooms, NP   6 months ago Primary hypertension   [place] [facility] Larae Grooms, NP   9 months ago Annual physical exam   [facility] Larae Grooms, NP   1 year ago Type 2 diabetes mellitus with diabetic neuropathy, with long-term current use of insulin (HCC)   [place] [facility] Vigg, Avanti, MD       Future Appointments             In 1 month Larae Grooms, NP [facility]

## 2023-02-06 NOTE — Telephone Encounter (Signed)
Already requested by pharmacy and refilled today in a separate refill encounter.

## 2023-02-06 NOTE — Telephone Encounter (Addendum)
Medication Refill - Medication:  OZEMPIC, 0.25 OR 0.5 MG/DOSE, 2 MG/3ML SOPN COMPLETELY OUT  Due to take injection tomorrow.    Patient stated he is waiting at the pharmacy and does not have a ride to get to the pharmacy everyday, he can only get a ride on certain days and wants this called in today*   Has the patient contacted their pharmacy? Yes.  Advised to contact PCP, stated the pharmacy faxed a request over.  Preferred Pharmacy (with phone number or street name): [facility] [place], [place] - [address]  Phone: [phone] Fax: [phone]  Has the patient been seen for an appointment in the last year OR does the patient have an upcoming appointment? Yes.  F/U on 04/06/2023

## 2023-03-04 IMAGING — US US EXTREM LOW VENOUS*L*
1 series · 13 of 24 positions shown · non-contrast
Comparison: None.

CLINICAL DATA: Pain, edema and bruising of the left lower
extremity.



[Series 1: us venous img lower uni left (dvt) · portal-venous · 13 of 35 slices shown]
[im 1/35]
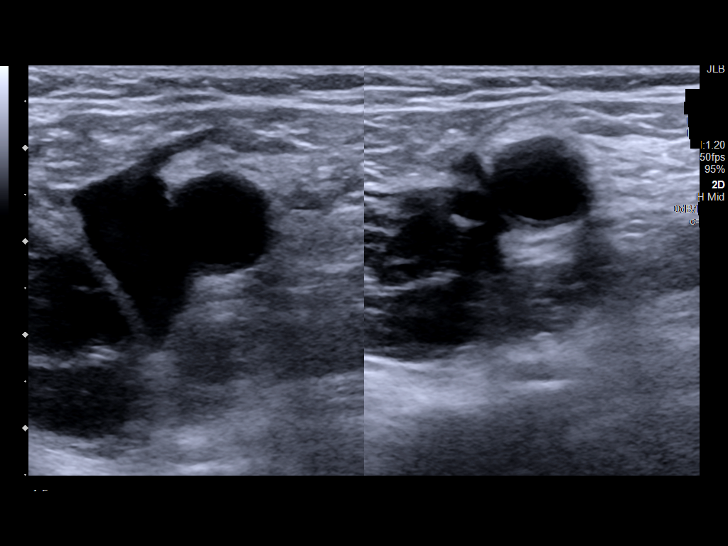
[im 3/35]
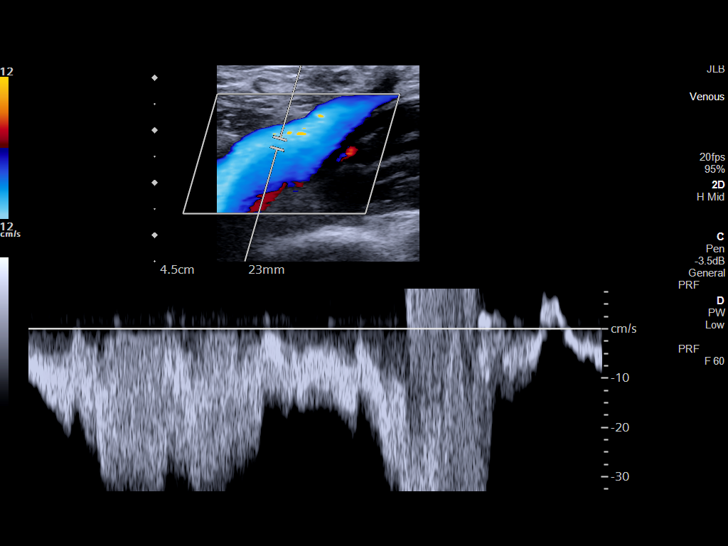
[im 6/35]
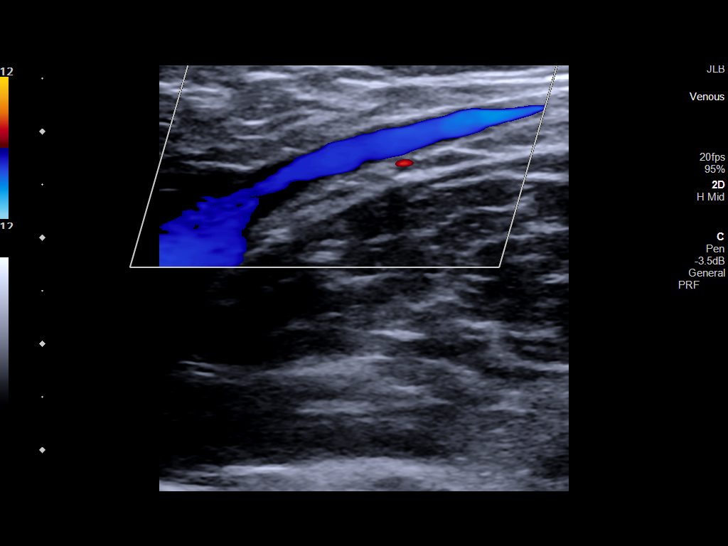
[im 9/35]
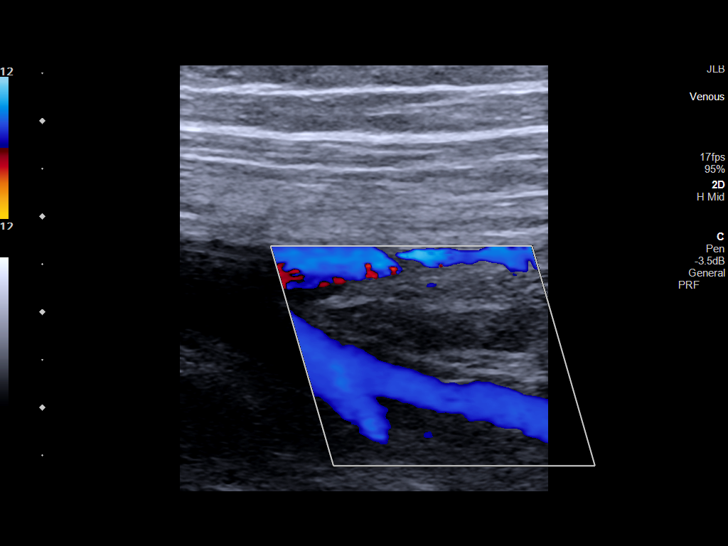
[im 12/35]
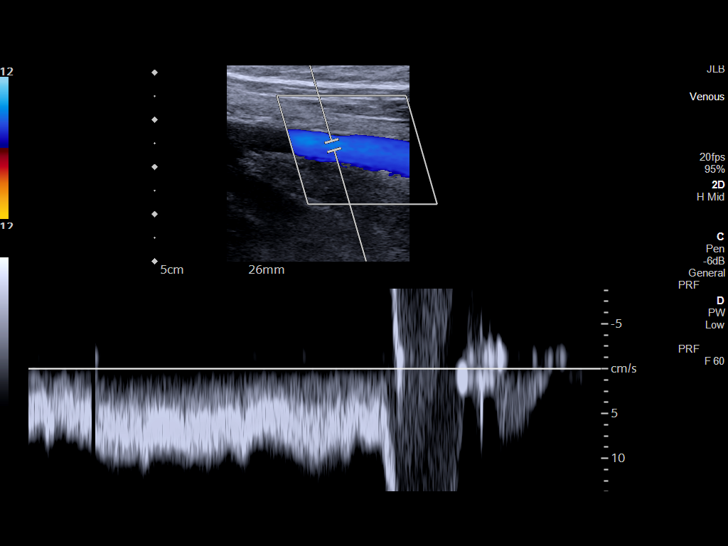
[im 15/35]
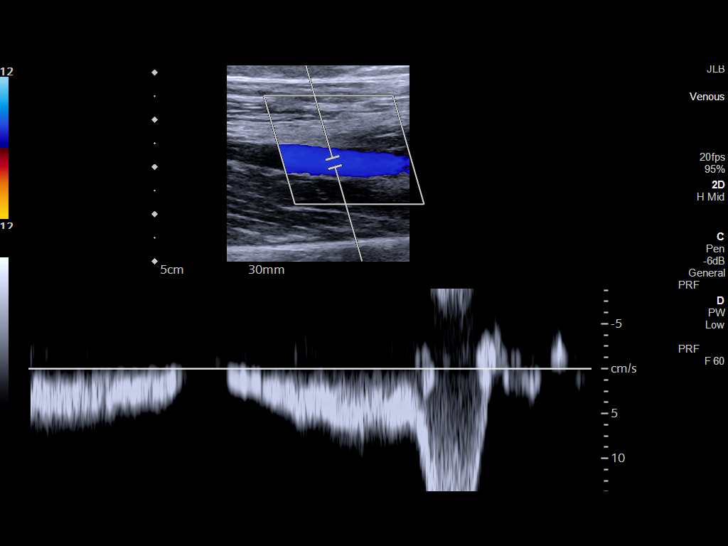
[im 18/35]
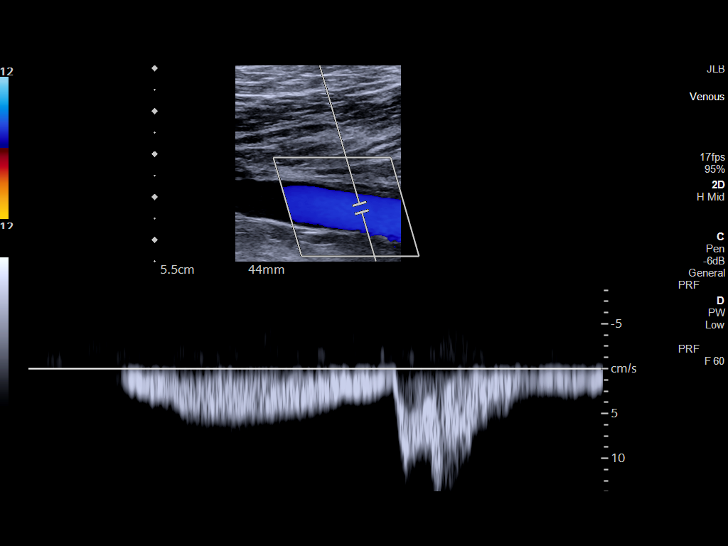
[im 20/35]
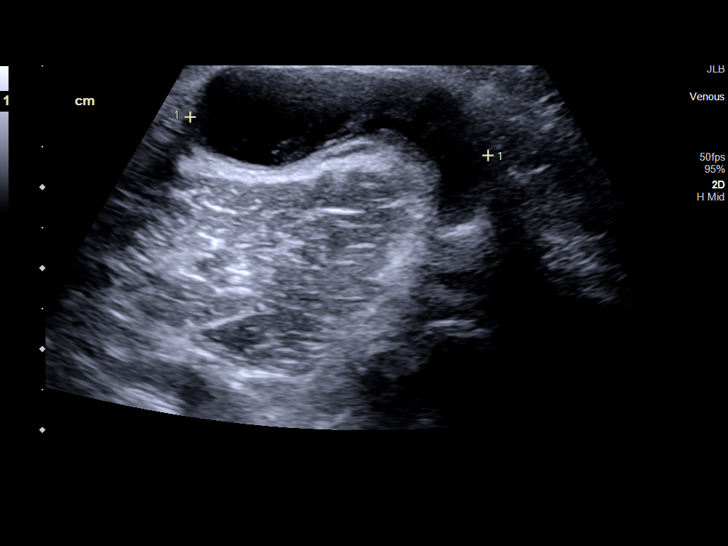
[im 23/35]
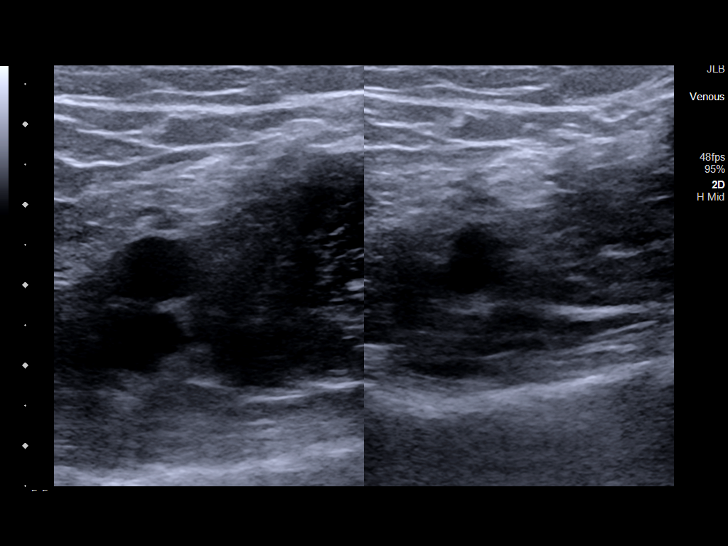
[im 26/35]
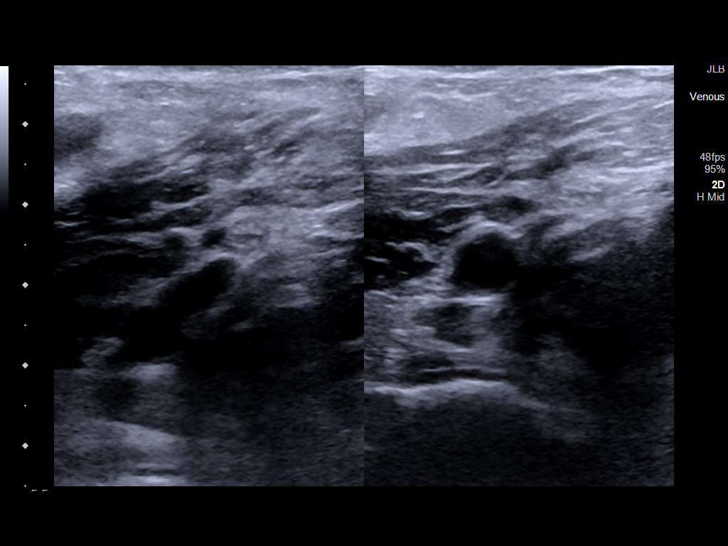
[im 29/35]
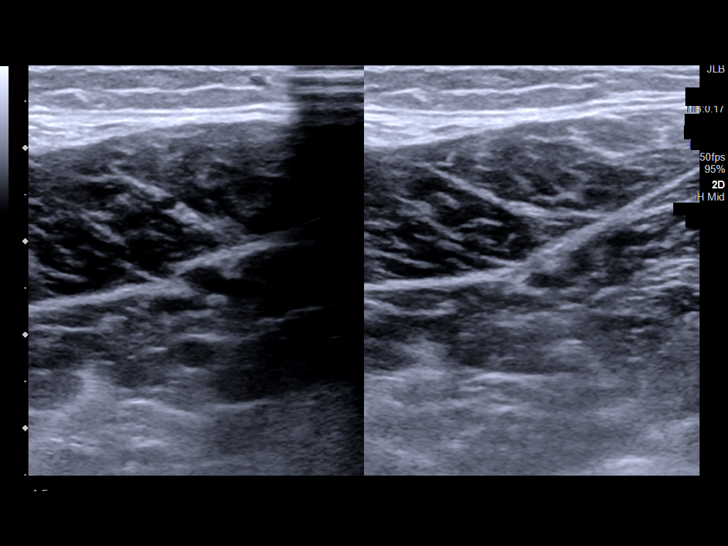
[im 32/35]
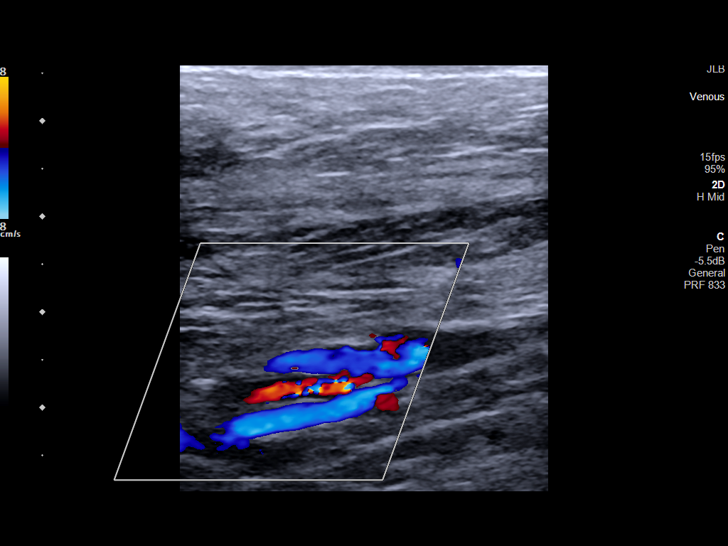
[im 35/35]
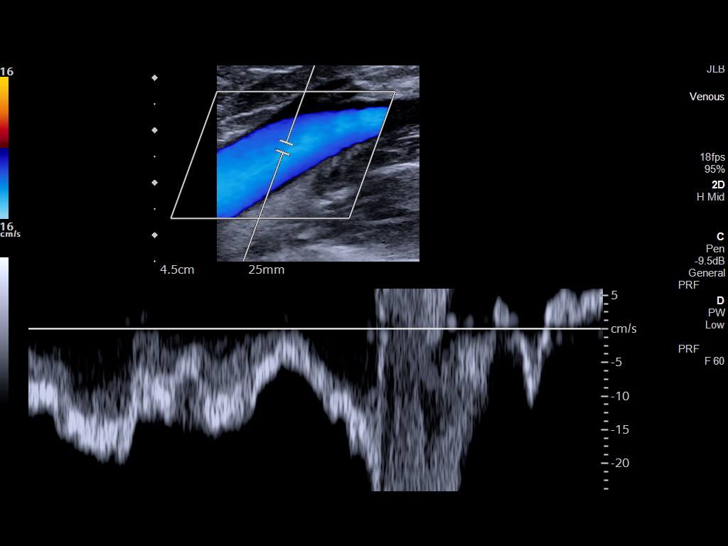

[13 of 24 positions shown; findings below may reference images not displayed]

FINDINGS: Contralateral Common Femoral Vein: Respiratory phasicity is normal
and symmetric with the symptomatic side. No evidence of thrombus.
Normal compressibility.

Common Femoral Vein: No evidence of thrombus. Normal
compressibility, respiratory phasicity and response to augmentation.

Saphenofemoral Junction: No evidence of thrombus. Normal
compressibility and flow on color Doppler imaging.

Profunda Femoral Vein: No evidence of thrombus. Normal
compressibility and flow on color Doppler imaging.

Femoral Vein: No evidence of thrombus. Normal compressibility,
respiratory phasicity and response to augmentation.

Popliteal Vein: No evidence of thrombus. Normal compressibility,
respiratory phasicity and response to augmentation.

Calf Veins: No evidence of thrombus. Normal compressibility and flow
on color Doppler imaging.

Superficial Great Saphenous Vein: No evidence of thrombus. Normal
compressibility.

Venous Reflux:  None.

Other Findings: No evidence of superficial thrombophlebitis.
Popliteal fossa fluid collection measures 5 x 1.2 x 3.5 cm and is
consistent with a Baker's cyst.
IMPRESSION: 1. No evidence of left lower extremity deep vein thrombosis.
2. Left popliteal fossa Baker's cyst measuring up to 5 cm in maximum
diameter.

## 2023-03-16 ENCOUNTER — Ambulatory Visit: Payer: Medicaid Other | Admitting: Podiatry

## 2023-03-26 ENCOUNTER — Ambulatory Visit: Payer: MEDICAID | Admitting: Podiatry

## 2023-03-26 ENCOUNTER — Encounter: Payer: Self-pay | Admitting: Podiatry

## 2023-03-26 DIAGNOSIS — M79675 Pain in left toe(s): Secondary | ICD-10-CM | POA: Diagnosis not present

## 2023-03-26 DIAGNOSIS — B351 Tinea unguium: Secondary | ICD-10-CM

## 2023-03-26 DIAGNOSIS — E1140 Type 2 diabetes mellitus with diabetic neuropathy, unspecified: Secondary | ICD-10-CM

## 2023-03-26 DIAGNOSIS — Z794 Long term (current) use of insulin: Secondary | ICD-10-CM

## 2023-03-26 DIAGNOSIS — M79674 Pain in right toe(s): Secondary | ICD-10-CM

## 2023-03-29 NOTE — Progress Notes (Signed)
  Subjective:  Patient ID: Lance Vaughan, male    DOB: February 02, 1966,  MRN: [id number]  Lance Vaughan presents to clinic today for at risk foot care with history of diabetic neuropathy and painful elongated mycotic toenails 1-5 bilaterally which are tender when wearing enclosed shoe gear. Pain is relieved with periodic professional debridement.  Chief Complaint  Patient presents with   Diabetes    DFC BS-117 A1C- PCPV-02/2023   New problem(s): None.   PCP is Larae Grooms, NP.  Allergies  Allergen Reactions   Lisinopril Other (See Comments)    Chest pain    Review of Systems: Negative except as noted in the HPI.  Objective: No changes noted in today's physical examination. There were no vitals filed for this visit. Lance Vaughan is a pleasant 57 y.o. male in NAD. AAO x 3.  Vascular Examination: Capillary refill time immediate b/l. Vascular status intact b/l with palpable pedal pulses. Pedal hair sparse b/l. No edema. No pain with calf compression b/l. Skin temperature gradient WNL b/l. No cyanosis or clubbing b/l. No ischemia or gangrene noted b/l LE.  Neurological Examination: Sensation grossly intact b/l with 10 gram monofilament. Vibratory sensation intact b/l. Pt has subjective symptoms of neuropathy.  Dermatological Examination: Pedal skin with normal turgor, texture and tone b/l.  No open wounds. No interdigital macerations.   Toenails 1-5 b/l thick, discolored, elongated with subungual debris and pain on dorsal palpation.   Hyperkeratotic lesion(s) right great toe, submet head 3 b/l, submet head 4 b/l, and submet head 5 b/l.  No erythema, no edema, no drainage, no fluctuance.  Musculoskeletal Examination: Normal muscle strength 5/5 to all lower extremity muscle groups bilaterally. Hammertoe deformity noted 2-5 b/l.Marland Kitchen No pain, crepitus or joint limitation noted with ROM b/l LE.  Patient ambulates independently without assistive aids.  Radiographs:  None  Assessment/Plan: 1. Pain due to onychomycosis of toenails of both feet   2. Type 2 diabetes mellitus with diabetic neuropathy, with long-term current use of insulin (HCC)    -Patient was evaluated and treated. All patient's and/or POA's questions/concerns answered on today's visit. -Patient refuses callus debridement on today's visit. -Continue supportive shoe gear daily. -Mycotic toenails 1-5 bilaterally were debrided in length and girth with sterile nail nippers and dremel without incident. -Patient/POA to call should there be question/concern in the interim.   Return in about 3 months (around 06/26/2023).  Freddie Breech, DPM

## 2023-04-06 ENCOUNTER — Ambulatory Visit: Payer: MEDICAID | Admitting: Nurse Practitioner

## 2023-04-06 NOTE — Progress Notes (Deleted)
There were no vitals taken for this visit.   Subjective:    Patient ID: Lance Vaughan, male    DOB: [birth year], 57 y.o.   MRN: [id number]  HPI: Lance Vaughan is a 57 y.o. male  No chief complaint on file.  HYPERTENSION with Chronic Kidney Disease Hypertension status: controlled  Satisfied with current treatment? yes Duration of hypertension: years BP monitoring frequency:  not checking BP range:  BP medication side effects:  no Medication compliance: excellent compliance Previous BP meds: metoprolol, amlodipine, and valsartan Aspirin: no Recurrent headaches: yes Visual changes: no Palpitations: no Dyspnea: no Chest pain: yes Lower extremity edema: no Dizzy/lightheaded: yes  DIABETES Patient states he still has Neuropathy.  The Gabapentin doesn't help with his symptoms. He is only taking it at night right now Hypoglycemic episodes:no Polydipsia/polyuria: no Visual disturbance: yes Chest pain: yes Paresthesias: yes Glucose Monitoring: yes  Accucheck frequency: Daily  Fasting glucose: 109  Post prandial:  Evening:  Before meals: Taking Insulin?: no  Long acting insulin:  Short acting insulin: Blood Pressure Monitoring: not checking Retinal Examination: Not up to Date Foot Exam: Up to Date Diabetic Education: Not Completed Pneumovax: Up to Date Influenza: Up to Date Aspirin: no  CHEST PAIN Duration: month Onset: gradual Description of breathing discomfort: none Severity: moderate Episode duration: lasts till he falls asleep Frequency: almost everyday Related to exertion: no Cough: no Chest tightness:  sometimes Wheezing: no Fevers: no Chest pain: yes Palpitations: yes  Nausea: no Diaphoresis: no Deconditioning: no Status: stable Worse after he eats.     Relevant past medical, surgical, family and social history reviewed and updated as indicated. Interim medical history since our last visit reviewed. Allergies and medications reviewed and  updated.  Review of Systems  Eyes:  Negative for visual disturbance.  Respiratory:  Positive for chest tightness and shortness of breath.   Cardiovascular:  Positive for chest pain. Negative for palpitations and leg swelling.  Endocrine: Negative for polydipsia and polyuria.  Neurological:  Positive for dizziness and headaches. Negative for light-headedness and numbness.    Per HPI unless specifically indicated above     Objective:    There were no vitals taken for this visit.  Wt Readings from Last 3 Encounters:  02/02/23 222 lb (100.7 kg)  02/02/23 222 lb 6.4 oz (100.9 kg)  01/26/23 237 lb (107.5 kg)    Physical Exam Vitals and nursing note reviewed.  Constitutional:      General: He is not in acute distress.    Appearance: Normal appearance. He is not ill-appearing, toxic-appearing or diaphoretic.  HENT:     Head: Normocephalic.     Right Ear: External ear normal.     Left Ear: External ear normal.     Nose: Nose normal. No congestion or rhinorrhea.     Mouth/Throat:     Mouth: Mucous membranes are moist.  Eyes:     General:        Right eye: No discharge.        Left eye: No discharge.     Extraocular Movements: Extraocular movements intact.     Conjunctiva/sclera: Conjunctivae normal.     Pupils: Pupils are equal, round, and reactive to light.  Cardiovascular:     Rate and Rhythm: Normal rate and regular rhythm.     Heart sounds: No murmur heard. Pulmonary:     Effort: Pulmonary effort is normal. No respiratory distress.     Breath sounds: Normal breath sounds. No wheezing, rhonchi or  rales.  Abdominal:     General: Abdomen is flat. Bowel sounds are normal.  Musculoskeletal:     Cervical back: Normal range of motion and neck supple.  Skin:    General: Skin is warm and dry.     Capillary Refill: Capillary refill takes less than 2 seconds.  Neurological:     General: No focal deficit present.     Mental Status: He is alert and oriented to person, place, and  time.  Psychiatric:        Mood and Affect: Mood normal.        Behavior: Behavior normal.        Thought Content: Thought content normal.        Judgment: Judgment normal.    Results for orders placed or performed during the hospital encounter of 01/25/23  Lipase, blood  Result Value Ref Range   Lipase 29 11 - 51 U/L  Comprehensive metabolic panel  Result Value Ref Range   Sodium 139 135 - 145 mmol/L   Potassium 3.8 3.5 - 5.1 mmol/L   Chloride 104 98 - 111 mmol/L   CO2 25 22 - 32 mmol/L   Glucose, Bld 132 (H) 70 - 99 mg/dL   BUN 11 6 - 20 mg/dL   Creatinine, Ser 4.09 0.61 - 1.24 mg/dL   Calcium 9.0 8.9 - 81.1 mg/dL   Total Protein 8.0 6.5 - 8.1 g/dL   Albumin 4.3 3.5 - 5.0 g/dL   AST 32 15 - 41 U/L   ALT 40 0 - 44 U/L   Alkaline Phosphatase 65 38 - 126 U/L   Total Bilirubin 0.4 0.3 - 1.2 mg/dL   GFR, Estimated >91 >47 mL/min   Anion gap 10 5 - 15  CBC  Result Value Ref Range   WBC 6.7 4.0 - 10.5 K/uL   RBC 4.93 4.22 - 5.81 MIL/uL   Hemoglobin 16.0 13.0 - 17.0 g/dL   HCT 82.9 56.2 - 13.0 %   MCV 95.9 80.0 - 100.0 fL   MCH 32.5 26.0 - 34.0 pg   MCHC 33.8 30.0 - 36.0 g/dL   RDW 86.5 78.4 - 69.6 %   Platelets 277 150 - 400 K/uL   nRBC 0.0 0.0 - 0.2 %  Urinalysis, Routine w reflex microscopic -Urine, Clean Catch  Result Value Ref Range   Color, Urine STRAW (A) YELLOW   APPearance CLEAR (A) CLEAR   Specific Gravity, Urine >1.046 (H) 1.005 - 1.030   pH 6.0 5.0 - 8.0   Glucose, UA NEGATIVE NEGATIVE mg/dL   Hgb urine dipstick NEGATIVE NEGATIVE   Bilirubin Urine NEGATIVE NEGATIVE   Ketones, ur NEGATIVE NEGATIVE mg/dL   Protein, ur NEGATIVE NEGATIVE mg/dL   Nitrite NEGATIVE NEGATIVE   Leukocytes,Ua NEGATIVE NEGATIVE  Hemoglobin A1c  Result Value Ref Range   Hgb A1c MFr Bld 5.8 (H) 4.8 - 5.6 %   Mean Plasma Glucose 119.76 mg/dL  HIV Antibody (routine testing w rflx)  Result Value Ref Range   HIV Screen 4th Generation wRfx Non Reactive Non Reactive  Glucose,  capillary  Result Value Ref Range   Glucose-Capillary 102 (H) 70 - 99 mg/dL  Glucose, capillary  Result Value Ref Range   Glucose-Capillary 113 (H) 70 - 99 mg/dL  Glucose, capillary  Result Value Ref Range   Glucose-Capillary 104 (H) 70 - 99 mg/dL  Glucose, capillary  Result Value Ref Range   Glucose-Capillary 165 (H) 70 - 99 mg/dL  Glucose, capillary  Result  Value Ref Range   Glucose-Capillary 151 (H) 70 - 99 mg/dL  CBC  Result Value Ref Range   WBC 9.6 4.0 - 10.5 K/uL   RBC 3.73 (L) 4.22 - 5.81 MIL/uL   Hemoglobin 12.3 (L) 13.0 - 17.0 g/dL   HCT 16.1 (L) 09.6 - 04.5 %   MCV 95.4 80.0 - 100.0 fL   MCH 33.0 26.0 - 34.0 pg   MCHC 34.6 30.0 - 36.0 g/dL   RDW 40.9 81.1 - 91.4 %   Platelets 226 150 - 400 K/uL   nRBC 0.0 0.0 - 0.2 %  Comprehensive metabolic panel  Result Value Ref Range   Sodium 136 135 - 145 mmol/L   Potassium 4.0 3.5 - 5.1 mmol/L   Chloride 108 98 - 111 mmol/L   CO2 22 22 - 32 mmol/L   Glucose, Bld 115 (H) 70 - 99 mg/dL   BUN 25 (H) 6 - 20 mg/dL   Creatinine, Ser 7.82 (H) 0.61 - 1.24 mg/dL   Calcium 7.3 (L) 8.9 - 10.3 mg/dL   Total Protein 6.0 (L) 6.5 - 8.1 g/dL   Albumin 3.2 (L) 3.5 - 5.0 g/dL   AST 20 15 - 41 U/L   ALT 28 0 - 44 U/L   Alkaline Phosphatase 43 38 - 126 U/L   Total Bilirubin 0.6 0.3 - 1.2 mg/dL   GFR, Estimated 56 (L) >60 mL/min   Anion gap 6 5 - 15  Glucose, capillary  Result Value Ref Range   Glucose-Capillary 129 (H) 70 - 99 mg/dL  Glucose, capillary  Result Value Ref Range   Glucose-Capillary 142 (H) 70 - 99 mg/dL  Glucose, capillary  Result Value Ref Range   Glucose-Capillary 120 (H) 70 - 99 mg/dL  Glucose, capillary  Result Value Ref Range   Glucose-Capillary 102 (H) 70 - 99 mg/dL      Assessment & Plan:   Problem List Items Addressed This Visit       Cardiovascular and Mediastinum   Hypertension - Primary     Endocrine   Type 2 diabetes mellitus with diabetic neuropathy, unspecified (HCC)     Other    Hyperlipidemia   Major depression, recurrent (HCC)   Bipolar disorder (HCC)   Morbid obesity (HCC)     Follow up plan: No follow-ups on file.

## 2023-04-09 ENCOUNTER — Ambulatory Visit: Payer: MEDICAID | Admitting: Nurse Practitioner

## 2023-04-09 NOTE — Progress Notes (Deleted)
There were no vitals taken for this visit.   Subjective:    Patient ID: Lance Vaughan, male    DOB: March 30, 1966, 57 y.o.   MRN: [id number]  HPI: Lance Vaughan is a 57 y.o. male  No chief complaint on file.  HYPERTENSION with Chronic Kidney Disease Hypertension status: controlled  Satisfied with current treatment? yes Duration of hypertension: years BP monitoring frequency:  not checking BP range:  BP medication side effects:  no Medication compliance: excellent compliance Previous BP meds: metoprolol, amlodipine, and valsartan Aspirin: no Recurrent headaches: yes Visual changes: no Palpitations: no Dyspnea: no Chest pain: yes Lower extremity edema: no Dizzy/lightheaded: yes  DIABETES Patient states he still has Neuropathy.  The Gabapentin doesn't help with his symptoms. He is only taking it at night right now Hypoglycemic episodes:no Polydipsia/polyuria: no Visual disturbance: yes Chest pain: yes Paresthesias: yes Glucose Monitoring: yes  Accucheck frequency: Daily  Fasting glucose: 109  Post prandial:  Evening:  Before meals: Taking Insulin?: no  Long acting insulin:  Short acting insulin: Blood Pressure Monitoring: not checking Retinal Examination: Not up to Date Foot Exam: Up to Date Diabetic Education: Not Completed Pneumovax: Up to Date Influenza: Up to Date Aspirin: no  CHEST PAIN Duration: month Onset: gradual Description of breathing discomfort: none Severity: moderate Episode duration: lasts till he falls asleep Frequency: almost everyday Related to exertion: no Cough: no Chest tightness:  sometimes Wheezing: no Fevers: no Chest pain: yes Palpitations: yes  Nausea: no Diaphoresis: no Deconditioning: no Status: stable Worse after he eats.     Relevant past medical, surgical, family and social history reviewed and updated as indicated. Interim medical history since our last visit reviewed. Allergies and medications reviewed and  updated.  Review of Systems  Eyes:  Negative for visual disturbance.  Respiratory:  Positive for chest tightness and shortness of breath.   Cardiovascular:  Positive for chest pain. Negative for palpitations and leg swelling.  Endocrine: Negative for polydipsia and polyuria.  Neurological:  Positive for dizziness and headaches. Negative for light-headedness and numbness.    Per HPI unless specifically indicated above     Objective:    There were no vitals taken for this visit.  Wt Readings from Last 3 Encounters:  02/02/23 222 lb (100.7 kg)  02/02/23 222 lb 6.4 oz (100.9 kg)  01/26/23 237 lb (107.5 kg)    Physical Exam Vitals and nursing note reviewed.  Constitutional:      General: He is not in acute distress.    Appearance: Normal appearance. He is not ill-appearing, toxic-appearing or diaphoretic.  HENT:     Head: Normocephalic.     Right Ear: External ear normal.     Left Ear: External ear normal.     Nose: Nose normal. No congestion or rhinorrhea.     Mouth/Throat:     Mouth: Mucous membranes are moist.  Eyes:     General:        Right eye: No discharge.        Left eye: No discharge.     Extraocular Movements: Extraocular movements intact.     Conjunctiva/sclera: Conjunctivae normal.     Pupils: Pupils are equal, round, and reactive to light.  Cardiovascular:     Rate and Rhythm: Normal rate and regular rhythm.     Heart sounds: No murmur heard. Pulmonary:     Effort: Pulmonary effort is normal. No respiratory distress.     Breath sounds: Normal breath sounds. No wheezing, rhonchi or  rales.  Abdominal:     General: Abdomen is flat. Bowel sounds are normal.  Musculoskeletal:     Cervical back: Normal range of motion and neck supple.  Skin:    General: Skin is warm and dry.     Capillary Refill: Capillary refill takes less than 2 seconds.  Neurological:     General: No focal deficit present.     Mental Status: He is alert and oriented to person, place, and  time.  Psychiatric:        Mood and Affect: Mood normal.        Behavior: Behavior normal.        Thought Content: Thought content normal.        Judgment: Judgment normal.    Results for orders placed or performed during the hospital encounter of 01/25/23  Lipase, blood  Result Value Ref Range   Lipase 29 11 - 51 U/L  Comprehensive metabolic panel  Result Value Ref Range   Sodium 139 135 - 145 mmol/L   Potassium 3.8 3.5 - 5.1 mmol/L   Chloride 104 98 - 111 mmol/L   CO2 25 22 - 32 mmol/L   Glucose, Bld 132 (H) 70 - 99 mg/dL   BUN 11 6 - 20 mg/dL   Creatinine, Ser 8.29 0.61 - 1.24 mg/dL   Calcium 9.0 8.9 - 56.2 mg/dL   Total Protein 8.0 6.5 - 8.1 g/dL   Albumin 4.3 3.5 - 5.0 g/dL   AST 32 15 - 41 U/L   ALT 40 0 - 44 U/L   Alkaline Phosphatase 65 38 - 126 U/L   Total Bilirubin 0.4 0.3 - 1.2 mg/dL   GFR, Estimated >13 >08 mL/min   Anion gap 10 5 - 15  CBC  Result Value Ref Range   WBC 6.7 4.0 - 10.5 K/uL   RBC 4.93 4.22 - 5.81 MIL/uL   Hemoglobin 16.0 13.0 - 17.0 g/dL   HCT 65.7 84.6 - 96.2 %   MCV 95.9 80.0 - 100.0 fL   MCH 32.5 26.0 - 34.0 pg   MCHC 33.8 30.0 - 36.0 g/dL   RDW 95.2 84.1 - 32.4 %   Platelets 277 150 - 400 K/uL   nRBC 0.0 0.0 - 0.2 %  Urinalysis, Routine w reflex microscopic -Urine, Clean Catch  Result Value Ref Range   Color, Urine STRAW (A) YELLOW   APPearance CLEAR (A) CLEAR   Specific Gravity, Urine >1.046 (H) 1.005 - 1.030   pH 6.0 5.0 - 8.0   Glucose, UA NEGATIVE NEGATIVE mg/dL   Hgb urine dipstick NEGATIVE NEGATIVE   Bilirubin Urine NEGATIVE NEGATIVE   Ketones, ur NEGATIVE NEGATIVE mg/dL   Protein, ur NEGATIVE NEGATIVE mg/dL   Nitrite NEGATIVE NEGATIVE   Leukocytes,Ua NEGATIVE NEGATIVE  Hemoglobin A1c  Result Value Ref Range   Hgb A1c MFr Bld 5.8 (H) 4.8 - 5.6 %   Mean Plasma Glucose 119.76 mg/dL  HIV Antibody (routine testing w rflx)  Result Value Ref Range   HIV Screen 4th Generation wRfx Non Reactive Non Reactive  Glucose,  capillary  Result Value Ref Range   Glucose-Capillary 102 (H) 70 - 99 mg/dL  Glucose, capillary  Result Value Ref Range   Glucose-Capillary 113 (H) 70 - 99 mg/dL  Glucose, capillary  Result Value Ref Range   Glucose-Capillary 104 (H) 70 - 99 mg/dL  Glucose, capillary  Result Value Ref Range   Glucose-Capillary 165 (H) 70 - 99 mg/dL  Glucose, capillary  Result  Value Ref Range   Glucose-Capillary 151 (H) 70 - 99 mg/dL  CBC  Result Value Ref Range   WBC 9.6 4.0 - 10.5 K/uL   RBC 3.73 (L) 4.22 - 5.81 MIL/uL   Hemoglobin 12.3 (L) 13.0 - 17.0 g/dL   HCT 40.9 (L) 81.1 - 91.4 %   MCV 95.4 80.0 - 100.0 fL   MCH 33.0 26.0 - 34.0 pg   MCHC 34.6 30.0 - 36.0 g/dL   RDW 78.2 95.6 - 21.3 %   Platelets 226 150 - 400 K/uL   nRBC 0.0 0.0 - 0.2 %  Comprehensive metabolic panel  Result Value Ref Range   Sodium 136 135 - 145 mmol/L   Potassium 4.0 3.5 - 5.1 mmol/L   Chloride 108 98 - 111 mmol/L   CO2 22 22 - 32 mmol/L   Glucose, Bld 115 (H) 70 - 99 mg/dL   BUN 25 (H) 6 - 20 mg/dL   Creatinine, Ser 0.86 (H) 0.61 - 1.24 mg/dL   Calcium 7.3 (L) 8.9 - 10.3 mg/dL   Total Protein 6.0 (L) 6.5 - 8.1 g/dL   Albumin 3.2 (L) 3.5 - 5.0 g/dL   AST 20 15 - 41 U/L   ALT 28 0 - 44 U/L   Alkaline Phosphatase 43 38 - 126 U/L   Total Bilirubin 0.6 0.3 - 1.2 mg/dL   GFR, Estimated 56 (L) >60 mL/min   Anion gap 6 5 - 15  Glucose, capillary  Result Value Ref Range   Glucose-Capillary 129 (H) 70 - 99 mg/dL  Glucose, capillary  Result Value Ref Range   Glucose-Capillary 142 (H) 70 - 99 mg/dL  Glucose, capillary  Result Value Ref Range   Glucose-Capillary 120 (H) 70 - 99 mg/dL  Glucose, capillary  Result Value Ref Range   Glucose-Capillary 102 (H) 70 - 99 mg/dL      Assessment & Plan:   Problem List Items Addressed This Visit       Cardiovascular and Mediastinum   Hypertension - Primary     Endocrine   Type 2 diabetes mellitus with diabetic neuropathy, unspecified (HCC)     Other    Hyperlipidemia   Major depression, recurrent (HCC)   Bipolar disorder (HCC)   Morbid obesity (HCC)     Follow up plan: No follow-ups on file.

## 2023-04-22 ENCOUNTER — Other Ambulatory Visit: Payer: Self-pay | Admitting: Nurse Practitioner

## 2023-04-22 NOTE — Telephone Encounter (Signed)
Medication Refill - Medication:  dapagliflozin propanediol (FARXIGA) 10 MG TABS tablet  Semaglutide,0.25 or 0.5MG /DOS, (OZEMPIC, 0.25 OR 0.5 MG/DOSE,) 2 MG/3ML SOPN,   glucose blood (ACCU-CHEK AVIVA PLUS) test strip   Has the patient contacted their pharmacy? Yes.   Pt states he needs this medication. But his ride did not show up to take him to his appt.  He asked Preferred Pharmacy (with phone number or street name):  [facility] [place], [place] - [address]   Has the patient been seen for an appointment in the last year OR does the patient have an upcoming appointment? Yes.    Agent: Please be advised that RX refills may take up to 3 business days. We ask that you follow-up with your pharmacy.

## 2023-04-23 MED ORDER — ACCU-CHEK AVIVA PLUS VI STRP: ORAL_STRIP | 2 refills | Status: DC

## 2023-04-23 MED ORDER — OZEMPIC (0.25 OR 0.5 MG/DOSE) 2 MG/3ML ~~LOC~~ SOPN: 0.5000 mg | PEN_INJECTOR | SUBCUTANEOUS | 0 refills | Status: DC

## 2023-04-23 MED ORDER — DAPAGLIFLOZIN PROPANEDIOL 10 MG PO TABS: 10.0000 mg | ORAL_TABLET | Freq: Every day | ORAL | 1 refills | Status: DC

## 2023-04-29 ENCOUNTER — Ambulatory Visit: Payer: MEDICAID | Admitting: Nurse Practitioner

## 2023-05-06 ENCOUNTER — Ambulatory Visit: Payer: MEDICAID | Admitting: Nurse Practitioner

## 2023-05-06 NOTE — Progress Notes (Deleted)
There were no vitals taken for this visit.   Subjective:    Patient ID: Lance Vaughan, male    DOB: 1965/10/03, 57 y.o.   MRN: [id number]  HPI: Man Mom is a 57 y.o. male  No chief complaint on file.  HYPERTENSION with Chronic Kidney Disease Hypertension status: controlled  Satisfied with current treatment? yes Duration of hypertension: years BP monitoring frequency:  not checking BP range:  BP medication side effects:  no Medication compliance: excellent compliance Previous BP meds: metoprolol, amlodipine, and valsartan Aspirin: no Recurrent headaches: yes Visual changes: no Palpitations: no Dyspnea: no Chest pain: yes Lower extremity edema: no Dizzy/lightheaded: yes  DIABETES Patient states he still has Neuropathy.  The Gabapentin doesn't help with his symptoms. He is only taking it at night right now Hypoglycemic episodes:no Polydipsia/polyuria: no Visual disturbance: yes Chest pain: yes Paresthesias: yes Glucose Monitoring: yes  Accucheck frequency: Daily  Fasting glucose: 109  Post prandial:  Evening:  Before meals: Taking Insulin?: no  Long acting insulin:  Short acting insulin: Blood Pressure Monitoring: not checking Retinal Examination: Not up to Date Foot Exam: Up to Date Diabetic Education: Not Completed Pneumovax: Up to Date Influenza: Up to Date Aspirin: no  CHEST PAIN Duration: month Onset: gradual Description of breathing discomfort: none Severity: moderate Episode duration: lasts till he falls asleep Frequency: almost everyday Related to exertion: no Cough: no Chest tightness:  sometimes Wheezing: no Fevers: no Chest pain: yes Palpitations: yes  Nausea: no Diaphoresis: no Deconditioning: no Status: stable Worse after he eats.     Relevant past medical, surgical, family and social history reviewed and updated as indicated. Interim medical history since our last visit reviewed. Allergies and medications reviewed and  updated.  Review of Systems  Eyes:  Negative for visual disturbance.  Respiratory:  Positive for chest tightness and shortness of breath.   Cardiovascular:  Positive for chest pain. Negative for palpitations and leg swelling.  Endocrine: Negative for polydipsia and polyuria.  Neurological:  Positive for dizziness and headaches. Negative for light-headedness and numbness.    Per HPI unless specifically indicated above     Objective:    There were no vitals taken for this visit.  Wt Readings from Last 3 Encounters:  02/02/23 222 lb (100.7 kg)  02/02/23 222 lb 6.4 oz (100.9 kg)  01/26/23 237 lb (107.5 kg)    Physical Exam Vitals and nursing note reviewed.  Constitutional:      General: He is not in acute distress.    Appearance: Normal appearance. He is not ill-appearing, toxic-appearing or diaphoretic.  HENT:     Head: Normocephalic.     Right Ear: External ear normal.     Left Ear: External ear normal.     Nose: Nose normal. No congestion or rhinorrhea.     Mouth/Throat:     Mouth: Mucous membranes are moist.  Eyes:     General:        Right eye: No discharge.        Left eye: No discharge.     Extraocular Movements: Extraocular movements intact.     Conjunctiva/sclera: Conjunctivae normal.     Pupils: Pupils are equal, round, and reactive to light.  Cardiovascular:     Rate and Rhythm: Normal rate and regular rhythm.     Heart sounds: No murmur heard. Pulmonary:     Effort: Pulmonary effort is normal. No respiratory distress.     Breath sounds: Normal breath sounds. No wheezing, rhonchi or  rales.  Abdominal:     General: Abdomen is flat. Bowel sounds are normal.  Musculoskeletal:     Cervical back: Normal range of motion and neck supple.  Skin:    General: Skin is warm and dry.     Capillary Refill: Capillary refill takes less than 2 seconds.  Neurological:     General: No focal deficit present.     Mental Status: He is alert and oriented to person, place, and  time.  Psychiatric:        Mood and Affect: Mood normal.        Behavior: Behavior normal.        Thought Content: Thought content normal.        Judgment: Judgment normal.    Results for orders placed or performed during the hospital encounter of 01/25/23  Lipase, blood  Result Value Ref Range   Lipase 29 11 - 51 U/L  Comprehensive metabolic panel  Result Value Ref Range   Sodium 139 135 - 145 mmol/L   Potassium 3.8 3.5 - 5.1 mmol/L   Chloride 104 98 - 111 mmol/L   CO2 25 22 - 32 mmol/L   Glucose, Bld 132 (H) 70 - 99 mg/dL   BUN 11 6 - 20 mg/dL   Creatinine, Ser 4.78 0.61 - 1.24 mg/dL   Calcium 9.0 8.9 - 29.5 mg/dL   Total Protein 8.0 6.5 - 8.1 g/dL   Albumin 4.3 3.5 - 5.0 g/dL   AST 32 15 - 41 U/L   ALT 40 0 - 44 U/L   Alkaline Phosphatase 65 38 - 126 U/L   Total Bilirubin 0.4 0.3 - 1.2 mg/dL   GFR, Estimated >62 >13 mL/min   Anion gap 10 5 - 15  CBC  Result Value Ref Range   WBC 6.7 4.0 - 10.5 K/uL   RBC 4.93 4.22 - 5.81 MIL/uL   Hemoglobin 16.0 13.0 - 17.0 g/dL   HCT 08.6 57.8 - 46.9 %   MCV 95.9 80.0 - 100.0 fL   MCH 32.5 26.0 - 34.0 pg   MCHC 33.8 30.0 - 36.0 g/dL   RDW 62.9 52.8 - 41.3 %   Platelets 277 150 - 400 K/uL   nRBC 0.0 0.0 - 0.2 %  Urinalysis, Routine w reflex microscopic -Urine, Clean Catch  Result Value Ref Range   Color, Urine STRAW (A) YELLOW   APPearance CLEAR (A) CLEAR   Specific Gravity, Urine >1.046 (H) 1.005 - 1.030   pH 6.0 5.0 - 8.0   Glucose, UA NEGATIVE NEGATIVE mg/dL   Hgb urine dipstick NEGATIVE NEGATIVE   Bilirubin Urine NEGATIVE NEGATIVE   Ketones, ur NEGATIVE NEGATIVE mg/dL   Protein, ur NEGATIVE NEGATIVE mg/dL   Nitrite NEGATIVE NEGATIVE   Leukocytes,Ua NEGATIVE NEGATIVE  Hemoglobin A1c  Result Value Ref Range   Hgb A1c MFr Bld 5.8 (H) 4.8 - 5.6 %   Mean Plasma Glucose 119.76 mg/dL  HIV Antibody (routine testing w rflx)  Result Value Ref Range   HIV Screen 4th Generation wRfx Non Reactive Non Reactive  Glucose,  capillary  Result Value Ref Range   Glucose-Capillary 102 (H) 70 - 99 mg/dL  Glucose, capillary  Result Value Ref Range   Glucose-Capillary 113 (H) 70 - 99 mg/dL  Glucose, capillary  Result Value Ref Range   Glucose-Capillary 104 (H) 70 - 99 mg/dL  Glucose, capillary  Result Value Ref Range   Glucose-Capillary 165 (H) 70 - 99 mg/dL  Glucose, capillary  Result  Value Ref Range   Glucose-Capillary 151 (H) 70 - 99 mg/dL  CBC  Result Value Ref Range   WBC 9.6 4.0 - 10.5 K/uL   RBC 3.73 (L) 4.22 - 5.81 MIL/uL   Hemoglobin 12.3 (L) 13.0 - 17.0 g/dL   HCT 95.2 (L) 84.1 - 32.4 %   MCV 95.4 80.0 - 100.0 fL   MCH 33.0 26.0 - 34.0 pg   MCHC 34.6 30.0 - 36.0 g/dL   RDW 40.1 02.7 - 25.3 %   Platelets 226 150 - 400 K/uL   nRBC 0.0 0.0 - 0.2 %  Comprehensive metabolic panel  Result Value Ref Range   Sodium 136 135 - 145 mmol/L   Potassium 4.0 3.5 - 5.1 mmol/L   Chloride 108 98 - 111 mmol/L   CO2 22 22 - 32 mmol/L   Glucose, Bld 115 (H) 70 - 99 mg/dL   BUN 25 (H) 6 - 20 mg/dL   Creatinine, Ser 6.64 (H) 0.61 - 1.24 mg/dL   Calcium 7.3 (L) 8.9 - 10.3 mg/dL   Total Protein 6.0 (L) 6.5 - 8.1 g/dL   Albumin 3.2 (L) 3.5 - 5.0 g/dL   AST 20 15 - 41 U/L   ALT 28 0 - 44 U/L   Alkaline Phosphatase 43 38 - 126 U/L   Total Bilirubin 0.6 0.3 - 1.2 mg/dL   GFR, Estimated 56 (L) >60 mL/min   Anion gap 6 5 - 15  Glucose, capillary  Result Value Ref Range   Glucose-Capillary 129 (H) 70 - 99 mg/dL  Glucose, capillary  Result Value Ref Range   Glucose-Capillary 142 (H) 70 - 99 mg/dL  Glucose, capillary  Result Value Ref Range   Glucose-Capillary 120 (H) 70 - 99 mg/dL  Glucose, capillary  Result Value Ref Range   Glucose-Capillary 102 (H) 70 - 99 mg/dL      Assessment & Plan:   Problem List Items Addressed This Visit       Cardiovascular and Mediastinum   Hypertension     Endocrine   Type 2 diabetes mellitus with diabetic neuropathy, unspecified (HCC)     Other   Hyperlipidemia    Major depression, recurrent (HCC)   Bipolar disorder (HCC) - Primary   Morbid obesity (HCC)     Follow up plan: No follow-ups on file.

## 2023-05-26 ENCOUNTER — Other Ambulatory Visit: Payer: Self-pay | Admitting: Nurse Practitioner

## 2023-05-28 NOTE — Telephone Encounter (Signed)
[facility] Medication Refill Request  Last non-acute appointment needs to be within 12 months YES   Last office visit at [facility]: 04/22/2023   Next office visit at [facility]: Visit date not found

## 2023-05-28 NOTE — Telephone Encounter (Signed)
Requested medication (s) are due for refill today:   Yes  Requested medication (s) are on the active medication list:   Yes  Future visit scheduled:   No   Last ordered: 04/15/2022 #270, 1 refill  Unable to refill because PHQ 2 or 9 is needed per protocol    Lab is in date.   Requested Prescriptions  Pending Prescriptions Disp Refills   gabapentin (NEURONTIN) 600 MG tablet [Pharmacy Med Name: GABAPENTIN 600 MG TAB] 270 tablet 1    Sig: TAKE 1 TABLET BY MOUTH 3 TIMES DAILY AS NEEDED     Neurology: Anticonvulsants - gabapentin Failed - 05/26/2023  1:19 PM      Failed - Cr in normal range and within 360 days    Creatinine, Ser  Date Value Ref Range Status  01/27/2023 1.46 (H) 0.61 - 1.24 mg/dL Final         Failed - Completed PHQ-2 or PHQ-9 in the last 360 days      Passed - Valid encounter within last 12 months    Recent Outpatient Visits           3 months ago S/P laparoscopic appendectomy   [place] [facility] [place], Sherran Needs, NP   7 months ago Primary hypertension   [place] [facility] Larae Grooms, NP   10 months ago Primary hypertension   [place] [facility] Larae Grooms, NP   1 year ago Annual physical exam   [place] [facility] Larae Grooms, NP   1 year ago Type 2 diabetes mellitus with diabetic neuropathy, with long-term current use of insulin (HCC)    [facility] Loura Pardon, MD

## 2023-06-13 ENCOUNTER — Other Ambulatory Visit: Payer: Self-pay | Admitting: Nurse Practitioner

## 2023-06-16 NOTE — Telephone Encounter (Signed)
Requested Prescriptions  Pending Prescriptions Disp Refills   OZEMPIC, 0.25 OR 0.5 MG/DOSE, 2 MG/3ML SOPN [Pharmacy Med Name: OZEMPIC (0.25 OR 0.5 MG/DOSE) 2 MG/] 6 mL 0    Sig: INJECT 0.5MG  SUBCUTANEOUSLY ONCE A WEEK     Endocrinology:  Diabetes - GLP-1 Receptor Agonists - semaglutide Failed - 06/16/2023  8:12 AM      Failed - HBA1C in normal range and within 180 days    HB A1C (BAYER DCA - WAIVED)  Date Value Ref Range Status  10/08/2021 5.2 4.8 - 5.6 % Final    Comment:             Prediabetes: 5.7 - 6.4          Diabetes: >6.4          Glycemic control for adults with diabetes: <7.0    Hgb A1c MFr Bld  Date Value Ref Range Status  01/26/2023 5.8 (H) 4.8 - 5.6 % Final    Comment:    (NOTE) Pre diabetes:          5.7%-6.4%  Diabetes:              >6.4%  Glycemic control for   <7.0% adults with diabetes          Failed - Cr in normal range and within 360 days    Creatinine, Ser  Date Value Ref Range Status  01/27/2023 1.46 (H) 0.61 - 1.24 mg/dL Final         Passed - Valid encounter within last 6 months    Recent Outpatient Visits           4 months ago S/P laparoscopic appendectomy   [place] [facility], Sherran Needs, NP   8 months ago Primary hypertension   [place] [facility] Larae Grooms, NP   10 months ago Primary hypertension   [place] [facility] Larae Grooms, NP   1 year ago Annual physical exam   [place] [facility] Larae Grooms, NP   1 year ago Type 2 diabetes mellitus with diabetic neuropathy, with long-term current use of insulin (HCC)    [facility] Vigg, Avanti, MD       Future Appointments             In 2 days Larae Grooms, NP  [facility]

## 2023-06-18 ENCOUNTER — Ambulatory Visit: Payer: MEDICAID | Admitting: Nurse Practitioner

## 2023-06-18 ENCOUNTER — Telehealth: Payer: Self-pay

## 2023-06-18 ENCOUNTER — Encounter: Payer: Self-pay | Admitting: Nurse Practitioner

## 2023-06-18 VITALS — BP 134/84 | HR 78 | Temp 97.8°F | Ht 72.0 in | Wt 218.0 lb

## 2023-06-18 DIAGNOSIS — E782 Mixed hyperlipidemia: Secondary | ICD-10-CM

## 2023-06-18 DIAGNOSIS — E1140 Type 2 diabetes mellitus with diabetic neuropathy, unspecified: Secondary | ICD-10-CM | POA: Diagnosis not present

## 2023-06-18 DIAGNOSIS — Z7985 Long-term (current) use of injectable non-insulin antidiabetic drugs: Secondary | ICD-10-CM

## 2023-06-18 DIAGNOSIS — Z0000 Encounter for general adult medical examination without abnormal findings: Secondary | ICD-10-CM

## 2023-06-18 DIAGNOSIS — E785 Hyperlipidemia, unspecified: Secondary | ICD-10-CM

## 2023-06-18 DIAGNOSIS — Z599 Problem related to housing and economic circumstances, unspecified: Secondary | ICD-10-CM

## 2023-06-18 DIAGNOSIS — I10 Essential (primary) hypertension: Secondary | ICD-10-CM

## 2023-06-18 DIAGNOSIS — F3132 Bipolar disorder, current episode depressed, moderate: Secondary | ICD-10-CM

## 2023-06-18 DIAGNOSIS — F17200 Nicotine dependence, unspecified, uncomplicated: Secondary | ICD-10-CM

## 2023-06-18 DIAGNOSIS — F331 Major depressive disorder, recurrent, moderate: Secondary | ICD-10-CM

## 2023-06-18 DIAGNOSIS — F17210 Nicotine dependence, cigarettes, uncomplicated: Secondary | ICD-10-CM

## 2023-06-18 DIAGNOSIS — M5442 Lumbago with sciatica, left side: Secondary | ICD-10-CM

## 2023-06-18 DIAGNOSIS — Z794 Long term (current) use of insulin: Secondary | ICD-10-CM

## 2023-06-18 DIAGNOSIS — M5441 Lumbago with sciatica, right side: Secondary | ICD-10-CM

## 2023-06-18 DIAGNOSIS — G8929 Other chronic pain: Secondary | ICD-10-CM

## 2023-06-18 LAB — MICROSCOPIC EXAMINATION: Bacteria, UA: NONE SEEN

## 2023-06-18 LAB — URINALYSIS, ROUTINE W REFLEX MICROSCOPIC
Bilirubin, UA: NEGATIVE
Glucose, UA: NEGATIVE
Ketones, UA: NEGATIVE
Leukocytes,UA: NEGATIVE
Nitrite, UA: NEGATIVE
Protein,UA: NEGATIVE
Specific Gravity, UA: >1.03 — ABNORMAL HIGH (ref 1.005–1.030)
Urobilinogen, Ur: 0.2 mg/dL (ref 0.2–1.0)
pH, UA: 5.5 (ref 5.0–7.5)

## 2023-06-18 LAB — MICROALBUMIN, URINE WAIVED
Creatinine, Urine Waived: 300 mg/dL (ref 10–300)
Microalb, Ur Waived: 80 mg/L — ABNORMAL HIGH (ref 0–19)

## 2023-06-18 MED ORDER — OZEMPIC (0.25 OR 0.5 MG/DOSE) 2 MG/3ML ~~LOC~~ SOPN: 0.5000 mg | PEN_INJECTOR | SUBCUTANEOUS | 1 refills | Status: DC

## 2023-06-18 MED ORDER — DAPAGLIFLOZIN PROPANEDIOL 10 MG PO TABS: 10.0000 mg | ORAL_TABLET | Freq: Every day | ORAL | 1 refills | Status: DC

## 2023-06-18 MED ORDER — VALSARTAN 80 MG PO TABS: 80.0000 mg | ORAL_TABLET | Freq: Every day | ORAL | 1 refills | Status: DC

## 2023-06-18 MED ORDER — METOPROLOL SUCCINATE ER 25 MG PO TB24: 25.0000 mg | ORAL_TABLET | Freq: Every day | ORAL | 1 refills | Status: DC

## 2023-06-18 MED ORDER — ATORVASTATIN CALCIUM 40 MG PO TABS: 40.0000 mg | ORAL_TABLET | Freq: Every day | ORAL | 1 refills | Status: DC

## 2023-06-18 NOTE — Progress Notes (Signed)
BP 134/84   Pulse 78   Temp 97.8 F (36.6 C) (Oral)   Ht 6' (1.829 m)   Wt 218 lb (98.9 kg)   SpO2 98%   BMI 29.57 kg/m    Subjective:    Patient ID: Lance Vaughan, male    DOB: 17-Nov-1965, 57 y.o.   MRN: [id number]  HPI: Lance Vaughan is a 57 y.o. male presenting on 06/18/2023 for comprehensive medical examination. Current medical complaints include:none  He currently lives with: Interim Problems from his last visit: no  HYPERTENSION / HYPERLIPIDEMIA Satisfied with current treatment? no Duration of hypertension: years BP monitoring frequency: not checking BP range:  BP medication side effects: no Past BP meds: amlodipine Duration of hyperlipidemia: years Cholesterol medication side effects: no Cholesterol supplements: none Past cholesterol medications: atorvastain (lipitor) Medication compliance: excellent compliance Aspirin: yes Recent stressors: no Recurrent headaches: no Visual changes: no Palpitations: no Dyspnea: yes Chest pain: no Lower extremity edema: no Dizzy/lightheaded: no  DIABETES Hypoglycemic episodes:no Polydipsia/polyuria: no Visual disturbance: no Chest pain: no Paresthesias: yes Glucose Monitoring: yes  Accucheck frequency: Daily  Fasting glucose: 118  Post prandial:  Evening:  Before meals: Taking Insulin?: no  Long acting insulin:  Short acting insulin: Blood Pressure Monitoring: daily Retinal Examination: Not up to Date Foot Exam: Up to Date Diabetic Education: Not Completed Pneumovax: Up to Date Influenza: Not up to Date Aspirin: yes  Patient states his hips are hurting on both sides.  It hurts all the way down through his legs to his feet.  He is also having back pain.  He is taking 8 tylenol per day.  Sometimes he is taking more than that because his pain isn't well controlled.  He is having trouble sleeping because of the pain.  States the gabapentin didn't help his pain at all.  He hasn't seen anyone else for his pain.  Has  trouble with transportation to see a specialist.   Depression Screen done today and results listed below:     07/23/2022    1:32 PM 04/15/2022    1:11 PM 09/10/2020    1:35 PM 07/26/2018    1:20 PM 04/15/2018    2:26 PM  Depression screen PHQ 2/9  Decreased Interest  1 0 3 1  Down, Depressed, Hopeless  1 2 3 1   PHQ - 2 Score  2 2 6 2   Altered sleeping  3 2 3 2   Tired, decreased energy  3 2 3 3   Change in appetite  2 2 2 3   Feeling bad or failure about yourself   0 2 3 1   Trouble concentrating  0 2 0 2  Moving slowly or fidgety/restless  2 2 3 3   Suicidal thoughts  1 1 0 0  PHQ-9 Score  13 15 20 16   Difficult doing work/chores Not difficult at all Somewhat difficult Somewhat difficult Extremely dIfficult     The patient did a history of falls. I did complete a risk assessment for falls. A plan of care for falls was documented.   Past Medical History:  Past Medical History:  Diagnosis Date   Acute appendicitis 01/25/2023   Arthritis    Bipolar disorder (HCC)    Bursitis    Depression    Diabetes mellitus without complication (HCC)    Diverticulitis    Hyperlipidemia    Hypertension    Psychosis (HCC)    Psychosis (HCC)     Surgical History:  Past Surgical History:  Procedure Laterality Date  COLONOSCOPY WITH PROPOFOL N/A 05/03/2018   Procedure: COLONOSCOPY WITH PROPOFOL;  Surgeon: Toney Reil, MD;  Location: [facility] ENDOSCOPY;  Service: Gastroenterology;  Laterality: N/A;   HEMORRHOID SURGERY N/A 07/06/2019   Procedure: HEMORRHOIDECTOMY;  Surgeon: [name], MD;  Location: [facility] ORS;  Service: General;  Laterality: N/A;   LAPAROSCOPIC APPENDECTOMY N/A 01/26/2023   Procedure: APPENDECTOMY LAPAROSCOPIC;  Surgeon: Leafy Ro, MD;  Location: [facility] ORS;  Service: General;  Laterality: N/A;   RECTAL EXAM UNDER ANESTHESIA N/A 07/06/2019   Procedure: RECTAL EXAM UNDER ANESTHESIA;  Surgeon: [name], MD;  Location: [facility] ORS;  Service: General;  Laterality:  N/A;    Medications:  Current Outpatient Medications on File Prior to Visit  Medication Sig   Accu-Chek Softclix Lancets lancets 1 each by Other route daily. Use as instructed   albuterol (VENTOLIN HFA) 108 (90 Base) MCG/ACT inhaler Inhale 2 puffs into the lungs every 6 (six) hours as needed for wheezing or shortness of breath. Rescue inhaler   aspirin EC 81 MG tablet Take 1 tablet (81 mg total) by mouth daily.   Blood Glucose Monitoring Suppl (ACCU-CHEK AVIVA PLUS) w/Device KIT 1 each by Does not apply route daily.   glucose blood (ACCU-CHEK AVIVA PLUS) test strip TEST TWICE DAILY   polyethylene glycol powder (GLYCOLAX/MIRALAX) 17 GM/SCOOP powder Take 17 g by mouth 2 (two) times daily as needed.   ULTRACARE PEN NEEDLES 32G X 4 MM MISC USE 3 TIMES DAILY AS NEEDED   No current facility-administered medications on file prior to visit.    Allergies:  Allergies  Allergen Reactions   Lisinopril Other (See Comments)    Chest pain    Social History:  Social History   Socioeconomic History   Marital status: Single    Spouse name: Not on file   Number of children: 4   Years of education: Not on file   Highest education level: GED or equivalent  Occupational History   Occupation: disability  Tobacco Use   Smoking status: Every Day    Current packs/day: 2.00    Average packs/day: 2.0 packs/day for 30.0 years (60.0 ttl pk-yrs)    Types: Cigarettes   Smokeless tobacco: Never  Vaping Use   Vaping status: Never Used  Substance and Sexual Activity   Alcohol use: Not Currently   Drug use: No   Sexual activity: Yes  Other Topics Concern   Not on file  Social History Narrative   Patient is currently living with his ex-wife, but is looking for other housing options.  He gets food when he has a ride to the foodbank.   Social Drivers of Corporate investment banker Strain: High Risk (11/19/2017)   Overall Financial Resource Strain (CARDIA)    Difficulty of Paying Living Expenses: Very  hard  Food Insecurity: Food Insecurity Present (01/26/2023)   Hunger Vital Sign    Worried About Running Out of Food in the Last Year: Sometimes true    Ran Out of Food in the Last Year: Sometimes true  Transportation Needs: No Transportation Needs (01/26/2023)   PRAPARE - Administrator, Civil Service (Medical): No    Lack of Transportation (Non-Medical): No  Physical Activity: Insufficiently Active (11/19/2017)   Exercise Vital Sign    Days of Exercise per Week: 3 days    Minutes of Exercise per Session: 10 min  Stress: Stress Concern Present (11/19/2017)   [employer] of Occupational Health - Occupational Stress Questionnaire    Feeling  of Stress : Rather much  Social Connections: Socially Isolated (11/19/2017)   Social Connection and Isolation Panel [NHANES]    Frequency of Communication with Friends and Family: Once a week    Frequency of Social Gatherings with Friends and Family: Never    Attends Religious Services: Never    Database administrator or Organizations: No    Attends Banker Meetings: Never    Marital Status: Separated  Intimate Partner Violence: Not At Risk (01/26/2023)   Humiliation, Afraid, Rape, and Kick questionnaire    Fear of Current or Ex-Partner: No    Emotionally Abused: No    Physically Abused: No    Sexually Abused: No   Social History   Tobacco Use  Smoking Status Every Day   Current packs/day: 2.00   Average packs/day: 2.0 packs/day for 30.0 years (60.0 ttl pk-yrs)   Types: Cigarettes  Smokeless Tobacco Never   Social History   Substance and Sexual Activity  Alcohol Use Not Currently    Family History:  Family History  Problem Relation Age of Onset   Hypertension Mother    Hypertension Sister    Hyperlipidemia Sister    Hyperlipidemia Sister    Hypertension Sister    Diabetes Maternal Grandmother     Past medical history, surgical history, medications, allergies, family history and social history reviewed  with patient today and changes made to appropriate areas of the chart.   Review of Systems  Eyes:  Negative for blurred vision and double vision.  Respiratory:  Negative for shortness of breath.   Cardiovascular:  Negative for chest pain, palpitations and leg swelling.  Musculoskeletal:  Positive for back pain.       Hip pain and leg pain  Neurological:  Negative for dizziness and headaches.   All other ROS negative except what is listed above and in the HPI.      Objective:    BP 134/84   Pulse 78   Temp 97.8 F (36.6 C) (Oral)   Ht 6' (1.829 m)   Wt 218 lb (98.9 kg)   SpO2 98%   BMI 29.57 kg/m   Wt Readings from Last 3 Encounters:  06/18/23 218 lb (98.9 kg)  02/02/23 222 lb (100.7 kg)  02/02/23 222 lb 6.4 oz (100.9 kg)    Physical Exam Vitals and nursing note reviewed.  Constitutional:      General: He is not in acute distress.    Appearance: Normal appearance. He is obese. He is not ill-appearing, toxic-appearing or diaphoretic.  HENT:     Head: Normocephalic.     Right Ear: Tympanic membrane, ear canal and external ear normal.     Left Ear: Tympanic membrane, ear canal and external ear normal.     Nose: Nose normal. No congestion or rhinorrhea.     Mouth/Throat:     Mouth: Mucous membranes are moist.  Eyes:     General:        Right eye: No discharge.        Left eye: No discharge.     Extraocular Movements: Extraocular movements intact.     Conjunctiva/sclera: Conjunctivae normal.     Pupils: Pupils are equal, round, and reactive to light.  Cardiovascular:     Rate and Rhythm: Normal rate and regular rhythm.     Heart sounds: No murmur heard. Pulmonary:     Effort: Pulmonary effort is normal. No respiratory distress.     Breath sounds: Normal breath sounds. No  wheezing, rhonchi or rales.  Abdominal:     General: Abdomen is flat. Bowel sounds are normal. There is no distension.     Palpations: Abdomen is soft.     Tenderness: There is no abdominal  tenderness. There is no guarding.  Musculoskeletal:     Cervical back: Normal range of motion and neck supple.  Skin:    General: Skin is warm and dry.     Capillary Refill: Capillary refill takes less than 2 seconds.  Neurological:     General: No focal deficit present.     Mental Status: He is alert and oriented to person, place, and time.     Cranial Nerves: No cranial nerve deficit.     Motor: No weakness.     Deep Tendon Reflexes: Reflexes normal.  Psychiatric:        Mood and Affect: Mood normal.        Behavior: Behavior normal.        Thought Content: Thought content normal.        Judgment: Judgment normal.     Results for orders placed or performed during the hospital encounter of 01/25/23  Lipase, blood   Collection Time: 01/25/23  3:29 PM  Result Value Ref Range   Lipase 29 11 - 51 U/L  Comprehensive metabolic panel   Collection Time: 01/25/23  3:29 PM  Result Value Ref Range   Sodium 139 135 - 145 mmol/L   Potassium 3.8 3.5 - 5.1 mmol/L   Chloride 104 98 - 111 mmol/L   CO2 25 22 - 32 mmol/L   Glucose, Bld 132 (H) 70 - 99 mg/dL   BUN 11 6 - 20 mg/dL   Creatinine, Ser 6.29 0.61 - 1.24 mg/dL   Calcium 9.0 8.9 - 52.8 mg/dL   Total Protein 8.0 6.5 - 8.1 g/dL   Albumin 4.3 3.5 - 5.0 g/dL   AST 32 15 - 41 U/L   ALT 40 0 - 44 U/L   Alkaline Phosphatase 65 38 - 126 U/L   Total Bilirubin 0.4 0.3 - 1.2 mg/dL   GFR, Estimated >41 >32 mL/min   Anion gap 10 5 - 15  CBC   Collection Time: 01/25/23  3:29 PM  Result Value Ref Range   WBC 6.7 4.0 - 10.5 K/uL   RBC 4.93 4.22 - 5.81 MIL/uL   Hemoglobin 16.0 13.0 - 17.0 g/dL   HCT 44.0 10.2 - 72.5 %   MCV 95.9 80.0 - 100.0 fL   MCH 32.5 26.0 - 34.0 pg   MCHC 33.8 30.0 - 36.0 g/dL   RDW 36.6 44.0 - 34.7 %   Platelets 277 150 - 400 K/uL   nRBC 0.0 0.0 - 0.2 %  Urinalysis, Routine w reflex microscopic -Urine, Clean Catch   Collection Time: 01/25/23  7:20 PM  Result Value Ref Range   Color, Urine STRAW (A) YELLOW    APPearance CLEAR (A) CLEAR   Specific Gravity, Urine >1.046 (H) 1.005 - 1.030   pH 6.0 5.0 - 8.0   Glucose, UA NEGATIVE NEGATIVE mg/dL   Hgb urine dipstick NEGATIVE NEGATIVE   Bilirubin Urine NEGATIVE NEGATIVE   Ketones, ur NEGATIVE NEGATIVE mg/dL   Protein, ur NEGATIVE NEGATIVE mg/dL   Nitrite NEGATIVE NEGATIVE   Leukocytes,Ua NEGATIVE NEGATIVE  Glucose, capillary   Collection Time: 01/25/23  8:55 PM  Result Value Ref Range   Glucose-Capillary 102 (H) 70 - 99 mg/dL  Hemoglobin Q2V   Collection Time: 01/26/23  3:46  AM  Result Value Ref Range   Hgb A1c MFr Bld 5.8 (H) 4.8 - 5.6 %   Mean Plasma Glucose 119.76 mg/dL  HIV Antibody (routine testing w rflx)   Collection Time: 01/26/23  3:46 AM  Result Value Ref Range   HIV Screen 4th Generation wRfx Non Reactive Non Reactive  Glucose, capillary   Collection Time: 01/26/23  7:51 AM  Result Value Ref Range   Glucose-Capillary 113 (H) 70 - 99 mg/dL  Glucose, capillary   Collection Time: 01/26/23 10:32 AM  Result Value Ref Range   Glucose-Capillary 104 (H) 70 - 99 mg/dL  Glucose, capillary   Collection Time: 01/26/23 12:20 PM  Result Value Ref Range   Glucose-Capillary 165 (H) 70 - 99 mg/dL  Glucose, capillary   Collection Time: 01/26/23  2:00 PM  Result Value Ref Range   Glucose-Capillary 151 (H) 70 - 99 mg/dL  Glucose, capillary   Collection Time: 01/26/23  5:17 PM  Result Value Ref Range   Glucose-Capillary 129 (H) 70 - 99 mg/dL  Glucose, capillary   Collection Time: 01/26/23  9:00 PM  Result Value Ref Range   Glucose-Capillary 142 (H) 70 - 99 mg/dL  CBC   Collection Time: 01/27/23  3:55 AM  Result Value Ref Range   WBC 9.6 4.0 - 10.5 K/uL   RBC 3.73 (L) 4.22 - 5.81 MIL/uL   Hemoglobin 12.3 (L) 13.0 - 17.0 g/dL   HCT 16.1 (L) 09.6 - 04.5 %   MCV 95.4 80.0 - 100.0 fL   MCH 33.0 26.0 - 34.0 pg   MCHC 34.6 30.0 - 36.0 g/dL   RDW 40.9 81.1 - 91.4 %   Platelets 226 150 - 400 K/uL   nRBC 0.0 0.0 - 0.2 %  Comprehensive  metabolic panel   Collection Time: 01/27/23  3:55 AM  Result Value Ref Range   Sodium 136 135 - 145 mmol/L   Potassium 4.0 3.5 - 5.1 mmol/L   Chloride 108 98 - 111 mmol/L   CO2 22 22 - 32 mmol/L   Glucose, Bld 115 (H) 70 - 99 mg/dL   BUN 25 (H) 6 - 20 mg/dL   Creatinine, Ser 7.82 (H) 0.61 - 1.24 mg/dL   Calcium 7.3 (L) 8.9 - 10.3 mg/dL   Total Protein 6.0 (L) 6.5 - 8.1 g/dL   Albumin 3.2 (L) 3.5 - 5.0 g/dL   AST 20 15 - 41 U/L   ALT 28 0 - 44 U/L   Alkaline Phosphatase 43 38 - 126 U/L   Total Bilirubin 0.6 0.3 - 1.2 mg/dL   GFR, Estimated 56 (L) >60 mL/min   Anion gap 6 5 - 15  Glucose, capillary   Collection Time: 01/27/23  8:07 AM  Result Value Ref Range   Glucose-Capillary 120 (H) 70 - 99 mg/dL  Glucose, capillary   Collection Time: 01/27/23 12:08 PM  Result Value Ref Range   Glucose-Capillary 102 (H) 70 - 99 mg/dL      Assessment & Plan:   Problem List Items Addressed This Visit       Cardiovascular and Mediastinum   Hypertension   Chronic.  Controlled.  Continue with current medication regimen of Amlodipine, Valsartan and Metoprolol.  Refills sent today.  Labs ordered today.  Return to clinic in 6 months for reevaluation.  Call sooner if concerns arise.       Relevant Medications   atorvastatin (LIPITOR) 40 MG tablet   metoprolol succinate (TOPROL-XL) 25 MG 24 hr tablet  valsartan (DIOVAN) 80 MG tablet     Endocrine   Type 2 diabetes mellitus with diabetic neuropathy, unspecified (HCC)   Chronic.  Controlled.  Last A1c in October was 6.2%.  On Ozempic and [place] and doing well. Aware he needs an updated eye exam.  Followed by Podiatry. Labs ordered today.  Follow up in 6 months.  Call sooner if concerns arise.        Relevant Medications   atorvastatin (LIPITOR) 40 MG tablet   dapagliflozin propanediol (FARXIGA) 10 MG TABS tablet   Semaglutide,0.25 or 0.5MG /DOS, (OZEMPIC, 0.25 OR 0.5 MG/DOSE,) 2 MG/3ML SOPN   valsartan (DIOVAN) 80 MG tablet   Other  Relevant Orders   Microalbumin, Urine Waived   HgB A1c     Other   Hyperlipidemia   Chronic.  Controlled.  Continue with current medication regimen of atorvastatin 40mg  daily.  Refills sent today.  Labs ordered today.  Return to clinic in 6 months for reevaluation.  Call sooner if concerns arise.       Relevant Medications   atorvastatin (LIPITOR) 40 MG tablet   metoprolol succinate (TOPROL-XL) 25 MG 24 hr tablet   valsartan (DIOVAN) 80 MG tablet   Other Relevant Orders   Lipid panel   Major depression, recurrent (HCC)   Chronic. Not currently on medication.  Does not feel like he needs it.  Does not want a referral to psychiatry at this time.        Bipolar disorder (HCC)   Chronic. Not currently on medication.  Does not feel like he needs it.  Does not want a referral to psychiatry at this time.        Morbid obesity (HCC)   Recommended eating smaller high protein, low fat meals more frequently and exercising 30 mins a day 5 times a week with a goal of 10-15lb weight loss in the next 3 months.  He has lost 4lbs since last visit with Ozempic.      Relevant Medications   dapagliflozin propanediol (FARXIGA) 10 MG TABS tablet   Semaglutide,0.25 or 0.5MG /DOS, (OZEMPIC, 0.25 OR 0.5 MG/DOSE,) 2 MG/3ML SOPN   Current every day smoker   Needs CT Lung screening- working to get patient reliable transportation.      Other Visit Diagnoses       Annual physical exam    -  Primary   Health maintenance reviewed during visit today.  Labs ordered.  Vaccines reviewed.  Colonoscopy up to date.  Needs CT lung screening- working on transportation.   Relevant Orders   TSH   PSA   Lipid panel   CBC with Differential/Platelet   Comprehensive metabolic panel   Urinalysis, Routine w reflex microscopic   Microalbumin, Urine Waived   HgB A1c     Financial difficulties       Patient doesn't drive. Needs help with transportation. States [place] transportation is unreliable.   Relevant Orders   AMB  Referral [facility] Care Management     Chronic bilateral low back pain with bilateral sciatica     Ongoing back pain. Counseled patient on proper use of Tylenol. No relief with gabapentin. Need xrays and evaluation with Ortho. Patient has difficulty with transportation. Working with Child psychotherapist to help get transportation for patient.          Discussed aspirin prophylaxis for myocardial infarction prevention and decision was made to continue ASA  LABORATORY TESTING:  Health maintenance labs ordered today as discussed above.   The natural history  of prostate cancer and ongoing controversy regarding screening and potential treatment outcomes of prostate cancer has been discussed with the patient. The meaning of a false positive PSA and a false negative PSA has been discussed. He indicates understanding of the limitations of this screening test and wishes to proceed with screening PSA testing.   IMMUNIZATIONS:   - Tdap: Tetanus vaccination status reviewed: last tetanus booster within 10 years. - Influenza: Refused - Pneumovax: Up to date - Prevnar: Not applicable - COVID: Not applicable - HPV: Not applicable - Shingrix vaccine:  Discussed at visit today. Would like to hold off  SCREENING: - Colonoscopy: Up to date  Discussed with patient purpose of the colonoscopy is to detect colon cancer at curable precancerous or early stages   - AAA Screening: Not applicable  -Hearing Test: Not applicable  -Spirometry: Not applicable  -Needs CT Lung- working to help patient get transportation  PATIENT COUNSELING:    Sexuality: Discussed sexually transmitted diseases, partner selection, use of condoms, avoidance of unintended pregnancy  and contraceptive alternatives.   Advised to avoid cigarette smoking.  I discussed with the patient that most people either abstain from alcohol or drink within safe limits (<=14/week and <=4 drinks/occasion for males, <=7/weeks and <= 3 drinks/occasion for  females) and that the risk for alcohol disorders and other health effects rises proportionally with the number of drinks per week and how often a drinker exceeds daily limits.  Discussed cessation/primary prevention of drug use and availability of treatment for abuse.   Diet: Encouraged to adjust caloric intake to maintain  or achieve ideal body weight, to reduce intake of dietary saturated fat and total fat, to limit sodium intake by avoiding high sodium foods and not adding table salt, and to maintain adequate dietary potassium and calcium preferably from fresh fruits, vegetables, and low-fat dairy products.    stressed the importance of regular exercise  Injury prevention: Discussed safety belts, safety helmets, smoke detector, smoking near bedding or upholstery.   Dental health: Discussed importance of regular tooth brushing, flossing, and dental visits.   Follow up plan: NEXT PREVENTATIVE PHYSICAL DUE IN 1 YEAR. Return in about 3 months (around 09/16/2023) for HTN, HLD, DM2 FU.

## 2023-06-18 NOTE — Assessment & Plan Note (Signed)
Chronic.  Controlled.  Continue with current medication regimen of Amlodipine, Valsartan and Metoprolol.  Refills sent today.  Labs ordered today.  Return to clinic in 6 months for reevaluation.  Call sooner if concerns arise.

## 2023-06-18 NOTE — Assessment & Plan Note (Signed)
Chronic. Not currently on medication.  Does not feel like he needs it.  Does not want a referral to psychiatry at this time.   

## 2023-06-18 NOTE — Assessment & Plan Note (Signed)
Chronic.  Controlled.  Continue with current medication regimen of atorvastatin 40mg  daily.  Refills sent today.  Labs ordered today.  Return to clinic in 6 months for reevaluation.  Call sooner if concerns arise.

## 2023-06-18 NOTE — Assessment & Plan Note (Signed)
Chronic.  Controlled.  Last A1c in October was 6.2%.  On Ozempic and [place] and doing well. Aware he needs an updated eye exam.  Followed by Podiatry. Labs ordered today.  Follow up in 6 months.  Call sooner if concerns arise.

## 2023-06-18 NOTE — Progress Notes (Signed)
   Telephone encounter was:  Successful.  Complex Care Management Note Care Guide Note  06/18/2023 Name: Lance Vaughan MRN: [id number] DOB: [birth year]  Lance Vaughan is a 57 y.o. year old male who is a primary care patient of Larae Grooms, NP . The community resource team was consulted for assistance with Financial Difficulties related to Financial Strain  SDOH screenings and interventions completed:  No     Care guide performed the following interventions: Patient provided with information about care guide support team and interviewed to confirm resource needs.Patient refused information and resources I was offering to help with and hung up before completeing all SDOH needs   Follow Up Plan:  No further follow up planned at this time. The patient has been provided with needed resources.  Encounter Outcome:  Patient Refused   Lenard Forth [facility]  [facility], [facility] Guide, Phone: [phone] Website: [email or web address]

## 2023-06-18 NOTE — Assessment & Plan Note (Signed)
Recommended eating smaller high protein, low fat meals more frequently and exercising 30 mins a day 5 times a week with a goal of 10-15lb weight loss in the next 3 months.  He has lost 4lbs since last visit with Ozempic.

## 2023-06-18 NOTE — Assessment & Plan Note (Signed)
Needs CT Lung screening- working to get patient reliable transportation.

## 2023-06-19 LAB — LIPID PANEL
Chol/HDL Ratio: 3 {ratio} (ref 0.0–5.0)
Cholesterol, Total: 156 mg/dL (ref 100–199)
HDL: 52 mg/dL (ref >39)
LDL Chol Calc (NIH): 81 mg/dL (ref 0–99)
Triglycerides: 131 mg/dL (ref 0–149)
VLDL Cholesterol Cal: 23 mg/dL (ref 5–40)

## 2023-06-19 LAB — COMPREHENSIVE METABOLIC PANEL
ALT: 39 [IU]/L (ref 0–44)
AST: 41 [IU]/L — ABNORMAL HIGH (ref 0–40)
Albumin: 4.3 g/dL (ref 3.8–4.9)
Alkaline Phosphatase: 69 [IU]/L (ref 44–121)
BUN/Creatinine Ratio: 15 (ref 9–20)
BUN: 19 mg/dL (ref 6–24)
Bilirubin Total: 0.3 mg/dL (ref 0.0–1.2)
CO2: 21 mmol/L (ref 20–29)
Calcium: 9.2 mg/dL (ref 8.7–10.2)
Chloride: 106 mmol/L (ref 96–106)
Creatinine, Ser: 1.25 mg/dL (ref 0.76–1.27)
Globulin, Total: 2.5 g/dL (ref 1.5–4.5)
Glucose: 105 mg/dL — ABNORMAL HIGH (ref 70–99)
Potassium: 4.1 mmol/L (ref 3.5–5.2)
Sodium: 141 mmol/L (ref 134–144)
Total Protein: 6.8 g/dL (ref 6.0–8.5)
eGFR: 67 mL/min/{1.73_m2} (ref >59)

## 2023-06-19 LAB — CBC WITH DIFFERENTIAL/PLATELET
Basophils Absolute: 0 10*3/uL (ref 0.0–0.2)
Basos: 1 %
EOS (ABSOLUTE): 0.4 10*3/uL (ref 0.0–0.4)
Eos: 8 %
Hematocrit: 48.5 % (ref 37.5–51.0)
Hemoglobin: 15.8 g/dL (ref 13.0–17.7)
Immature Grans (Abs): 0 10*3/uL (ref 0.0–0.1)
Immature Granulocytes: 0 %
Lymphocytes Absolute: 1.6 10*3/uL (ref 0.7–3.1)
Lymphs: 36 %
MCH: 32.6 pg (ref 26.6–33.0)
MCHC: 32.6 g/dL (ref 31.5–35.7)
MCV: 100 fL — ABNORMAL HIGH (ref 79–97)
Monocytes Absolute: 0.3 10*3/uL (ref 0.1–0.9)
Monocytes: 6 %
Neutrophils Absolute: 2.2 10*3/uL (ref 1.4–7.0)
Neutrophils: 49 %
Platelets: 285 10*3/uL (ref 150–450)
RBC: 4.84 x10E6/uL (ref 4.14–5.80)
RDW: 12.5 % (ref 11.6–15.4)
WBC: 4.5 10*3/uL (ref 3.4–10.8)

## 2023-06-19 LAB — HEMOGLOBIN A1C
Est. average glucose Bld gHb Est-mCnc: 120 mg/dL
Hgb A1c MFr Bld: 5.8 % — ABNORMAL HIGH (ref 4.8–5.6)

## 2023-06-19 LAB — PSA: Prostate Specific Ag, Serum: 0.4 ng/mL (ref 0.0–4.0)

## 2023-06-19 LAB — TSH: TSH: 1.87 u[IU]/mL (ref 0.450–4.500)

## 2023-06-29 ENCOUNTER — Ambulatory Visit (INDEPENDENT_AMBULATORY_CARE_PROVIDER_SITE_OTHER): Payer: MEDICAID | Admitting: Podiatry

## 2023-06-29 DIAGNOSIS — Z91199 Patient's noncompliance with other medical treatment and regimen due to unspecified reason: Secondary | ICD-10-CM

## 2023-06-29 NOTE — Progress Notes (Signed)
 1. No-show for appointment

## 2023-07-14 ENCOUNTER — Telehealth: Payer: Self-pay | Admitting: Nurse Practitioner

## 2023-07-14 DIAGNOSIS — F17200 Nicotine dependence, unspecified, uncomplicated: Secondary | ICD-10-CM

## 2023-07-14 NOTE — Telephone Encounter (Signed)
Copied from CRM [id number]. Topic: General - Other >> Jul 14, 2023  9:09 AM Epimenio Foot F wrote: Reason for CRM: Pt is calling in because he needs a referral for a lung screening. Please follow up with pt with any questions.

## 2023-07-14 NOTE — Telephone Encounter (Signed)
Routing to provider. Can this be ordered for the patient?

## 2023-07-14 NOTE — Telephone Encounter (Signed)
Referral placed for the CT lung.  Let him know that they will reach out to him and get him scheduled.

## 2023-08-15 ENCOUNTER — Other Ambulatory Visit: Payer: Self-pay | Admitting: Nurse Practitioner

## 2023-08-15 DIAGNOSIS — E1140 Type 2 diabetes mellitus with diabetic neuropathy, unspecified: Secondary | ICD-10-CM

## 2023-08-18 NOTE — Telephone Encounter (Signed)
 Requested Prescriptions  Pending Prescriptions Disp Refills   Accu-Chek Softclix Lancets lancets [Pharmacy Med Name: ACCU-CHEK SOFTCLIX LANCETS] 100 each 2    Sig: USE AS DIRECTED.     Endocrinology: Diabetes - Testing Supplies Passed - 08/18/2023  7:51 AM      Passed - Valid encounter within last 12 months    Recent Outpatient Visits           2 months ago Annual physical exam   [facility] Larae Grooms, NP   6 months ago S/P laparoscopic appendectomy   [facility] [facility] Rushie Goltz, Sherran Needs, NP   10 months ago Primary hypertension   [place] [facility] Larae Grooms, NP   1 year ago Primary hypertension   [facility] Larae Grooms, NP   1 year ago Annual physical exam   [facility] Larae Grooms, NP       Future Appointments             In 1 month Larae Grooms, NP  [facility]

## 2023-09-18 ENCOUNTER — Ambulatory Visit (INDEPENDENT_AMBULATORY_CARE_PROVIDER_SITE_OTHER): Payer: MEDICAID | Admitting: Nurse Practitioner

## 2023-09-18 ENCOUNTER — Encounter: Payer: Self-pay | Admitting: Nurse Practitioner

## 2023-09-18 VITALS — BP 125/85 | HR 79 | Ht 71.65 in | Wt 210.0 lb

## 2023-09-18 DIAGNOSIS — Z794 Long term (current) use of insulin: Secondary | ICD-10-CM

## 2023-09-18 DIAGNOSIS — E1140 Type 2 diabetes mellitus with diabetic neuropathy, unspecified: Secondary | ICD-10-CM | POA: Diagnosis not present

## 2023-09-18 DIAGNOSIS — Z0000 Encounter for general adult medical examination without abnormal findings: Secondary | ICD-10-CM | POA: Diagnosis not present

## 2023-09-18 MED ORDER — TIRZEPATIDE 2.5 MG/0.5ML ~~LOC~~ SOAJ: 2.5000 mg | SUBCUTANEOUS | 0 refills | Status: DC

## 2023-09-18 MED ORDER — TIRZEPATIDE 5 MG/0.5ML ~~LOC~~ SOAJ: 5.0000 mg | SUBCUTANEOUS | 1 refills | Status: DC

## 2023-09-18 NOTE — Progress Notes (Signed)
 BP 125/85 (BP Location: Left Arm, Patient Position: Sitting, Cuff Size: Large)   Pulse 79   Ht 5' 11.65" (1.82 m)   Wt 210 lb (95.3 kg)   SpO2 98%   BMI 28.76 kg/m    Subjective:    Patient ID: Lance Vaughan, male    DOB: [birth year], 58 y.o.   MRN: [id number]  HPI: Lance Vaughan is a 58 y.o. male  Chief Complaint  Patient presents with   Medication Management    Would like to switch from ozempic to [facility]   DIABETES Patient states he would like to switch from Ozempic to [place].  He would like to get back to 180-190.  He feels like he is at a stand still with weight loss with Ozempic.  He is having a hard time tolerating the medicaiton.  Having some nausea and constipation.  Hypoglycemic episodes:no Polydipsia/polyuria: no Visual disturbance: no Chest pain: no Paresthesias: yes Glucose Monitoring: yes  Accucheck frequency: Daily  Fasting glucose: 118  Post prandial:  Evening:  Before meals: Taking Insulin?: no  Long acting insulin:  Short acting insulin: Blood Pressure Monitoring: daily Retinal Examination: Not up to Date Foot Exam: Up to Date Diabetic Education: Not Completed Pneumovax: Up to Date Influenza: Not up to Date Aspirin: yes   Relevant past medical, surgical, family and social history reviewed and updated as indicated. Interim medical history since our last visit reviewed. Allergies and medications reviewed and updated.  Review of Systems  Eyes:  Negative for visual disturbance.  Respiratory:  Negative for chest tightness and shortness of breath.   Cardiovascular:  Negative for chest pain, palpitations and leg swelling.  Endocrine: Negative for polydipsia and polyuria.  Neurological:  Negative for dizziness, light-headedness, numbness and headaches.    Per HPI unless specifically indicated above     Objective:    BP 125/85 (BP Location: Left Arm, Patient Position: Sitting, Cuff Size: Large)   Pulse 79   Ht 5' 11.65" (1.82 m)   Wt 210 lb  (95.3 kg)   SpO2 98%   BMI 28.76 kg/m   Wt Readings from Last 3 Encounters:  09/18/23 210 lb (95.3 kg)  06/18/23 218 lb (98.9 kg)  02/02/23 222 lb (100.7 kg)    Physical Exam Vitals and nursing note reviewed.  Constitutional:      General: He is not in acute distress.    Appearance: Normal appearance. He is not ill-appearing, toxic-appearing or diaphoretic.  HENT:     Head: Normocephalic.     Right Ear: External ear normal.     Left Ear: External ear normal.     Nose: Nose normal. No congestion or rhinorrhea.     Mouth/Throat:     Mouth: Mucous membranes are moist.  Eyes:     General:        Right eye: No discharge.        Left eye: No discharge.     Extraocular Movements: Extraocular movements intact.     Conjunctiva/sclera: Conjunctivae normal.     Pupils: Pupils are equal, round, and reactive to light.  Cardiovascular:     Rate and Rhythm: Normal rate and regular rhythm.     Heart sounds: No murmur heard. Pulmonary:     Effort: Pulmonary effort is normal. No respiratory distress.     Breath sounds: Normal breath sounds. No wheezing, rhonchi or rales.  Abdominal:     General: Abdomen is flat. Bowel sounds are normal.  Musculoskeletal:  Cervical back: Normal range of motion and neck supple.  Skin:    General: Skin is warm and dry.     Capillary Refill: Capillary refill takes less than 2 seconds.  Neurological:     General: No focal deficit present.     Mental Status: He is alert and oriented to person, place, and time.  Psychiatric:        Mood and Affect: Mood normal.        Behavior: Behavior normal.        Thought Content: Thought content normal.        Judgment: Judgment normal.     Results for orders placed or performed in visit on 06/18/23  Microscopic Examination   Collection Time: 06/18/23  8:29 AM   Urine  Result Value Ref Range   WBC, UA 0-5 0 - 5 /hpf   RBC, Urine 0-2 0 - 2 /hpf   Epithelial Cells (non renal) 0-10 0 - 10 /hpf   Mucus, UA  Present (A) Not Estab.   Bacteria, UA None seen None seen/Few  Urinalysis, Routine w reflex microscopic   Collection Time: 06/18/23  8:29 AM  Result Value Ref Range   Specific Gravity, UA >1.030 (H) 1.005 - 1.030   pH, UA 5.5 5.0 - 7.5   Color, UA Yellow Yellow   Appearance Ur Clear Clear   Leukocytes,UA Negative Negative   Protein,UA Negative Negative/Trace   Glucose, UA Negative Negative   Ketones, UA Negative Negative   RBC, UA Trace (A) Negative   Bilirubin, UA Negative Negative   Urobilinogen, Ur 0.2 0.2 - 1.0 mg/dL   Nitrite, UA Negative Negative   Microscopic Examination See below:   Microalbumin, Urine Waived   Collection Time: 06/18/23  8:29 AM  Result Value Ref Range   Microalb, Ur Waived 80 (H) 0 - 19 mg/L   Creatinine, Urine Waived 300 10 - 300 mg/dL   Microalb/Creat Ratio 30-300 (H) <30 mg/g  TSH   Collection Time: 06/18/23  8:30 AM  Result Value Ref Range   TSH 1.870 0.450 - 4.500 uIU/mL  PSA   Collection Time: 06/18/23  8:30 AM  Result Value Ref Range   Prostate Specific Ag, Serum 0.4 0.0 - 4.0 ng/mL  Lipid panel   Collection Time: 06/18/23  8:30 AM  Result Value Ref Range   Cholesterol, Total 156 100 - 199 mg/dL   Triglycerides 161 0 - 149 mg/dL   HDL 52 >09 mg/dL   VLDL Cholesterol Cal 23 5 - 40 mg/dL   LDL Chol Calc (NIH) 81 0 - 99 mg/dL   Chol/HDL Ratio 3.0 0.0 - 5.0 ratio  CBC with Differential/Platelet   Collection Time: 06/18/23  8:30 AM  Result Value Ref Range   WBC 4.5 3.4 - 10.8 x10E3/uL   RBC 4.84 4.14 - 5.80 x10E6/uL   Hemoglobin 15.8 13.0 - 17.7 g/dL   Hematocrit 60.4 54.0 - 51.0 %   MCV 100 (H) 79 - 97 fL   MCH 32.6 26.6 - 33.0 pg   MCHC 32.6 31.5 - 35.7 g/dL   RDW 98.1 19.1 - 47.8 %   Platelets 285 150 - 450 x10E3/uL   Neutrophils 49 Not Estab. %   Lymphs 36 Not Estab. %   Monocytes 6 Not Estab. %   Eos 8 Not Estab. %   Basos 1 Not Estab. %   Neutrophils Absolute 2.2 1.4 - 7.0 x10E3/uL   Lymphocytes Absolute 1.6 0.7 - 3.1  x10E3/uL  Monocytes Absolute 0.3 0.1 - 0.9 x10E3/uL   EOS (ABSOLUTE) 0.4 0.0 - 0.4 x10E3/uL   Basophils Absolute 0.0 0.0 - 0.2 x10E3/uL   Immature Granulocytes 0 Not Estab. %   Immature Grans (Abs) 0.0 0.0 - 0.1 x10E3/uL  Comprehensive metabolic panel   Collection Time: 06/18/23  8:30 AM  Result Value Ref Range   Glucose 105 (H) 70 - 99 mg/dL   BUN 19 6 - 24 mg/dL   Creatinine, Ser 1.61 0.76 - 1.27 mg/dL   eGFR 67 >09 UE/AVW/0.98   BUN/Creatinine Ratio 15 9 - 20   Sodium 141 134 - 144 mmol/L   Potassium 4.1 3.5 - 5.2 mmol/L   Chloride 106 96 - 106 mmol/L   CO2 21 20 - 29 mmol/L   Calcium 9.2 8.7 - 10.2 mg/dL   Total Protein 6.8 6.0 - 8.5 g/dL   Albumin 4.3 3.8 - 4.9 g/dL   Globulin, Total 2.5 1.5 - 4.5 g/dL   Bilirubin Total 0.3 0.0 - 1.2 mg/dL   Alkaline Phosphatase 69 44 - 121 IU/L   AST 41 (H) 0 - 40 IU/L   ALT 39 0 - 44 IU/L  HgB A1c   Collection Time: 06/18/23  8:30 AM  Result Value Ref Range   Hgb A1c MFr Bld 5.8 (H) 4.8 - 5.6 %   Est. average glucose Bld gHb Est-mCnc 120 mg/dL      Assessment & Plan:   Problem List Items Addressed This Visit       Endocrine   Type 2 diabetes mellitus with diabetic neuropathy, unspecified (HCC)   Chronic.  Improved with Ozempic but patient is having side effects from medication- nausea and constipation.  Will change to mounjaro.  Will start at 2.5mg  weekly then increase to 5mg  weekly.  Follow up in 3 months.  Call sooner if concerns arise.       Relevant Medications   tirzepatide (MOUNJARO) 2.5 MG/0.5ML Pen   tirzepatide (MOUNJARO) 5 MG/0.5ML Pen   Other Visit Diagnoses       Annual physical exam    -  Primary        Follow up plan: Return in about 3 months (around 12/19/2023) for HTN, HLD, DM2 FU.

## 2023-09-18 NOTE — Assessment & Plan Note (Signed)
 Chronic.  Improved with Ozempic but patient is having side effects from medication- nausea and constipation.  Will change to mounjaro.  Will start at 2.5mg  weekly then increase to 5mg  weekly.  Follow up in 3 months.  Call sooner if concerns arise.

## 2023-09-21 ENCOUNTER — Telehealth: Payer: Self-pay

## 2023-09-21 NOTE — Telephone Encounter (Signed)
 PA for [facility] initiated and submitted via Cover My Meds. Key: Nena Polio

## 2023-09-22 NOTE — Telephone Encounter (Signed)
 PA approved. Called and notified patient of approval.

## 2023-10-13 ENCOUNTER — Other Ambulatory Visit: Payer: Self-pay | Admitting: Nurse Practitioner

## 2023-10-14 NOTE — Telephone Encounter (Signed)
 Requested medication (s) are due for refill today - not at this dose  Requested medication (s) are on the active medication list -yes  Future visit scheduled -yes  Last refill: 09/18/23  2.5mg /31ml, 5mg /53ml  Notes to clinic: off protocol- should go up to next dose, provider review   Requested Prescriptions  Pending Prescriptions Disp Refills   MOUNJARO 2.5 MG/0.5ML Pen [Pharmacy Med Name: MOUNJARO 2.5 MG/0.5ML SUBQ SOLN ML] 2 mL 0    Sig: INJECT 2.5MG  SUBCUTANEOUSLY ONCE A WEEK     Off-Protocol Failed - 10/14/2023  8:34 AM      Failed - Medication not assigned to a protocol, review manually.      Passed - Valid encounter within last 12 months    Recent Outpatient Visits           3 weeks ago Annual physical exam   [facility] Aileen Alexanders, NP                 Requested Prescriptions  Pending Prescriptions Disp Refills   MOUNJARO 2.5 MG/0.5ML Pen [Pharmacy Med Name: MOUNJARO 2.5 MG/0.5ML SUBQ SOLN ML] 2 mL 0    Sig: INJECT 2.5MG  SUBCUTANEOUSLY ONCE A WEEK     Off-Protocol Failed - 10/14/2023  8:34 AM      Failed - Medication not assigned to a protocol, review manually.      Passed - Valid encounter within last 12 months    Recent Outpatient Visits           3 weeks ago Annual physical exam   [facility] Aileen Alexanders, NP

## 2023-11-25 ENCOUNTER — Other Ambulatory Visit: Payer: Self-pay | Admitting: Nurse Practitioner

## 2023-11-25 ENCOUNTER — Telehealth: Payer: Self-pay

## 2023-11-25 NOTE — Telephone Encounter (Signed)
 Copied from CRM [id number]. Topic: Clinical - Medication Refill >> Nov 25, 2023 11:50 AM Lance Vaughan wrote: Medication: dapagliflozin  propanediol (FARXIGA ) 10 MG TABS tabl  Has the patient contacted their pharmacy? Yes (Agent: If no, request that the patient contact the pharmacy for the refill. If patient does not wish to contact the pharmacy document the reason why and proceed with request.) (Agent: If yes, when and what did the pharmacy advise?) Needs PA  This is the patient'Vaughan preferred pharmacy:  [facility] [place], [place] - [address] [place] [address] Phone: [phone] Fax: [phone]  Is this the correct pharmacy for this prescription? Yes If no, delete pharmacy and type the correct one.   Has the prescription been filled recently? No  Is the patient out of the medication? Yes  Has the patient been seen for an appointment in the last year OR does the patient have an upcoming appointment? Yes  Can we respond through MyChart? No  Agent: Please be advised that Rx refills may take up to 3 business days. We ask that you follow-up with your pharmacy.

## 2023-11-25 NOTE — Telephone Encounter (Signed)
 Routing to  PA team

## 2023-11-25 NOTE — Telephone Encounter (Signed)
 Copied from CRM [id number]. Topic: Clinical - Medication Prior Auth >> Nov 25, 2023 11:39 AM Georgeann Kindred wrote: Reason for CRM: Patient requesting an update on PA for dapagliflozin  propanediol (FARXIGA ) 10 MG TABS tablet. States that he has been out of his medication for 2 months and waiting on the PA. States that he has spoken to the pharmacy and they state that patient still needs a PA to obtain the medication. Please contact patient at [phone].

## 2023-11-26 ENCOUNTER — Telehealth: Payer: Self-pay

## 2023-11-26 ENCOUNTER — Other Ambulatory Visit (HOSPITAL_COMMUNITY): Payer: Self-pay

## 2023-11-26 MED ORDER — DAPAGLIFLOZIN PROPANEDIOL 10 MG PO TABS: 10.0000 mg | ORAL_TABLET | Freq: Every day | ORAL | 1 refills | Status: DC

## 2023-11-26 NOTE — Telephone Encounter (Signed)
 Requested Prescriptions  Pending Prescriptions Disp Refills   dapagliflozin  propanediol (FARXIGA ) 10 MG TABS tablet 90 tablet 1    Sig: Take 1 tablet (10 mg total) by mouth daily. needs appointment for further refills     Endocrinology:  Diabetes - SGLT2 Inhibitors Passed - 11/26/2023  1:15 PM      Passed - Cr in normal range and within 360 days    Creatinine, Ser  Date Value Ref Range Status  06/18/2023 1.25 0.76 - 1.27 mg/dL Final         Passed - HBA1C is between 0 and 7.9 and within 180 days    HB A1C (BAYER DCA - WAIVED)  Date Value Ref Range Status  10/08/2021 5.2 4.8 - 5.6 % Final    Comment:             Prediabetes: 5.7 - 6.4          Diabetes: >6.4          Glycemic control for adults with diabetes: <7.0    Hgb A1c MFr Bld  Date Value Ref Range Status  06/18/2023 5.8 (H) 4.8 - 5.6 % Final    Comment:             Prediabetes: 5.7 - 6.4          Diabetes: >6.4          Glycemic control for adults with diabetes: <7.0          Passed - eGFR in normal range and within 360 days    GFR calc Af Amer  Date Value Ref Range Status  02/14/2020 78 >59 mL/min/1.73 Final    Comment:    **[facility] currently reports eGFR in compliance with the current**   recommendations of the [employer]. [facility] will   update reporting as new guidelines are published from the NKF-ASN   Task force.    GFR, Estimated  Date Value Ref Range Status  01/27/2023 56 (L) >60 mL/min Final    Comment:    (NOTE) Calculated using the CKD-EPI Creatinine Equation (2021)    eGFR  Date Value Ref Range Status  06/18/2023 67 >59 mL/min/1.73 Final         Passed - Valid encounter within last 6 months    Recent Outpatient Visits           2 months ago Annual physical exam   [facility] Aileen Alexanders, NP

## 2023-11-26 NOTE — Telephone Encounter (Signed)
 PA request has been Submitted. New Encounter has been or will be created for follow up. For additional info see Pharmacy Prior Auth telephone encounter from 11/26/2023.

## 2023-11-26 NOTE — Telephone Encounter (Signed)
 Pharmacy Patient Advocate Encounter   Received notification from Pt Calls Messages that prior authorization for Dapagliflozin  Propanediol 10MG  tablets is required/requested.   Insurance verification completed.   The patient is insured through [employer] .   Per test claim: PA required; PA submitted to above mentioned insurance via CoverMyMeds Key/confirmation #/EOC [id number] Status is pending

## 2023-11-27 ENCOUNTER — Other Ambulatory Visit (HOSPITAL_COMMUNITY): Payer: Self-pay

## 2023-11-27 NOTE — Telephone Encounter (Signed)
 Pharmacy Patient Advocate Encounter  Received notification from [facility] that Prior Authorization for Dapagliflozin  Propanediol 10MG  tablets  has been APPROVED from 11/26/23 to 11/25/24. Ran test claim, Copay is $4. This test claim was processed through [facility]- copay amounts may vary at other pharmacies due to pharmacy/plan contracts, or as the patient moves through the different stages of their insurance plan.   PA #/Case ID/Reference #: [id number]  *Spoke with [facility] to process

## 2023-12-01 ENCOUNTER — Other Ambulatory Visit (HOSPITAL_COMMUNITY): Payer: Self-pay

## 2023-12-22 ENCOUNTER — Ambulatory Visit: Payer: MEDICAID | Admitting: Nurse Practitioner

## 2023-12-22 NOTE — Progress Notes (Deleted)
 There were no vitals taken for this visit.   Subjective:    Patient ID: Lance Vaughan, male    DOB: 05/29/1966, 58 y.o.   MRN: [id number]  HPI: Lance Vaughan is a 58 y.o. male  No chief complaint on file.  HYPERTENSION / HYPERLIPIDEMIA Satisfied with current treatment? no Duration of hypertension: years BP monitoring frequency: not checking BP range:  BP medication side effects: no Past BP meds: amlodipine  Duration of hyperlipidemia: years Cholesterol medication side effects: no Cholesterol supplements: none Past cholesterol medications: atorvastain (lipitor) Medication compliance: excellent compliance Aspirin : yes Recent stressors: no Recurrent headaches: no Visual changes: no Palpitations: no Dyspnea: yes Chest pain: no Lower extremity edema: no Dizzy/lightheaded: no  DIABETES Hypoglycemic episodes:no Polydipsia/polyuria: no Visual disturbance: no Chest pain: no Paresthesias: yes Glucose Monitoring: yes  Accucheck frequency: Daily  Fasting glucose: 118  Post prandial:  Evening:  Before meals: Taking Insulin ?: no  Long acting insulin :  Short acting insulin : Blood Pressure Monitoring: daily Retinal Examination: Not up to Date Foot Exam: Up to Date Diabetic Education: Not Completed Pneumovax: Up to Date Influenza: Not up to Date Aspirin : yes  Patient states his hips are hurting on both sides.  It hurts all the way down through his legs to his feet.  He is also having back pain.  He is taking 8 tylenol  per day.  Sometimes he is taking more than that because his pain isn't well controlled.  He is having trouble sleeping because of the pain.  States the gabapentin  didn't help his pain at all.  He hasn't seen anyone else for his pain.  Has trouble with transportation to see a specialist.   Relevant past medical, surgical, family and social history reviewed and updated as indicated. Interim medical history since our last visit reviewed. Allergies and medications  reviewed and updated.  Review of Systems  Per HPI unless specifically indicated above     Objective:    There were no vitals taken for this visit.  Wt Readings from Last 3 Encounters:  09/18/23 210 lb (95.3 kg)  06/18/23 218 lb (98.9 kg)  02/02/23 222 lb (100.7 kg)    Physical Exam  Results for orders placed or performed in visit on 06/18/23  Microscopic Examination   Collection Time: 06/18/23  8:29 AM   Urine  Result Value Ref Range   WBC, UA 0-5 0 - 5 /hpf   RBC, Urine 0-2 0 - 2 /hpf   Epithelial Cells (non renal) 0-10 0 - 10 /hpf   Mucus, UA Present (A) Not Estab.   Bacteria, UA None seen None seen/Few  Urinalysis, Routine w reflex microscopic   Collection Time: 06/18/23  8:29 AM  Result Value Ref Range   Specific Gravity, UA >1.030 (H) 1.005 - 1.030   pH, UA 5.5 5.0 - 7.5   Color, UA Yellow Yellow   Appearance Ur Clear Clear   Leukocytes,UA Negative Negative   Protein,UA Negative Negative/Trace   Glucose, UA Negative Negative   Ketones, UA Negative Negative   RBC, UA Trace (A) Negative   Bilirubin, UA Negative Negative   Urobilinogen, Ur 0.2 0.2 - 1.0 mg/dL   Nitrite, UA Negative Negative   Microscopic Examination See below:   Microalbumin, Urine Waived   Collection Time: 06/18/23  8:29 AM  Result Value Ref Range   Microalb, Ur Waived 80 (H) 0 - 19 mg/L   Creatinine, Urine Waived 300 10 - 300 mg/dL   Microalb/Creat Ratio 30-300 (H) <30 mg/g  TSH   Collection Time: 06/18/23  8:30 AM  Result Value Ref Range   TSH 1.870 0.450 - 4.500 uIU/mL  PSA   Collection Time: 06/18/23  8:30 AM  Result Value Ref Range   Prostate Specific Ag, Serum 0.4 0.0 - 4.0 ng/mL  Lipid panel   Collection Time: 06/18/23  8:30 AM  Result Value Ref Range   Cholesterol, Total 156 100 - 199 mg/dL   Triglycerides 868 0 - 149 mg/dL   HDL 52 >60 mg/dL   VLDL Cholesterol Cal 23 5 - 40 mg/dL   LDL Chol Calc (NIH) 81 0 - 99 mg/dL   Chol/HDL Ratio 3.0 0.0 - 5.0 ratio  CBC with  Differential/Platelet   Collection Time: 06/18/23  8:30 AM  Result Value Ref Range   WBC 4.5 3.4 - 10.8 x10E3/uL   RBC 4.84 4.14 - 5.80 x10E6/uL   Hemoglobin 15.8 13.0 - 17.7 g/dL   Hematocrit 51.4 62.4 - 51.0 %   MCV 100 (H) 79 - 97 fL   MCH 32.6 26.6 - 33.0 pg   MCHC 32.6 31.5 - 35.7 g/dL   RDW 87.4 88.3 - 84.5 %   Platelets 285 150 - 450 x10E3/uL   Neutrophils 49 Not Estab. %   Lymphs 36 Not Estab. %   Monocytes 6 Not Estab. %   Eos 8 Not Estab. %   Basos 1 Not Estab. %   Neutrophils Absolute 2.2 1.4 - 7.0 x10E3/uL   Lymphocytes Absolute 1.6 0.7 - 3.1 x10E3/uL   Monocytes Absolute 0.3 0.1 - 0.9 x10E3/uL   EOS (ABSOLUTE) 0.4 0.0 - 0.4 x10E3/uL   Basophils Absolute 0.0 0.0 - 0.2 x10E3/uL   Immature Granulocytes 0 Not Estab. %   Immature Grans (Abs) 0.0 0.0 - 0.1 x10E3/uL  Comprehensive metabolic panel   Collection Time: 06/18/23  8:30 AM  Result Value Ref Range   Glucose 105 (H) 70 - 99 mg/dL   BUN 19 6 - 24 mg/dL   Creatinine, Ser 8.74 0.76 - 1.27 mg/dL   eGFR 67 >40 fO/fpw/8.26   BUN/Creatinine Ratio 15 9 - 20   Sodium 141 134 - 144 mmol/L   Potassium 4.1 3.5 - 5.2 mmol/L   Chloride 106 96 - 106 mmol/L   CO2 21 20 - 29 mmol/L   Calcium  9.2 8.7 - 10.2 mg/dL   Total Protein 6.8 6.0 - 8.5 g/dL   Albumin 4.3 3.8 - 4.9 g/dL   Globulin, Total 2.5 1.5 - 4.5 g/dL   Bilirubin Total 0.3 0.0 - 1.2 mg/dL   Alkaline Phosphatase 69 44 - 121 IU/L   AST 41 (H) 0 - 40 IU/L   ALT 39 0 - 44 IU/L  HgB A1c   Collection Time: 06/18/23  8:30 AM  Result Value Ref Range   Hgb A1c MFr Bld 5.8 (H) 4.8 - 5.6 %   Est. average glucose Bld gHb Est-mCnc 120 mg/dL      Assessment & Plan:   Problem List Items Addressed This Visit       Cardiovascular and Mediastinum   Hypertension     Endocrine   Type 2 diabetes mellitus with diabetic neuropathy, unspecified (HCC) - Primary     Other   Hyperlipidemia   Major depression, recurrent (HCC)   Bipolar disorder (HCC)   Morbid obesity  (HCC)     Follow up plan: No follow-ups on file.

## 2023-12-23 ENCOUNTER — Ambulatory Visit: Payer: MEDICAID | Admitting: Nurse Practitioner

## 2023-12-23 NOTE — Progress Notes (Deleted)
 There were no vitals taken for this visit.   Subjective:    Patient ID: Lance Vaughan, male    DOB: [birth year], 58 y.o.   MRN: [id number]  HPI: Lance Vaughan is a 58 y.o. male  No chief complaint on file.  HYPERTENSION / HYPERLIPIDEMIA Satisfied with current treatment? no Duration of hypertension: years BP monitoring frequency: not checking BP range:  BP medication side effects: no Past BP meds: amlodipine  Duration of hyperlipidemia: years Cholesterol medication side effects: no Cholesterol supplements: none Past cholesterol medications: atorvastain (lipitor) Medication compliance: excellent compliance Aspirin : yes Recent stressors: no Recurrent headaches: no Visual changes: no Palpitations: no Dyspnea: yes Chest pain: no Lower extremity edema: no Dizzy/lightheaded: no  DIABETES Hypoglycemic episodes:no Polydipsia/polyuria: no Visual disturbance: no Chest pain: no Paresthesias: yes Glucose Monitoring: yes  Accucheck frequency: Daily  Fasting glucose: 118  Post prandial:  Evening:  Before meals: Taking Insulin ?: no  Long acting insulin :  Short acting insulin : Blood Pressure Monitoring: daily Retinal Examination: Not up to Date Foot Exam: Up to Date Diabetic Education: Not Completed Pneumovax: Up to Date Influenza: Not up to Date Aspirin : yes  Patient states his hips are hurting on both sides.  It hurts all the way down through his legs to his feet.  He is also having back pain.  He is taking 8 tylenol  per day.  Sometimes he is taking more than that because his pain isn't well controlled.  He is having trouble sleeping because of the pain.  States the gabapentin  didn't help his pain at all.  He hasn't seen anyone else for his pain.  Has trouble with transportation to see a specialist.   Relevant past medical, surgical, family and social history reviewed and updated as indicated. Interim medical history since our last visit reviewed. Allergies and medications  reviewed and updated.  Review of Systems  Per HPI unless specifically indicated above     Objective:    There were no vitals taken for this visit.  Wt Readings from Last 3 Encounters:  09/18/23 210 lb (95.3 kg)  06/18/23 218 lb (98.9 kg)  02/02/23 222 lb (100.7 kg)    Physical Exam  Results for orders placed or performed in visit on 06/18/23  Microscopic Examination   Collection Time: 06/18/23  8:29 AM   Urine  Result Value Ref Range   WBC, UA 0-5 0 - 5 /hpf   RBC, Urine 0-2 0 - 2 /hpf   Epithelial Cells (non renal) 0-10 0 - 10 /hpf   Mucus, UA Present (A) Not Estab.   Bacteria, UA None seen None seen/Few  Urinalysis, Routine w reflex microscopic   Collection Time: 06/18/23  8:29 AM  Result Value Ref Range   Specific Gravity, UA >1.030 (H) 1.005 - 1.030   pH, UA 5.5 5.0 - 7.5   Color, UA Yellow Yellow   Appearance Ur Clear Clear   Leukocytes,UA Negative Negative   Protein,UA Negative Negative/Trace   Glucose, UA Negative Negative   Ketones, UA Negative Negative   RBC, UA Trace (A) Negative   Bilirubin, UA Negative Negative   Urobilinogen, Ur 0.2 0.2 - 1.0 mg/dL   Nitrite, UA Negative Negative   Microscopic Examination See below:   Microalbumin, Urine Waived   Collection Time: 06/18/23  8:29 AM  Result Value Ref Range   Microalb, Ur Waived 80 (H) 0 - 19 mg/L   Creatinine, Urine Waived 300 10 - 300 mg/dL   Microalb/Creat Ratio 30-300 (H) <30 mg/g  TSH   Collection Time: 06/18/23  8:30 AM  Result Value Ref Range   TSH 1.870 0.450 - 4.500 uIU/mL  PSA   Collection Time: 06/18/23  8:30 AM  Result Value Ref Range   Prostate Specific Ag, Serum 0.4 0.0 - 4.0 ng/mL  Lipid panel   Collection Time: 06/18/23  8:30 AM  Result Value Ref Range   Cholesterol, Total 156 100 - 199 mg/dL   Triglycerides 868 0 - 149 mg/dL   HDL 52 >60 mg/dL   VLDL Cholesterol Cal 23 5 - 40 mg/dL   LDL Chol Calc (NIH) 81 0 - 99 mg/dL   Chol/HDL Ratio 3.0 0.0 - 5.0 ratio  CBC with  Differential/Platelet   Collection Time: 06/18/23  8:30 AM  Result Value Ref Range   WBC 4.5 3.4 - 10.8 x10E3/uL   RBC 4.84 4.14 - 5.80 x10E6/uL   Hemoglobin 15.8 13.0 - 17.7 g/dL   Hematocrit 51.4 62.4 - 51.0 %   MCV 100 (H) 79 - 97 fL   MCH 32.6 26.6 - 33.0 pg   MCHC 32.6 31.5 - 35.7 g/dL   RDW 87.4 88.3 - 84.5 %   Platelets 285 150 - 450 x10E3/uL   Neutrophils 49 Not Estab. %   Lymphs 36 Not Estab. %   Monocytes 6 Not Estab. %   Eos 8 Not Estab. %   Basos 1 Not Estab. %   Neutrophils Absolute 2.2 1.4 - 7.0 x10E3/uL   Lymphocytes Absolute 1.6 0.7 - 3.1 x10E3/uL   Monocytes Absolute 0.3 0.1 - 0.9 x10E3/uL   EOS (ABSOLUTE) 0.4 0.0 - 0.4 x10E3/uL   Basophils Absolute 0.0 0.0 - 0.2 x10E3/uL   Immature Granulocytes 0 Not Estab. %   Immature Grans (Abs) 0.0 0.0 - 0.1 x10E3/uL  Comprehensive metabolic panel   Collection Time: 06/18/23  8:30 AM  Result Value Ref Range   Glucose 105 (H) 70 - 99 mg/dL   BUN 19 6 - 24 mg/dL   Creatinine, Ser 8.74 0.76 - 1.27 mg/dL   eGFR 67 >40 fO/fpw/8.26   BUN/Creatinine Ratio 15 9 - 20   Sodium 141 134 - 144 mmol/L   Potassium 4.1 3.5 - 5.2 mmol/L   Chloride 106 96 - 106 mmol/L   CO2 21 20 - 29 mmol/L   Calcium  9.2 8.7 - 10.2 mg/dL   Total Protein 6.8 6.0 - 8.5 g/dL   Albumin 4.3 3.8 - 4.9 g/dL   Globulin, Total 2.5 1.5 - 4.5 g/dL   Bilirubin Total 0.3 0.0 - 1.2 mg/dL   Alkaline Phosphatase 69 44 - 121 IU/L   AST 41 (H) 0 - 40 IU/L   ALT 39 0 - 44 IU/L  HgB A1c   Collection Time: 06/18/23  8:30 AM  Result Value Ref Range   Hgb A1c MFr Bld 5.8 (H) 4.8 - 5.6 %   Est. average glucose Bld gHb Est-mCnc 120 mg/dL      Assessment & Plan:   Problem List Items Addressed This Visit   None     Follow up plan: No follow-ups on file.

## 2024-01-13 ENCOUNTER — Ambulatory Visit: Payer: MEDICAID | Admitting: Nurse Practitioner

## 2024-01-13 NOTE — Progress Notes (Deleted)
 There were no vitals taken for this visit.   Subjective:    Patient ID: Lance Vaughan, male    DOB: 05/29/1966, 58 y.o.   MRN: [id number]  HPI: Lance Vaughan is a 58 y.o. male  No chief complaint on file.  HYPERTENSION / HYPERLIPIDEMIA Satisfied with current treatment? no Duration of hypertension: years BP monitoring frequency: not checking BP range:  BP medication side effects: no Past BP meds: amlodipine  Duration of hyperlipidemia: years Cholesterol medication side effects: no Cholesterol supplements: none Past cholesterol medications: atorvastain (lipitor) Medication compliance: excellent compliance Aspirin : yes Recent stressors: no Recurrent headaches: no Visual changes: no Palpitations: no Dyspnea: yes Chest pain: no Lower extremity edema: no Dizzy/lightheaded: no  DIABETES Hypoglycemic episodes:no Polydipsia/polyuria: no Visual disturbance: no Chest pain: no Paresthesias: yes Glucose Monitoring: yes  Accucheck frequency: Daily  Fasting glucose: 118  Post prandial:  Evening:  Before meals: Taking Insulin ?: no  Long acting insulin :  Short acting insulin : Blood Pressure Monitoring: daily Retinal Examination: Not up to Date Foot Exam: Up to Date Diabetic Education: Not Completed Pneumovax: Up to Date Influenza: Not up to Date Aspirin : yes  Patient states his hips are hurting on both sides.  It hurts all the way down through his legs to his feet.  He is also having back pain.  He is taking 8 tylenol  per day.  Sometimes he is taking more than that because his pain isn't well controlled.  He is having trouble sleeping because of the pain.  States the gabapentin  didn't help his pain at all.  He hasn't seen anyone else for his pain.  Has trouble with transportation to see a specialist.   Relevant past medical, surgical, family and social history reviewed and updated as indicated. Interim medical history since our last visit reviewed. Allergies and medications  reviewed and updated.  Review of Systems  Per HPI unless specifically indicated above     Objective:    There were no vitals taken for this visit.  Wt Readings from Last 3 Encounters:  09/18/23 210 lb (95.3 kg)  06/18/23 218 lb (98.9 kg)  02/02/23 222 lb (100.7 kg)    Physical Exam  Results for orders placed or performed in visit on 06/18/23  Microscopic Examination   Collection Time: 06/18/23  8:29 AM   Urine  Result Value Ref Range   WBC, UA 0-5 0 - 5 /hpf   RBC, Urine 0-2 0 - 2 /hpf   Epithelial Cells (non renal) 0-10 0 - 10 /hpf   Mucus, UA Present (A) Not Estab.   Bacteria, UA None seen None seen/Few  Urinalysis, Routine w reflex microscopic   Collection Time: 06/18/23  8:29 AM  Result Value Ref Range   Specific Gravity, UA >1.030 (H) 1.005 - 1.030   pH, UA 5.5 5.0 - 7.5   Color, UA Yellow Yellow   Appearance Ur Clear Clear   Leukocytes,UA Negative Negative   Protein,UA Negative Negative/Trace   Glucose, UA Negative Negative   Ketones, UA Negative Negative   RBC, UA Trace (A) Negative   Bilirubin, UA Negative Negative   Urobilinogen, Ur 0.2 0.2 - 1.0 mg/dL   Nitrite, UA Negative Negative   Microscopic Examination See below:   Microalbumin, Urine Waived   Collection Time: 06/18/23  8:29 AM  Result Value Ref Range   Microalb, Ur Waived 80 (H) 0 - 19 mg/L   Creatinine, Urine Waived 300 10 - 300 mg/dL   Microalb/Creat Ratio 30-300 (H) <30 mg/g  TSH   Collection Time: 06/18/23  8:30 AM  Result Value Ref Range   TSH 1.870 0.450 - 4.500 uIU/mL  PSA   Collection Time: 06/18/23  8:30 AM  Result Value Ref Range   Prostate Specific Ag, Serum 0.4 0.0 - 4.0 ng/mL  Lipid panel   Collection Time: 06/18/23  8:30 AM  Result Value Ref Range   Cholesterol, Total 156 100 - 199 mg/dL   Triglycerides 868 0 - 149 mg/dL   HDL 52 >60 mg/dL   VLDL Cholesterol Cal 23 5 - 40 mg/dL   LDL Chol Calc (NIH) 81 0 - 99 mg/dL   Chol/HDL Ratio 3.0 0.0 - 5.0 ratio  CBC with  Differential/Platelet   Collection Time: 06/18/23  8:30 AM  Result Value Ref Range   WBC 4.5 3.4 - 10.8 x10E3/uL   RBC 4.84 4.14 - 5.80 x10E6/uL   Hemoglobin 15.8 13.0 - 17.7 g/dL   Hematocrit 51.4 62.4 - 51.0 %   MCV 100 (H) 79 - 97 fL   MCH 32.6 26.6 - 33.0 pg   MCHC 32.6 31.5 - 35.7 g/dL   RDW 87.4 88.3 - 84.5 %   Platelets 285 150 - 450 x10E3/uL   Neutrophils 49 Not Estab. %   Lymphs 36 Not Estab. %   Monocytes 6 Not Estab. %   Eos 8 Not Estab. %   Basos 1 Not Estab. %   Neutrophils Absolute 2.2 1.4 - 7.0 x10E3/uL   Lymphocytes Absolute 1.6 0.7 - 3.1 x10E3/uL   Monocytes Absolute 0.3 0.1 - 0.9 x10E3/uL   EOS (ABSOLUTE) 0.4 0.0 - 0.4 x10E3/uL   Basophils Absolute 0.0 0.0 - 0.2 x10E3/uL   Immature Granulocytes 0 Not Estab. %   Immature Grans (Abs) 0.0 0.0 - 0.1 x10E3/uL  Comprehensive metabolic panel   Collection Time: 06/18/23  8:30 AM  Result Value Ref Range   Glucose 105 (H) 70 - 99 mg/dL   BUN 19 6 - 24 mg/dL   Creatinine, Ser 8.74 0.76 - 1.27 mg/dL   eGFR 67 >40 fO/fpw/8.26   BUN/Creatinine Ratio 15 9 - 20   Sodium 141 134 - 144 mmol/L   Potassium 4.1 3.5 - 5.2 mmol/L   Chloride 106 96 - 106 mmol/L   CO2 21 20 - 29 mmol/L   Calcium  9.2 8.7 - 10.2 mg/dL   Total Protein 6.8 6.0 - 8.5 g/dL   Albumin 4.3 3.8 - 4.9 g/dL   Globulin, Total 2.5 1.5 - 4.5 g/dL   Bilirubin Total 0.3 0.0 - 1.2 mg/dL   Alkaline Phosphatase 69 44 - 121 IU/L   AST 41 (H) 0 - 40 IU/L   ALT 39 0 - 44 IU/L  HgB A1c   Collection Time: 06/18/23  8:30 AM  Result Value Ref Range   Hgb A1c MFr Bld 5.8 (H) 4.8 - 5.6 %   Est. average glucose Bld gHb Est-mCnc 120 mg/dL      Assessment & Plan:   Problem List Items Addressed This Visit       Cardiovascular and Mediastinum   Hypertension     Endocrine   Type 2 diabetes mellitus with diabetic neuropathy, unspecified (HCC) - Primary     Other   Hyperlipidemia   Major depression, recurrent (HCC)   Bipolar disorder (HCC)   Morbid obesity  (HCC)     Follow up plan: No follow-ups on file.

## 2024-01-19 ENCOUNTER — Telehealth: Payer: Self-pay | Admitting: Nurse Practitioner

## 2024-01-19 ENCOUNTER — Encounter: Payer: Self-pay | Admitting: Nurse Practitioner

## 2024-01-19 ENCOUNTER — Ambulatory Visit: Payer: MEDICAID | Admitting: Nurse Practitioner

## 2024-01-19 VITALS — BP 120/84 | HR 71 | Temp 98.5°F | Ht 71.6 in | Wt 214.0 lb

## 2024-01-19 DIAGNOSIS — I10 Essential (primary) hypertension: Secondary | ICD-10-CM

## 2024-01-19 DIAGNOSIS — E1140 Type 2 diabetes mellitus with diabetic neuropathy, unspecified: Secondary | ICD-10-CM

## 2024-01-19 DIAGNOSIS — Z794 Long term (current) use of insulin: Secondary | ICD-10-CM | POA: Diagnosis not present

## 2024-01-19 DIAGNOSIS — Z1159 Encounter for screening for other viral diseases: Secondary | ICD-10-CM

## 2024-01-19 DIAGNOSIS — F3132 Bipolar disorder, current episode depressed, moderate: Secondary | ICD-10-CM

## 2024-01-19 DIAGNOSIS — F331 Major depressive disorder, recurrent, moderate: Secondary | ICD-10-CM

## 2024-01-19 DIAGNOSIS — E782 Mixed hyperlipidemia: Secondary | ICD-10-CM

## 2024-01-19 MED ORDER — DAPAGLIFLOZIN PROPANEDIOL 10 MG PO TABS: 10.0000 mg | ORAL_TABLET | Freq: Every day | ORAL | 1 refills | Status: DC

## 2024-01-19 MED ORDER — VALSARTAN 80 MG PO TABS: 80.0000 mg | ORAL_TABLET | Freq: Every day | ORAL | 1 refills | Status: DC

## 2024-01-19 MED ORDER — ACCU-CHEK AVIVA PLUS VI STRP: ORAL_STRIP | 2 refills | Status: DC

## 2024-01-19 MED ORDER — METOPROLOL SUCCINATE ER 25 MG PO TB24: 25.0000 mg | ORAL_TABLET | Freq: Every day | ORAL | 1 refills | Status: DC

## 2024-01-19 MED ORDER — TIRZEPATIDE 5 MG/0.5ML ~~LOC~~ SOAJ: 5.0000 mg | SUBCUTANEOUS | 1 refills | Status: DC

## 2024-01-19 NOTE — Progress Notes (Signed)
 BP 120/84   Pulse 71   Temp 98.5 F (36.9 C) (Oral)   Ht 5' 11.6 (1.819 m)   Wt 214 lb (97.1 kg)   SpO2 96%   BMI 29.35 kg/m    Subjective:    Patient ID: Lance Vaughan, male    DOB: [birth year], 58 y.o.   MRN: [id number]  HPI: Lance Vaughan is a 58 y.o. male  Chief Complaint  Patient presents with   Hypertension   HYPERTENSION / HYPERLIPIDEMIA Satisfied with current treatment? no Duration of hypertension: years BP monitoring frequency: not checking BP range:  BP medication side effects: no Past BP meds: amlodipine  Duration of hyperlipidemia: years Cholesterol medication side effects: no Cholesterol supplements: none Past cholesterol medications: atorvastain (lipitor) Medication compliance: excellent compliance Aspirin : yes Recent stressors: no Recurrent headaches: no Visual changes: no Palpitations: no Dyspnea: yes Chest pain: no Lower extremity edema: no Dizzy/lightheaded: no  DIABETES Hypoglycemic episodes:no Polydipsia/polyuria: no Visual disturbance: no Chest pain: no Paresthesias: yes Glucose Monitoring: yes  Accucheck frequency: Daily  Fasting glucose: 117-125  Post prandial:  Evening:  Before meals: Taking Insulin ?: no  Long acting insulin :  Short acting insulin : Blood Pressure Monitoring: daily Retinal Examination: Not up to Date Foot Exam: Up to Date Diabetic Education: Not Completed Pneumovax: Up to Date Influenza: Not up to Date Aspirin : yes    Relevant past medical, surgical, family and social history reviewed and updated as indicated. Interim medical history since our last visit reviewed. Allergies and medications reviewed and updated.  Review of Systems  Eyes:  Negative for visual disturbance.  Respiratory:  Negative for chest tightness and shortness of breath.   Cardiovascular:  Negative for chest pain, palpitations and leg swelling.  Endocrine: Negative for polydipsia and polyuria.  Neurological:  Negative for dizziness,  light-headedness, numbness and headaches.  Psychiatric/Behavioral:  Positive for dysphoric mood. Negative for suicidal ideas. The patient is nervous/anxious.     Per HPI unless specifically indicated above     Objective:    BP 120/84   Pulse 71   Temp 98.5 F (36.9 C) (Oral)   Ht 5' 11.6 (1.819 m)   Wt 214 lb (97.1 kg)   SpO2 96%   BMI 29.35 kg/m   Wt Readings from Last 3 Encounters:  01/19/24 214 lb (97.1 kg)  09/18/23 210 lb (95.3 kg)  06/18/23 218 lb (98.9 kg)    Physical Exam Vitals and nursing note reviewed.  Constitutional:      General: He is not in acute distress.    Appearance: Normal appearance. He is not ill-appearing, toxic-appearing or diaphoretic.  HENT:     Head: Normocephalic.     Right Ear: External ear normal.     Left Ear: External ear normal.     Nose: Nose normal. No congestion or rhinorrhea.     Mouth/Throat:     Mouth: Mucous membranes are moist.  Eyes:     General:        Right eye: No discharge.        Left eye: No discharge.     Extraocular Movements: Extraocular movements intact.     Conjunctiva/sclera: Conjunctivae normal.     Pupils: Pupils are equal, round, and reactive to light.  Cardiovascular:     Rate and Rhythm: Normal rate and regular rhythm.     Heart sounds: No murmur heard. Pulmonary:     Effort: Pulmonary effort is normal. No respiratory distress.     Breath sounds: Normal breath sounds. No  wheezing, rhonchi or rales.  Abdominal:     General: Abdomen is flat. Bowel sounds are normal.  Musculoskeletal:     Cervical back: Normal range of motion and neck supple.  Skin:    General: Skin is warm and dry.     Capillary Refill: Capillary refill takes less than 2 seconds.  Neurological:     General: No focal deficit present.     Mental Status: He is alert and oriented to person, place, and time.  Psychiatric:        Mood and Affect: Mood normal.        Behavior: Behavior normal.        Thought Content: Thought content  normal.        Judgment: Judgment normal.     Results for orders placed or performed in visit on 06/18/23  Microscopic Examination   Collection Time: 06/18/23  8:29 AM   Urine  Result Value Ref Range   WBC, UA 0-5 0 - 5 /hpf   RBC, Urine 0-2 0 - 2 /hpf   Epithelial Cells (non renal) 0-10 0 - 10 /hpf   Mucus, UA Present (A) Not Estab.   Bacteria, UA None seen None seen/Few  Urinalysis, Routine w reflex microscopic   Collection Time: 06/18/23  8:29 AM  Result Value Ref Range   Specific Gravity, UA >1.030 (H) 1.005 - 1.030   pH, UA 5.5 5.0 - 7.5   Color, UA Yellow Yellow   Appearance Ur Clear Clear   Leukocytes,UA Negative Negative   Protein,UA Negative Negative/Trace   Glucose, UA Negative Negative   Ketones, UA Negative Negative   RBC, UA Trace (A) Negative   Bilirubin, UA Negative Negative   Urobilinogen, Ur 0.2 0.2 - 1.0 mg/dL   Nitrite, UA Negative Negative   Microscopic Examination See below:   Microalbumin, Urine Waived   Collection Time: 06/18/23  8:29 AM  Result Value Ref Range   Microalb, Ur Waived 80 (H) 0 - 19 mg/L   Creatinine, Urine Waived 300 10 - 300 mg/dL   Microalb/Creat Ratio 30-300 (H) <30 mg/g  TSH   Collection Time: 06/18/23  8:30 AM  Result Value Ref Range   TSH 1.870 0.450 - 4.500 uIU/mL  PSA   Collection Time: 06/18/23  8:30 AM  Result Value Ref Range   Prostate Specific Ag, Serum 0.4 0.0 - 4.0 ng/mL  Lipid panel   Collection Time: 06/18/23  8:30 AM  Result Value Ref Range   Cholesterol, Total 156 100 - 199 mg/dL   Triglycerides 868 0 - 149 mg/dL   HDL 52 >60 mg/dL   VLDL Cholesterol Cal 23 5 - 40 mg/dL   LDL Chol Calc (NIH) 81 0 - 99 mg/dL   Chol/HDL Ratio 3.0 0.0 - 5.0 ratio  CBC with Differential/Platelet   Collection Time: 06/18/23  8:30 AM  Result Value Ref Range   WBC 4.5 3.4 - 10.8 x10E3/uL   RBC 4.84 4.14 - 5.80 x10E6/uL   Hemoglobin 15.8 13.0 - 17.7 g/dL   Hematocrit 51.4 62.4 - 51.0 %   MCV 100 (H) 79 - 97 fL   MCH 32.6  26.6 - 33.0 pg   MCHC 32.6 31.5 - 35.7 g/dL   RDW 87.4 88.3 - 84.5 %   Platelets 285 150 - 450 x10E3/uL   Neutrophils 49 Not Estab. %   Lymphs 36 Not Estab. %   Monocytes 6 Not Estab. %   Eos 8 Not Estab. %  Basos 1 Not Estab. %   Neutrophils Absolute 2.2 1.4 - 7.0 x10E3/uL   Lymphocytes Absolute 1.6 0.7 - 3.1 x10E3/uL   Monocytes Absolute 0.3 0.1 - 0.9 x10E3/uL   EOS (ABSOLUTE) 0.4 0.0 - 0.4 x10E3/uL   Basophils Absolute 0.0 0.0 - 0.2 x10E3/uL   Immature Granulocytes 0 Not Estab. %   Immature Grans (Abs) 0.0 0.0 - 0.1 x10E3/uL  Comprehensive metabolic panel   Collection Time: 06/18/23  8:30 AM  Result Value Ref Range   Glucose 105 (H) 70 - 99 mg/dL   BUN 19 6 - 24 mg/dL   Creatinine, Ser 8.74 0.76 - 1.27 mg/dL   eGFR 67 >40 fO/fpw/8.26   BUN/Creatinine Ratio 15 9 - 20   Sodium 141 134 - 144 mmol/L   Potassium 4.1 3.5 - 5.2 mmol/L   Chloride 106 96 - 106 mmol/L   CO2 21 20 - 29 mmol/L   Calcium  9.2 8.7 - 10.2 mg/dL   Total Protein 6.8 6.0 - 8.5 g/dL   Albumin 4.3 3.8 - 4.9 g/dL   Globulin, Total 2.5 1.5 - 4.5 g/dL   Bilirubin Total 0.3 0.0 - 1.2 mg/dL   Alkaline Phosphatase 69 44 - 121 IU/L   AST 41 (H) 0 - 40 IU/L   ALT 39 0 - 44 IU/L  HgB A1c   Collection Time: 06/18/23  8:30 AM  Result Value Ref Range   Hgb A1c MFr Bld 5.8 (H) 4.8 - 5.6 %   Est. average glucose Bld gHb Est-mCnc 120 mg/dL      Assessment & Plan:   Problem List Items Addressed This Visit       Cardiovascular and Mediastinum   Hypertension   Chronic.  Controlled.  Continue with current medication regimen of Amlodipine , Valsartan  and Metoprolol .  Refills sent today.  Recommend checking blood pressures at home and bringing log to next visit.  Labs ordered today.  Return to clinic in 6 months for reevaluation.  Call sooner if concerns arise.       Relevant Medications   valsartan  (DIOVAN ) 80 MG tablet   metoprolol  succinate (TOPROL -XL) 25 MG 24 hr tablet     Endocrine   Type 2 diabetes mellitus  with diabetic neuropathy, unspecified (HCC) - Primary   Chronic.  Controlled.  Last A1c in December was 5.8%.  Will increase Mounjaro  to 5mg  due to 2.5mg  only being the starting dose.   Labs ordered today.  Return to clinic in 6 months for reevaluation.  Call sooner if concerns arise.        Relevant Medications   dapagliflozin  propanediol (FARXIGA ) 10 MG TABS tablet   valsartan  (DIOVAN ) 80 MG tablet   tirzepatide  (MOUNJARO ) 5 MG/0.5ML Pen   Other Relevant Orders   Comprehensive metabolic panel with GFR   Hemoglobin A1c     Other   Hyperlipidemia   Chronic.  Controlled.  Continue with current medication regimen of atorvastatin  40mg  daily.  Refills sent today.  Labs ordered today.  Return to clinic in 6 months for reevaluation.  Call sooner if concerns arise.       Relevant Medications   valsartan  (DIOVAN ) 80 MG tablet   metoprolol  succinate (TOPROL -XL) 25 MG 24 hr tablet   Other Relevant Orders   Lipid panel   Major depression, recurrent (HCC)   Chronic. Not currently on medication.  Does not feel like he needs it.  Does not want a referral to psychiatry at this time.  Bipolar disorder (HCC)   Chronic. Not currently on medication.  Does not feel like he needs it.  Does not want a referral to psychiatry at this time.        Morbid obesity (HCC)   Recommended eating smaller high protein, low fat meals more frequently and exercising 30 mins a day 5 times a week with a goal of 10-15lb weight loss in the next 3 months.       Relevant Medications   dapagliflozin  propanediol (FARXIGA ) 10 MG TABS tablet   tirzepatide  (MOUNJARO ) 5 MG/0.5ML Pen   Other Visit Diagnoses       Encounter for hepatitis C screening test for low risk patient       Relevant Orders   Hepatitis C antibody          Follow up plan: Return in about 6 months (around 07/21/2024) for Physical and Fasting labs.

## 2024-01-19 NOTE — Assessment & Plan Note (Signed)
 Recommended eating smaller high protein, low fat meals more frequently and exercising 30 mins a day 5 times a week with a goal of 10-15lb weight loss in the next 3 months.

## 2024-01-19 NOTE — Telephone Encounter (Signed)
 Patient did not go to the lab.  Please call him and have him come back for a lab appointment to make sure we are able to continue to fill his medications.

## 2024-01-19 NOTE — Assessment & Plan Note (Signed)
Chronic. Not currently on medication.  Does not feel like he needs it.  Does not want a referral to psychiatry at this time.   

## 2024-01-19 NOTE — Assessment & Plan Note (Signed)
 Chronic.  Controlled.  Last A1c in December was 5.8%.  Will increase Mounjaro  to 5mg  due to 2.5mg  only being the starting dose.   Labs ordered today.  Return to clinic in 6 months for reevaluation.  Call sooner if concerns arise.

## 2024-01-19 NOTE — Assessment & Plan Note (Signed)
 Chronic.  Controlled.  Continue with current medication regimen of Amlodipine , Valsartan  and Metoprolol .  Refills sent today.  Recommend checking blood pressures at home and bringing log to next visit.  Labs ordered today.  Return to clinic in 6 months for reevaluation.  Call sooner if concerns arise.

## 2024-01-19 NOTE — Assessment & Plan Note (Signed)
 Chronic.  Controlled.  Continue with current medication regimen of atorvastatin 40mg  daily.  Refills sent today.  Labs ordered today.  Return to clinic in 6 months for reevaluation.  Call sooner if concerns arise.

## 2024-01-19 NOTE — Telephone Encounter (Signed)
 Called and LVM asking for patient to please return my call.   OK for E2C2 to speak to patient and ask him to come back by the office to do his lab work.

## 2024-01-20 NOTE — Telephone Encounter (Signed)
 Called and LVM asking for patient to please return my call.   OK for E2C2 to speak to patient and ask him to come back by the office to do his lab work.

## 2024-01-25 ENCOUNTER — Other Ambulatory Visit: Payer: MEDICAID

## 2024-02-20 IMAGING — CR DG CHEST 2V
1 series · 2 of 2 positions shown · non-contrast
Comparison: 11/04/2019

CLINICAL DATA: Shortness of breath

EXAM:
CHEST - 2 VIEW

[Series 1: dg chest 2 view · 0.14mm/px · 2 of 2 slices shown]
[im 1/2]
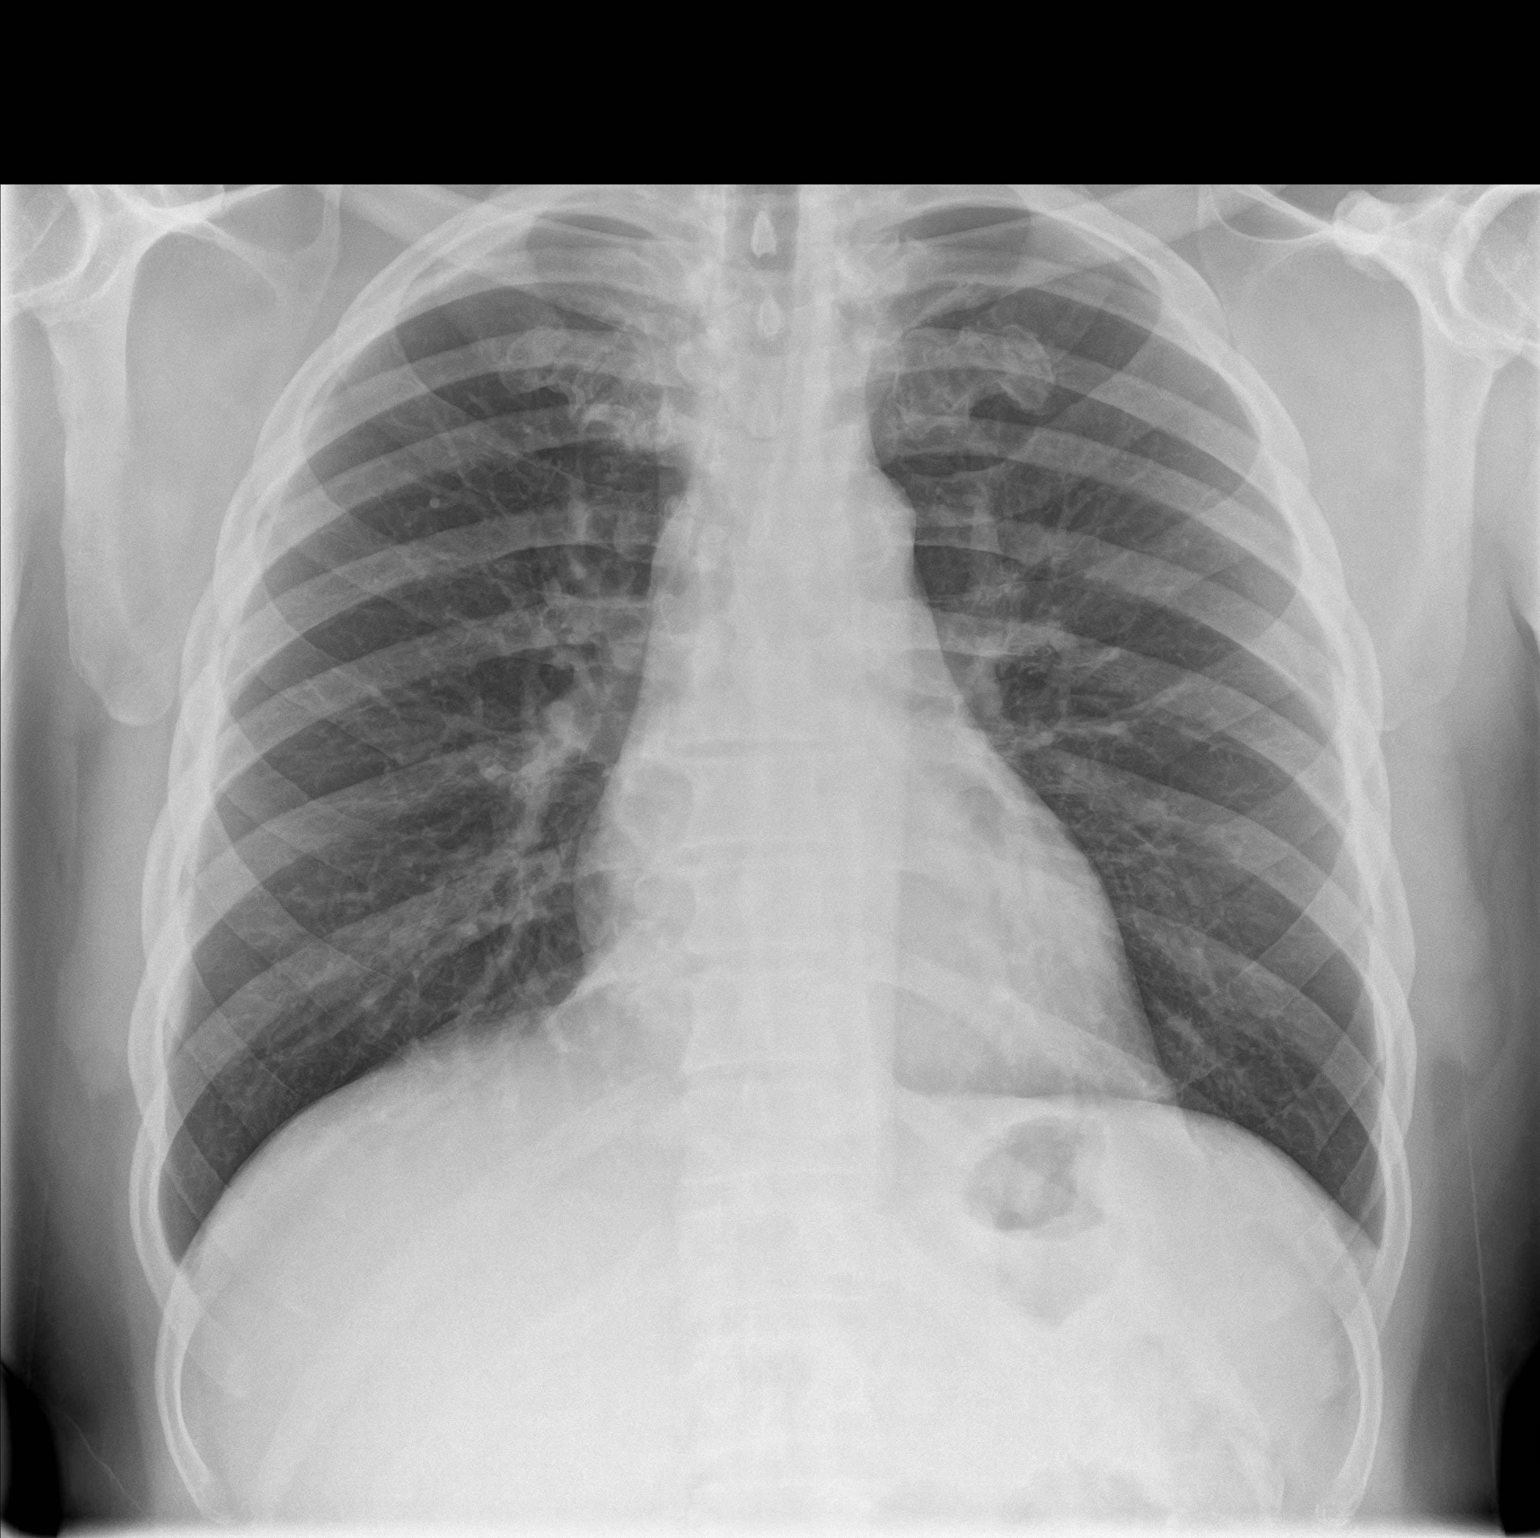
[im 2/2]
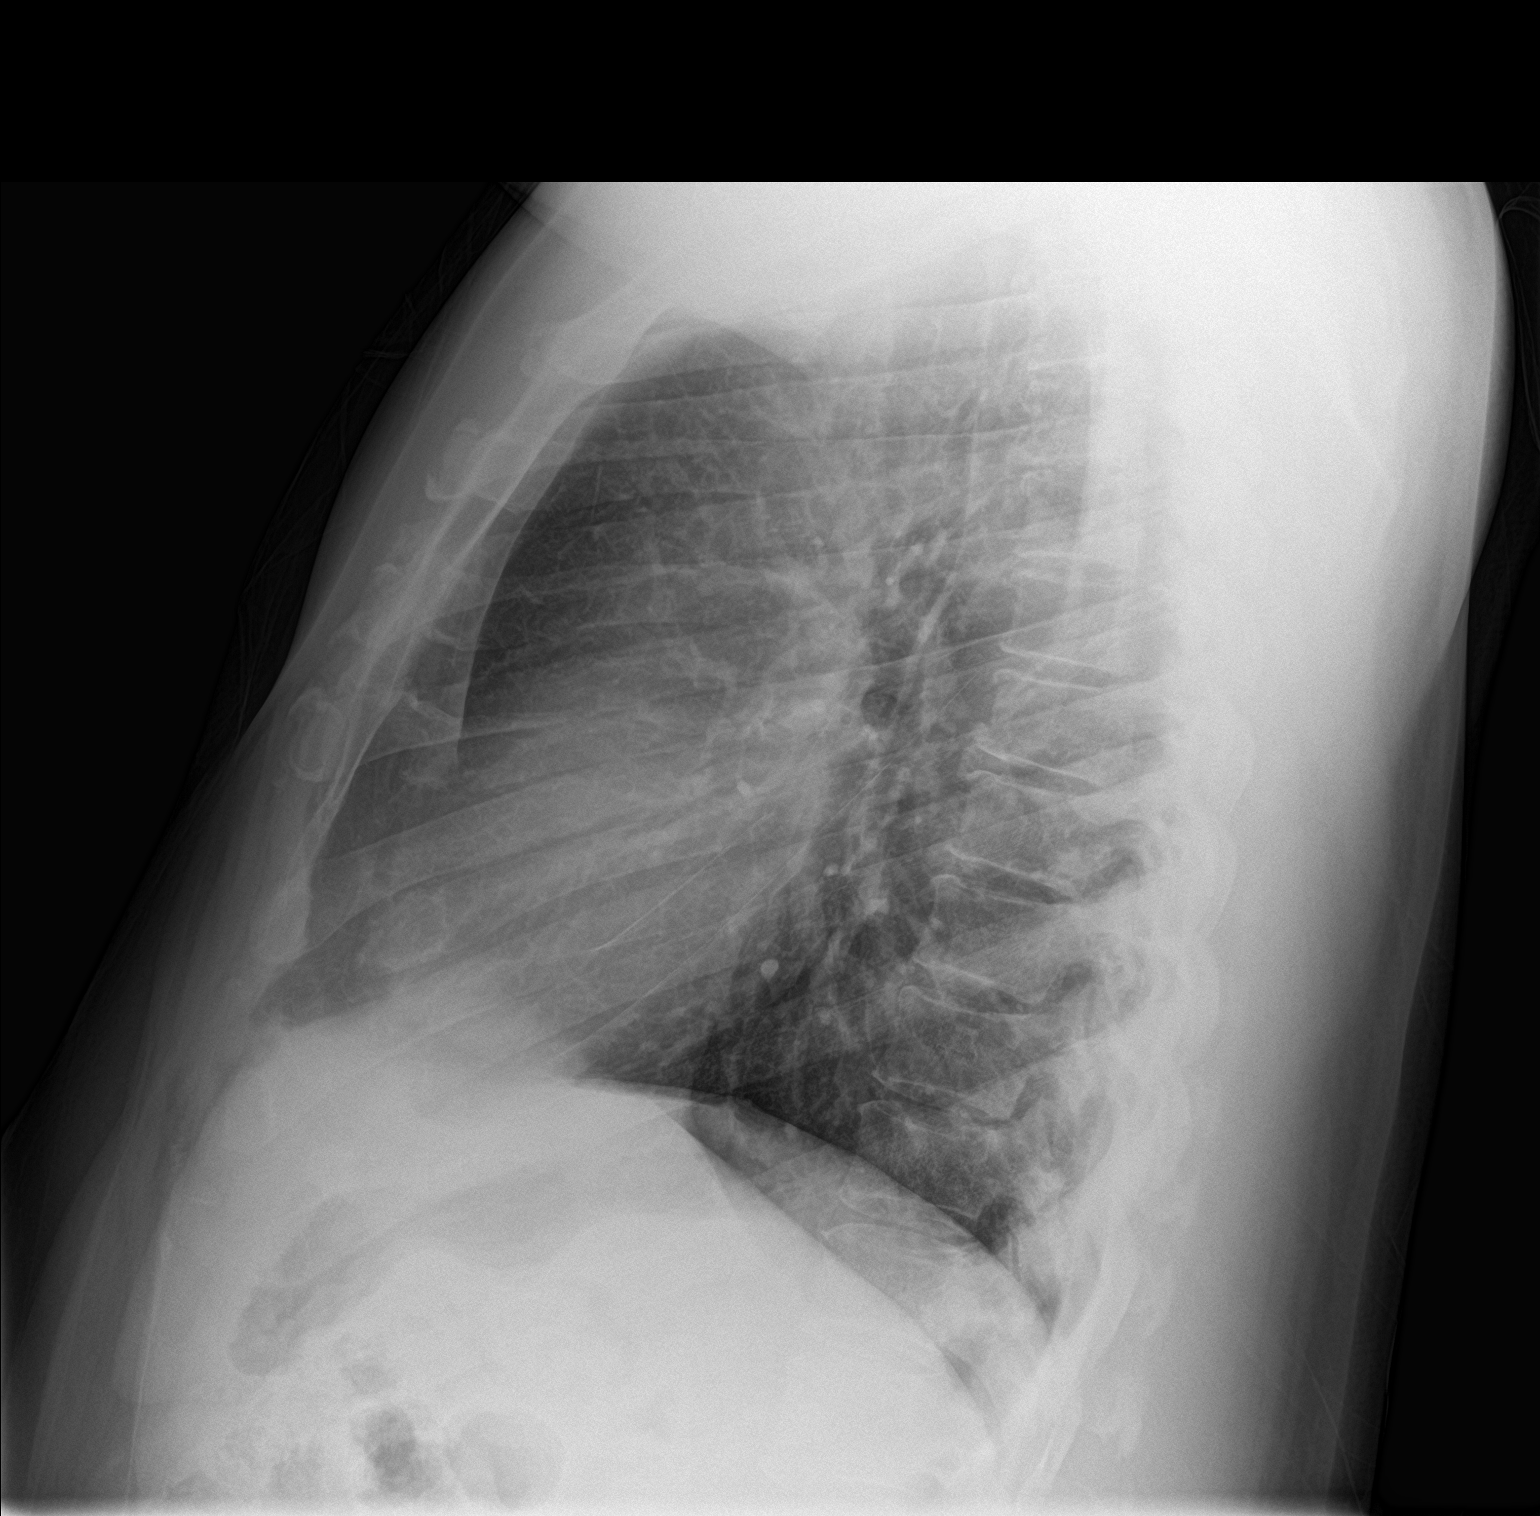

[2 of 2 positions shown; findings below may reference images not displayed]

FINDINGS: The heart size and mediastinal contours are within normal limits.
Both lungs are clear. The visualized skeletal structures are
unremarkable.
IMPRESSION: No active cardiopulmonary disease.

## 2024-07-23 ENCOUNTER — Other Ambulatory Visit: Payer: Self-pay | Admitting: Nurse Practitioner

## 2024-07-25 ENCOUNTER — Other Ambulatory Visit: Payer: Self-pay | Admitting: Nurse Practitioner

## 2024-07-26 ENCOUNTER — Encounter: Payer: MEDICAID | Admitting: Nurse Practitioner

## 2024-07-26 NOTE — Telephone Encounter (Signed)
 Requested medication (s) are due for refill today: yes  Requested medication (s) are on the active medication list: yes  Last refill:  01/19/24 6 ml 1 RF  Future visit scheduled: yes  Notes to clinic:  med not assigned to a protocol   Requested Prescriptions  Pending Prescriptions Disp Refills   MOUNJARO  5 MG/0.5ML Pen [Pharmacy Med Name: MOUNJARO  5 MG/0.5ML SUBQ SOLN ML] 6 mL 1    Sig: INJECT 5MG  SUBCUTANEOUSLY ONCE A WEEK     Off-Protocol Failed - 07/26/2024  7:51 AM      Failed - Medication not assigned to a protocol, review manually.      Passed - Valid encounter within last 12 months    Recent Outpatient Visits           6 months ago Type 2 diabetes mellitus with diabetic neuropathy, with long-term current use of insulin  [facility])   [facility] Melvin Pao, NP   10 months ago Annual physical exam   [facility] Melvin Pao, NP

## 2024-07-27 NOTE — Telephone Encounter (Signed)
 Attempted to reach patient to schedule his needed physical that was missed due to weather. If/when he returns call please assist with getting that scheduled with his PCP. Also please be sure to update his demographics information for himself and for his emergency contract. FYI says we can leave detailed message for him but the number there listed is what's listed for his emergency contact. The current number on fie for him is not in service.

## 2024-07-27 NOTE — Telephone Encounter (Signed)
 Called to get appt reschedule, but phone number listed in the chart is not a working number.

## 2024-07-29 ENCOUNTER — Ambulatory Visit: Payer: MEDICAID | Admitting: Nurse Practitioner

## 2024-07-29 ENCOUNTER — Encounter: Payer: Self-pay | Admitting: Nurse Practitioner

## 2024-07-29 VITALS — BP 167/99 | HR 61 | Temp 98.0°F | Ht 71.61 in | Wt 219.6 lb

## 2024-07-29 DIAGNOSIS — I10 Essential (primary) hypertension: Secondary | ICD-10-CM

## 2024-07-29 DIAGNOSIS — Z794 Long term (current) use of insulin: Secondary | ICD-10-CM

## 2024-07-29 DIAGNOSIS — F3132 Bipolar disorder, current episode depressed, moderate: Secondary | ICD-10-CM

## 2024-07-29 DIAGNOSIS — Z1159 Encounter for screening for other viral diseases: Secondary | ICD-10-CM

## 2024-07-29 DIAGNOSIS — E782 Mixed hyperlipidemia: Secondary | ICD-10-CM

## 2024-07-29 DIAGNOSIS — E1140 Type 2 diabetes mellitus with diabetic neuropathy, unspecified: Secondary | ICD-10-CM

## 2024-07-29 LAB — MICROALBUMIN, URINE WAIVED
Creatinine, Urine Waived: 300 mg/dL (ref 10–300)
Microalb, Ur Waived: 80 mg/L — ABNORMAL HIGH (ref 0–19)

## 2024-07-29 MED ORDER — VALSARTAN 160 MG PO TABS: 160.0000 mg | ORAL_TABLET | Freq: Every day | ORAL | 1 refills | Status: AC

## 2024-07-29 MED ORDER — ULTRACARE PEN NEEDLES 32G X 4 MM MISC: 1.0000 | Freq: Three times a day (TID) | 11 refills | Status: AC | PRN

## 2024-07-29 MED ORDER — ASPIRIN EC 81 MG PO TBEC: 81.0000 mg | DELAYED_RELEASE_TABLET | Freq: Every day | ORAL | 0 refills | Status: AC

## 2024-07-29 MED ORDER — ACCU-CHEK SOFTCLIX LANCETS MISC: 1.0000 | 2 refills | Status: AC

## 2024-07-29 MED ORDER — DAPAGLIFLOZIN PROPANEDIOL 10 MG PO TABS: 10.0000 mg | ORAL_TABLET | Freq: Every day | ORAL | 1 refills | Status: AC

## 2024-07-29 MED ORDER — METOPROLOL SUCCINATE ER 25 MG PO TB24: 25.0000 mg | ORAL_TABLET | Freq: Every day | ORAL | 1 refills | Status: AC

## 2024-07-29 MED ORDER — ALBUTEROL SULFATE HFA 108 (90 BASE) MCG/ACT IN AERS: 2.0000 | INHALATION_SPRAY | Freq: Four times a day (QID) | RESPIRATORY_TRACT | 3 refills | Status: AC | PRN

## 2024-07-29 MED ORDER — ATORVASTATIN CALCIUM 40 MG PO TABS: 40.0000 mg | ORAL_TABLET | Freq: Every day | ORAL | 1 refills | Status: AC

## 2024-07-29 MED ORDER — ACCU-CHEK AVIVA PLUS VI STRP: ORAL_STRIP | 2 refills | Status: AC

## 2024-07-29 MED ORDER — TIRZEPATIDE 5 MG/0.5ML ~~LOC~~ SOAJ: 5.0000 mg | SUBCUTANEOUS | 1 refills | Status: AC

## 2024-07-29 MED ORDER — POLYETHYLENE GLYCOL 3350 17 GM/SCOOP PO POWD: 17.0000 g | Freq: Two times a day (BID) | ORAL | 1 refills | Status: AC | PRN

## 2024-07-29 MED ORDER — ACCU-CHEK AVIVA PLUS W/DEVICE KIT: 1.0000 | PACK | Freq: Every day | 0 refills | Status: AC

## 2024-07-29 MED ORDER — CITALOPRAM HYDROBROMIDE 20 MG PO TABS: 20.0000 mg | ORAL_TABLET | Freq: Every day | ORAL | 1 refills | Status: AC

## 2024-07-29 NOTE — Assessment & Plan Note (Signed)
 Chronic.  Controlled.  Continue with current medication regimen of atorvastatin 40mg  daily.  Refills sent today.  Labs ordered today.  Return to clinic in 6 months for reevaluation.  Call sooner if concerns arise.

## 2024-07-29 NOTE — Assessment & Plan Note (Signed)
 Chronic.  Not having manic episodes but struggles with anxiety.  He is not currently taking any medication.  Will restart Citalopram  to help with anxiety.  Follow up in 1 month.  Call sooner if concerns arise.

## 2024-07-29 NOTE — Assessment & Plan Note (Signed)
 Chronic. Elevated at visit today.  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 However, suspect it is more than that at this time.  Will increase Valsartan  to 160mg  daily.  Recommend checking blood pressures at home. Bring log to next visit.  Follow up in 1 month.

## 2024-07-29 NOTE — Progress Notes (Signed)
 "  BP (!) 167/99 (BP Location: Right Arm, Cuff Size: Large)   Pulse 61   Temp 98 F (36.7 C) (Oral)   Ht 5' 11.61 (1.819 m)   Wt 219 lb 9.6 oz (99.6 kg)   SpO2 99%   BMI 30.11 kg/m    Subjective:    Patient ID: Lance Vaughan, male    DOB: 1966-03-22, 59 y.o.   MRN: [id number]  HPI: Lance Vaughan is a 59 y.o. male  Chief Complaint  Patient presents with   Medication Refill    Patient stated he would like everything written for 3 months if possible because they charge more when it's not written all the same   Hand Pain    Patient stated his right hand has been numb for 8-9 months, he cannot hold anything with it, he can move it though.    HYPERTENSION / HYPERLIPIDEMIA Satisfied with current treatment? no Duration of hypertension: years BP monitoring frequency: not checking BP range:  BP medication side effects: no Past BP meds: amlodipine  Duration of hyperlipidemia: years Cholesterol medication side effects: no Cholesterol supplements: none Past cholesterol medications: atorvastain (lipitor) Medication compliance: excellent compliance Aspirin : yes Recent stressors: no Recurrent headaches: no Visual changes: no Palpitations: no Dyspnea: yes Chest pain: no Lower extremity edema: no Dizzy/lightheaded: no  DIABETES Taking Mounjaro  5mg .  No side effects.   Hypoglycemic episodes:no Polydipsia/polyuria: no Visual disturbance: no Chest pain: no Paresthesias: yes Glucose Monitoring: yes  Accucheck frequency: Daily  Fasting glucose: 116-120  Post prandial:  Evening:  Before meals: Taking Insulin ?: no  Long acting insulin :  Short acting insulin : Blood Pressure Monitoring: daily Retinal Examination: Not up to Date Foot Exam: Up to Date Diabetic Education: Not Completed Pneumovax: Up to Date Influenza: Not up to Date Aspirin : yes  Patient states he has been having numbness in his right hand that has been going on for 8-9 months.  Patient states it is getting worse.   He can't even feel money dropping out of his hand.    MOOD Patient states his mood has been good.  He feels like it is bad because of the numbness in his hand.  He struggles with anxiety.  Does not believe he is taking Citalopram .  Denies SI.     Flowsheet Row Office Visit from 07/29/2024 in [facility]  PHQ-9 Total Score 9      07/29/2024   10:46 AM 04/15/2022    1:12 PM 07/24/2021   11:09 AM 04/15/2018    2:27 PM  GAD 7 : Generalized Anxiety Score  Nervous, Anxious, on Edge 3 3  --  3   Control/stop worrying 3 1   3    Worry too much - different things 2 3   3    Trouble relaxing 3 3   3    Restless 3 3   3    Easily annoyed or irritable 3 3   3    Afraid - awful might happen 1 1   3    Total GAD 7 Score 18 17  21   Anxiety Difficulty Somewhat difficult Somewhat difficult       Data saved with a previous flowsheet row definition      Relevant past medical, surgical, family and social history reviewed and updated as indicated. Interim medical history since our last visit reviewed. Allergies and medications reviewed and updated.  Review of Systems  Eyes:  Negative for visual disturbance.  Respiratory:  Negative for chest tightness and shortness of breath.  Cardiovascular:  Negative for chest pain, palpitations and leg swelling.  Endocrine: Negative for polydipsia and polyuria.  Neurological:  Positive for numbness. Negative for dizziness, light-headedness and headaches.  Psychiatric/Behavioral:  Positive for dysphoric mood. Negative for suicidal ideas. The patient is nervous/anxious.     Per HPI unless specifically indicated above     Objective:    BP (!) 167/99 (BP Location: Right Arm, Cuff Size: Large)   Pulse 61   Temp 98 F (36.7 C) (Oral)   Ht 5' 11.61 (1.819 m)   Wt 219 lb 9.6 oz (99.6 kg)   SpO2 99%   BMI 30.11 kg/m   Wt Readings from Last 3 Encounters:  07/29/24 219 lb 9.6 oz (99.6 kg)  01/19/24 214 lb (97.1 kg)  09/18/23 210 lb (95.3  kg)    Physical Exam Vitals and nursing note reviewed.  Constitutional:      General: He is not in acute distress.    Appearance: Normal appearance. He is not ill-appearing, toxic-appearing or diaphoretic.  HENT:     Head: Normocephalic.     Right Ear: External ear normal.     Left Ear: External ear normal.     Nose: Nose normal. No congestion or rhinorrhea.     Mouth/Throat:     Mouth: Mucous membranes are moist.  Eyes:     General:        Right eye: No discharge.        Left eye: No discharge.     Extraocular Movements: Extraocular movements intact.     Conjunctiva/sclera: Conjunctivae normal.     Pupils: Pupils are equal, round, and reactive to light.  Cardiovascular:     Rate and Rhythm: Normal rate and regular rhythm.     Heart sounds: No murmur heard. Pulmonary:     Effort: Pulmonary effort is normal. No respiratory distress.     Breath sounds: Normal breath sounds. No wheezing, rhonchi or rales.  Abdominal:     General: Abdomen is flat. Bowel sounds are normal.  Musculoskeletal:     Cervical back: Normal range of motion and neck supple.  Skin:    General: Skin is warm and dry.     Capillary Refill: Capillary refill takes less than 2 seconds.  Neurological:     General: No focal deficit present.     Mental Status: He is alert and oriented to person, place, and time.  Psychiatric:        Mood and Affect: Mood normal.        Behavior: Behavior normal.        Thought Content: Thought content normal.        Judgment: Judgment normal.     Results for orders placed or performed in visit on 06/18/23  Microscopic Examination   Collection Time: 06/18/23  8:29 AM   Urine  Result Value Ref Range   WBC, UA 0-5 0 - 5 /hpf   RBC, Urine 0-2 0 - 2 /hpf   Epithelial Cells (non renal) 0-10 0 - 10 /hpf   Mucus, UA Present (A) Not Estab.   Bacteria, UA None seen None seen/Few  Urinalysis, Routine w reflex microscopic   Collection Time: 06/18/23  8:29 AM  Result Value Ref  Range   Specific Gravity, UA >1.030 (H) 1.005 - 1.030   pH, UA 5.5 5.0 - 7.5   Color, UA Yellow Yellow   Appearance Ur Clear Clear   Leukocytes,UA Negative Negative   Protein,UA Negative Negative/Trace  Glucose, UA Negative Negative   Ketones, UA Negative Negative   RBC, UA Trace (A) Negative   Bilirubin, UA Negative Negative   Urobilinogen, Ur 0.2 0.2 - 1.0 mg/dL   Nitrite, UA Negative Negative   Microscopic Examination See below:   Microalbumin, Urine Waived   Collection Time: 06/18/23  8:29 AM  Result Value Ref Range   Microalb, Ur Waived 80 (H) 0 - 19 mg/L   Creatinine, Urine Waived 300 10 - 300 mg/dL   Microalb/Creat Ratio 30-300 (H) <30 mg/g  TSH   Collection Time: 06/18/23  8:30 AM  Result Value Ref Range   TSH 1.870 0.450 - 4.500 uIU/mL  PSA   Collection Time: 06/18/23  8:30 AM  Result Value Ref Range   Prostate Specific Ag, Serum 0.4 0.0 - 4.0 ng/mL  Lipid panel   Collection Time: 06/18/23  8:30 AM  Result Value Ref Range   Cholesterol, Total 156 100 - 199 mg/dL   Triglycerides 868 0 - 149 mg/dL   HDL 52 >60 mg/dL   VLDL Cholesterol Cal 23 5 - 40 mg/dL   LDL Chol Calc (NIH) 81 0 - 99 mg/dL   Chol/HDL Ratio 3.0 0.0 - 5.0 ratio  CBC with Differential/Platelet   Collection Time: 06/18/23  8:30 AM  Result Value Ref Range   WBC 4.5 3.4 - 10.8 x10E3/uL   RBC 4.84 4.14 - 5.80 x10E6/uL   Hemoglobin 15.8 13.0 - 17.7 g/dL   Hematocrit 51.4 62.4 - 51.0 %   MCV 100 (H) 79 - 97 fL   MCH 32.6 26.6 - 33.0 pg   MCHC 32.6 31.5 - 35.7 g/dL   RDW 87.4 88.3 - 84.5 %   Platelets 285 150 - 450 x10E3/uL   Neutrophils 49 Not Estab. %   Lymphs 36 Not Estab. %   Monocytes 6 Not Estab. %   Eos 8 Not Estab. %   Basos 1 Not Estab. %   Neutrophils Absolute 2.2 1.4 - 7.0 x10E3/uL   Lymphocytes Absolute 1.6 0.7 - 3.1 x10E3/uL   Monocytes Absolute 0.3 0.1 - 0.9 x10E3/uL   EOS (ABSOLUTE) 0.4 0.0 - 0.4 x10E3/uL   Basophils Absolute 0.0 0.0 - 0.2 x10E3/uL   Immature Granulocytes 0  Not Estab. %   Immature Grans (Abs) 0.0 0.0 - 0.1 x10E3/uL  Comprehensive metabolic panel   Collection Time: 06/18/23  8:30 AM  Result Value Ref Range   Glucose 105 (H) 70 - 99 mg/dL   BUN 19 6 - 24 mg/dL   Creatinine, Ser 8.74 0.76 - 1.27 mg/dL   eGFR 67 >40 fO/fpw/8.26   BUN/Creatinine Ratio 15 9 - 20   Sodium 141 134 - 144 mmol/L   Potassium 4.1 3.5 - 5.2 mmol/L   Chloride 106 96 - 106 mmol/L   CO2 21 20 - 29 mmol/L   Calcium  9.2 8.7 - 10.2 mg/dL   Total Protein 6.8 6.0 - 8.5 g/dL   Albumin 4.3 3.8 - 4.9 g/dL   Globulin, Total 2.5 1.5 - 4.5 g/dL   Bilirubin Total 0.3 0.0 - 1.2 mg/dL   Alkaline Phosphatase 69 44 - 121 IU/L   AST 41 (H) 0 - 40 IU/L   ALT 39 0 - 44 IU/L  HgB A1c   Collection Time: 06/18/23  8:30 AM  Result Value Ref Range   Hgb A1c MFr Bld 5.8 (H) 4.8 - 5.6 %   Est. average glucose Bld gHb Est-mCnc 120 mg/dL      Assessment &  Plan:   Problem List Items Addressed This Visit       Cardiovascular and Mediastinum   Hypertension   Chronic. Elevated at visit today.  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 However, suspect it is more than that at this time.  Will increase Valsartan  to 160mg  daily.  Recommend checking blood pressures at home. Bring log to next visit.  Follow up in 1 month.      Relevant Medications   aspirin  EC 81 MG tablet   atorvastatin  (LIPITOR) 40 MG tablet   metoprolol  succinate (TOPROL -XL) 25 MG 24 hr tablet   valsartan  (DIOVAN ) 160 MG tablet   Other Relevant Orders   Comprehensive metabolic panel with GFR     Endocrine   Type 2 diabetes mellitus with diabetic neuropathy, unspecified (HCC) - Primary   Chronic. Not well controlled. Neuropathy is worsening per patient.  Will refer to Neurology for evaluation and possible EMG study.  Follow up in 3 months.  Call sooner if concerns arise.       Relevant Medications   Accu-Chek Softclix Lancets lancets   aspirin  EC 81 MG tablet   atorvastatin  (LIPITOR) 40 MG tablet   Blood Glucose Monitoring  Suppl (ACCU-CHEK AVIVA PLUS) w/Device KIT   dapagliflozin  propanediol (FARXIGA ) 10 MG TABS tablet   tirzepatide  (MOUNJARO ) 5 MG/0.5ML Pen   valsartan  (DIOVAN ) 160 MG tablet   Other Relevant Orders   Ambulatory referral to Neurology   Hemoglobin A1c   Microalbumin, Urine Waived     Other   Hyperlipidemia   Chronic.  Controlled.  Continue with current medication regimen of atorvastatin  40mg  daily.  Refills sent today.  Labs ordered today.  Return to clinic in 6 months for reevaluation.  Call sooner if concerns arise.       Relevant Medications   aspirin  EC 81 MG tablet   atorvastatin  (LIPITOR) 40 MG tablet   metoprolol  succinate (TOPROL -XL) 25 MG 24 hr tablet   valsartan  (DIOVAN ) 160 MG tablet   Other Relevant Orders   Lipid panel   Bipolar disorder (HCC)   Chronic.  Not having manic episodes but struggles with anxiety.  He is not currently taking any medication.  Will restart Citalopram  to help with anxiety.  Follow up in 1 month.  Call sooner if concerns arise.       RESOLVED: Morbid obesity (HCC)   Relevant Medications   dapagliflozin  propanediol (FARXIGA ) 10 MG TABS tablet   tirzepatide  (MOUNJARO ) 5 MG/0.5ML Pen   Other Visit Diagnoses       Encounter for hepatitis C screening test for low risk patient       Relevant Orders   Hepatitis C antibody          Follow up plan: Return in about 1 month (around 08/26/2024) for BP Check.      "

## 2024-07-29 NOTE — Assessment & Plan Note (Signed)
 Chronic. Not well controlled. Neuropathy is worsening per patient.  Will refer to Neurology for evaluation and possible EMG study.  Follow up in 3 months.  Call sooner if concerns arise.

## 2024-08-29 ENCOUNTER — Ambulatory Visit: Payer: MEDICAID | Admitting: Nurse Practitioner
